# Patient Record
Sex: Female | Born: 1945 | Race: White | Hispanic: No | Marital: Married | Smoking: Never smoker
Health system: Southern US, Community
[De-identification: ages and names within clinical notes are randomized; demographics above are authoritative.]

## PROBLEM LIST (undated history)

## (undated) DIAGNOSIS — J189 Pneumonia, unspecified organism: Secondary | ICD-10-CM

## (undated) DIAGNOSIS — I10 Essential (primary) hypertension: Secondary | ICD-10-CM

## (undated) DIAGNOSIS — U071 COVID-19: Secondary | ICD-10-CM

## (undated) HISTORY — DX: Essential (primary) hypertension: I10

## (undated) HISTORY — PX: TOTAL KNEE ARTHROPLASTY: SHX125

---

## 2003-04-01 ENCOUNTER — Ambulatory Visit (HOSPITAL_COMMUNITY): Admission: RE | Admit: 2003-04-01 | Discharge: 2003-04-01 | Payer: Self-pay | Admitting: Family Medicine

## 2003-04-01 ENCOUNTER — Encounter: Payer: Self-pay | Admitting: Family Medicine

## 2004-01-15 ENCOUNTER — Ambulatory Visit (HOSPITAL_COMMUNITY): Admission: RE | Admit: 2004-01-15 | Discharge: 2004-01-16 | Payer: Self-pay | Admitting: Otolaryngology

## 2004-08-18 ENCOUNTER — Ambulatory Visit (HOSPITAL_COMMUNITY): Admission: RE | Admit: 2004-08-18 | Discharge: 2004-08-18 | Payer: Self-pay | Admitting: Family Medicine

## 2004-11-07 ENCOUNTER — Other Ambulatory Visit: Admission: RE | Admit: 2004-11-07 | Discharge: 2004-11-07 | Payer: Self-pay | Admitting: Family Medicine

## 2005-09-19 ENCOUNTER — Ambulatory Visit (HOSPITAL_COMMUNITY): Admission: RE | Admit: 2005-09-19 | Discharge: 2005-09-19 | Payer: Self-pay | Admitting: Family Medicine

## 2005-12-19 ENCOUNTER — Other Ambulatory Visit: Admission: RE | Admit: 2005-12-19 | Discharge: 2005-12-19 | Payer: Self-pay | Admitting: Family Medicine

## 2006-10-12 ENCOUNTER — Ambulatory Visit (HOSPITAL_COMMUNITY): Admission: RE | Admit: 2006-10-12 | Discharge: 2006-10-12 | Payer: Self-pay | Admitting: Family Medicine

## 2007-01-01 ENCOUNTER — Other Ambulatory Visit: Admission: RE | Admit: 2007-01-01 | Discharge: 2007-01-01 | Payer: Self-pay | Admitting: Family Medicine

## 2007-10-16 ENCOUNTER — Ambulatory Visit (HOSPITAL_COMMUNITY): Admission: RE | Admit: 2007-10-16 | Discharge: 2007-10-16 | Payer: Self-pay | Admitting: Family Medicine

## 2008-03-13 ENCOUNTER — Other Ambulatory Visit: Admission: RE | Admit: 2008-03-13 | Discharge: 2008-03-13 | Payer: Self-pay | Admitting: Family Medicine

## 2008-11-09 ENCOUNTER — Ambulatory Visit (HOSPITAL_COMMUNITY): Admission: RE | Admit: 2008-11-09 | Discharge: 2008-11-09 | Payer: Self-pay | Admitting: Family Medicine

## 2009-04-06 ENCOUNTER — Other Ambulatory Visit: Admission: RE | Admit: 2009-04-06 | Discharge: 2009-04-06 | Payer: Self-pay | Admitting: Family Medicine

## 2009-11-10 ENCOUNTER — Ambulatory Visit (HOSPITAL_COMMUNITY): Admission: RE | Admit: 2009-11-10 | Discharge: 2009-11-10 | Payer: Self-pay | Admitting: Family Medicine

## 2010-05-17 ENCOUNTER — Other Ambulatory Visit: Admission: RE | Admit: 2010-05-17 | Discharge: 2010-05-17 | Payer: Self-pay | Admitting: Family Medicine

## 2010-11-21 ENCOUNTER — Other Ambulatory Visit (HOSPITAL_COMMUNITY): Payer: Self-pay | Admitting: Family Medicine

## 2010-11-21 DIAGNOSIS — Z1231 Encounter for screening mammogram for malignant neoplasm of breast: Secondary | ICD-10-CM

## 2010-11-29 ENCOUNTER — Ambulatory Visit (HOSPITAL_COMMUNITY)
Admission: RE | Admit: 2010-11-29 | Discharge: 2010-11-29 | Disposition: A | Discharge status: Home / Self Care | Payer: BC Managed Care – PPO | Source: Ambulatory Visit | Attending: Family Medicine | Admitting: Family Medicine

## 2010-11-29 DIAGNOSIS — Z1231 Encounter for screening mammogram for malignant neoplasm of breast: Secondary | ICD-10-CM | POA: Insufficient documentation

## 2011-02-03 NOTE — Op Note (Signed)
NAME:  Tracy Young, Tracy Young                          ACCOUNT NO.:  [id number]   MEDICAL RECORD NO.:  [id number]                   PATIENT TYPE:  OIB   LOCATION:  5707                                 FACILITY:  [facility]   PHYSICIAN:  Beulah Gandy. Ashley Royalty, M.D.              DATE OF BIRTH:  1946-01-10   DATE OF PROCEDURE:  01/15/2004  DATE OF DISCHARGE:  01/16/2004                                 OPERATIVE REPORT   ADMISSION DIAGNOSIS:  Rhegmatogenous retinal detachment, left eye.  Vitreous hemorrhage, left eye.   PROCEDURES:  1. Scleral buckle, with pars plana vitrectomy.  2. Retinal photocoagulation.  3. Perfluoron injection.  4. Perfluoron removal.  5. Membrane peel.  6. Perfluoron propane injection.  All for left eye.   SURGEON:  Beulah Gandy. Ashley Royalty, M.D.   ASSISTANT:  Bryan Lemma. Lundquist, P.A.   ANESTHESIA:  General.   OPERATIVE DETAILS:  The usual prep and drape, 360 limbal peritomy, isolation  of four rectus muscles on 2-0 silk, sterile dissection for 360 degrees to  admit a 279 intrascleral implant.  Diathermy placed in the bed.  Two sutures  per quad replaced in the scleral flaps.  Additional posterior dissection was  carried out beneath the break at 9 o'clock.  Additional diathermy was placed  in this area.  A 508-G radial segment was placed at 9 o'clock.  The 279  implant was placed with a joint at 1 o'clock.  The 240 band was placed in  the 270 sleeve at 12 o'clock.   Attention was then carried to the pars plana area, where preparation for  vitrectomy was performed.  The 4 mm fusion port was anchored into place at 4  o'clock.  The lighted Pickett cutter was placed at 10 and 2 o'clock  respectively.  Pars plana vitrectomy was begun just behind the crystalline  lens.  The ProDisc was placed on the corneal surface and the Biom was moved  into place.  The vitrectomy was performed by removing all vitreous from  vitreous cavity.  There was vitreous adherent to the edges of the tear,  and  there were blood vessels into the ends of the tears.  Endo cautery was used  to cauterize the torn edges of the retina, where blood vessels were  bleeding.  Once this was accomplished, the Perfluoron was injected into the  eye to reattach the retina; it came up to the edge of the posterior tear.  Then gas was injected in a sandwich maneuver to flatten the retina on the  scleral buckle.   At this point, the indirect ophthalmoscope laser was moved into place; 370  burns were placed on the scleral buckle around the retinal breaks.  The fire  was 700 mW, 1000 mic each and 0.1 to 0.15 sec each.  A total gas-fluid  exchange was then carried out.  Then, once all Perfluoron and fluid was  removed, a  Perfluoron propane and 12% concentration was exchanged for  intravitreal gas.   The instruments were removed from the eye, and 9-0 nylon was used to close  the sclerotomy sites.  The buckle was adjusted and trimmed.  The band was  adjusted and trimmed.  The sutures were knotted and trimmed.  The  conjunctivae was reposited with 7-0 chromic suture.  Polymixin and  gentamicin were irrigated into Tenon's space.  Atropine solution was  applied.  Decadron 10 mg was injected to the lower subconjunctival space.  Marcaine was injected on the globe for postoperative pain.  The closing  tension was 10 with a Barraquer tonometer.   COMPLICATIONS:  None.   DURATION:  Three hours.   DISPOSITION:  Polysporin, a patch and shield were placed.  The patient was  awakened and taken to recovery in satisfactory condition.                                               Beulah Gandy. Ashley Royalty, M.D.    JDM/MEDQ  D:  01/15/2004  T:  01/16/2004  Job:  [id number]

## 2011-05-25 ENCOUNTER — Other Ambulatory Visit: Payer: Self-pay | Admitting: Family Medicine

## 2011-05-25 ENCOUNTER — Other Ambulatory Visit (HOSPITAL_COMMUNITY)
Admission: RE | Admit: 2011-05-25 | Discharge: 2011-05-25 | Disposition: A | Discharge status: Home / Self Care | Payer: BC Managed Care – PPO | Source: Ambulatory Visit | Attending: Family Medicine | Admitting: Family Medicine

## 2011-05-25 DIAGNOSIS — Z01419 Encounter for gynecological examination (general) (routine) without abnormal findings: Secondary | ICD-10-CM | POA: Insufficient documentation

## 2011-05-29 ENCOUNTER — Encounter (HOSPITAL_COMMUNITY)
Admission: RE | Admit: 2011-05-29 | Discharge: 2011-05-29 | Disposition: A | Discharge status: Home / Self Care | Payer: BC Managed Care – PPO | Source: Ambulatory Visit | Attending: Orthopaedic Surgery | Admitting: Orthopaedic Surgery

## 2011-05-29 LAB — DIFFERENTIAL
Basophils Absolute: 0 10*3/uL (ref 0.0–0.1)
Basophils Relative: 0 % (ref 0–1)
Eosinophils Absolute: 0.3 10*3/uL (ref 0.0–0.7)
Eosinophils Relative: 2 % (ref 0–5)
Lymphocytes Relative: 22 % (ref 12–46)
Lymphs Abs: 2.6 10*3/uL (ref 0.7–4.0)
Monocytes Absolute: 1.4 10*3/uL — ABNORMAL HIGH (ref 0.1–1.0)
Monocytes Relative: 12 % (ref 3–12)
Neutro Abs: 7.4 10*3/uL (ref 1.7–7.7)
Neutrophils Relative %: 63 % (ref 43–77)

## 2011-05-29 LAB — BASIC METABOLIC PANEL
BUN: 10 mg/dL (ref 6–23)
CO2: 27 mEq/L (ref 19–32)
Calcium: 9.8 mg/dL (ref 8.4–10.5)
Chloride: 107 mEq/L (ref 96–112)
Creatinine, Ser: 1 mg/dL (ref 0.50–1.10)
GFR calc Af Amer: >60 mL/min (ref >60)
GFR calc non Af Amer: 56 mL/min — ABNORMAL LOW (ref >60)
Glucose, Bld: 92 mg/dL (ref 70–99)
Potassium: 4.8 mEq/L (ref 3.5–5.1)
Sodium: 143 mEq/L (ref 135–145)

## 2011-05-29 LAB — CBC
HCT: 30.7 % — ABNORMAL LOW (ref 36.0–46.0)
Hemoglobin: 10.6 g/dL — ABNORMAL LOW (ref 12.0–15.0)
MCH: 31.3 pg (ref 26.0–34.0)
MCHC: 34.5 g/dL (ref 30.0–36.0)
MCV: 90.6 fL (ref 78.0–100.0)
Platelets: 233 10*3/uL (ref 150–400)
RBC: 3.39 MIL/uL — ABNORMAL LOW (ref 3.87–5.11)
RDW: 12.5 % (ref 11.5–15.5)
WBC: 11.6 10*3/uL — ABNORMAL HIGH (ref 4.0–10.5)

## 2011-05-29 LAB — APTT: aPTT: 27 seconds (ref 24–37)

## 2011-05-29 LAB — SURGICAL PCR SCREEN
MRSA, PCR: NEGATIVE
Staphylococcus aureus: POSITIVE — AB

## 2011-05-29 LAB — PROTIME-INR
INR: 1.02 (ref 0.00–1.49)
Prothrombin Time: 13.6 seconds (ref 11.6–15.2)

## 2011-05-29 LAB — ABO/RH: ABO/RH(D): O POS

## 2011-06-06 ENCOUNTER — Inpatient Hospital Stay (HOSPITAL_COMMUNITY)
Admission: RE | Admit: 2011-06-06 | Discharge: 2011-06-09 | DRG: 471 | Disposition: A | Discharge status: Skilled Nursing Facility | Payer: BC Managed Care – PPO | Source: Ambulatory Visit | Attending: Orthopaedic Surgery | Admitting: Orthopaedic Surgery

## 2011-06-06 DIAGNOSIS — M1710 Unilateral primary osteoarthritis, unspecified knee: Principal | ICD-10-CM | POA: Diagnosis present

## 2011-06-06 DIAGNOSIS — E7800 Pure hypercholesterolemia, unspecified: Secondary | ICD-10-CM | POA: Diagnosis present

## 2011-06-06 DIAGNOSIS — D649 Anemia, unspecified: Secondary | ICD-10-CM | POA: Diagnosis present

## 2011-06-07 LAB — BASIC METABOLIC PANEL
BUN: 10 mg/dL (ref 6–23)
CO2: 30 mEq/L (ref 19–32)
Calcium: 8.2 mg/dL — ABNORMAL LOW (ref 8.4–10.5)
Chloride: 101 mEq/L (ref 96–112)
Creatinine, Ser: 0.82 mg/dL (ref 0.50–1.10)
GFR calc Af Amer: >60 mL/min (ref >60)
GFR calc non Af Amer: >60 mL/min (ref >60)
Glucose, Bld: 126 mg/dL — ABNORMAL HIGH (ref 70–99)
Potassium: 3.8 mEq/L (ref 3.5–5.1)
Sodium: 136 mEq/L (ref 135–145)

## 2011-06-07 LAB — CBC
HCT: 23.1 % — ABNORMAL LOW (ref 36.0–46.0)
Hemoglobin: 7.8 g/dL — ABNORMAL LOW (ref 12.0–15.0)
MCH: 30.4 pg (ref 26.0–34.0)
MCHC: 33.8 g/dL (ref 30.0–36.0)
MCV: 89.9 fL (ref 78.0–100.0)
Platelets: 271 10*3/uL (ref 150–400)
RBC: 2.57 MIL/uL — ABNORMAL LOW (ref 3.87–5.11)
RDW: 12.1 % (ref 11.5–15.5)
WBC: 12.9 10*3/uL — ABNORMAL HIGH (ref 4.0–10.5)

## 2011-06-07 LAB — PROTIME-INR
INR: 1.16 (ref 0.00–1.49)
Prothrombin Time: 15 seconds (ref 11.6–15.2)

## 2011-06-08 LAB — TYPE AND SCREEN
ABO/RH(D): O POS
Antibody Screen: NEGATIVE
Unit division: 0
Unit division: 0

## 2011-06-08 LAB — CBC
HCT: 26.8 % — ABNORMAL LOW (ref 36.0–46.0)
Hemoglobin: 9.1 g/dL — ABNORMAL LOW (ref 12.0–15.0)
MCH: 29.7 pg (ref 26.0–34.0)
MCHC: 34 g/dL (ref 30.0–36.0)
MCV: 87.6 fL (ref 78.0–100.0)
Platelets: 216 10*3/uL (ref 150–400)
RBC: 3.06 MIL/uL — ABNORMAL LOW (ref 3.87–5.11)
RDW: 12.5 % (ref 11.5–15.5)
WBC: 17.1 10*3/uL — ABNORMAL HIGH (ref 4.0–10.5)

## 2011-06-08 LAB — BASIC METABOLIC PANEL
BUN: 6 mg/dL (ref 6–23)
CO2: 30 mEq/L (ref 19–32)
Calcium: 8.5 mg/dL (ref 8.4–10.5)
Chloride: 100 mEq/L (ref 96–112)
Creatinine, Ser: 0.68 mg/dL (ref 0.50–1.10)
GFR calc Af Amer: >60 mL/min (ref >60)
GFR calc non Af Amer: >60 mL/min (ref >60)
Glucose, Bld: 121 mg/dL — ABNORMAL HIGH (ref 70–99)
Potassium: 3.6 mEq/L (ref 3.5–5.1)
Sodium: 136 mEq/L (ref 135–145)

## 2011-06-08 LAB — PROTIME-INR
INR: 1.46 (ref 0.00–1.49)
Prothrombin Time: 18 seconds — ABNORMAL HIGH (ref 11.6–15.2)

## 2011-06-09 LAB — BASIC METABOLIC PANEL
BUN: 8 mg/dL (ref 6–23)
CO2: 31 mEq/L (ref 19–32)
Calcium: 8.4 mg/dL (ref 8.4–10.5)
Chloride: 101 mEq/L (ref 96–112)
Creatinine, Ser: 0.8 mg/dL (ref 0.50–1.10)
GFR calc Af Amer: >60 mL/min (ref >60)
GFR calc non Af Amer: >60 mL/min (ref >60)
Glucose, Bld: 113 mg/dL — ABNORMAL HIGH (ref 70–99)
Potassium: 3.6 mEq/L (ref 3.5–5.1)
Sodium: 139 mEq/L (ref 135–145)

## 2011-06-09 LAB — URINALYSIS, ROUTINE W REFLEX MICROSCOPIC
Bilirubin Urine: NEGATIVE
Glucose, UA: NEGATIVE mg/dL
Hgb urine dipstick: NEGATIVE
Ketones, ur: NEGATIVE mg/dL
Nitrite: NEGATIVE
Protein, ur: NEGATIVE mg/dL
Specific Gravity, Urine: 1.01 (ref 1.005–1.030)
Urobilinogen, UA: 0.2 mg/dL (ref 0.0–1.0)
pH: 6.5 (ref 5.0–8.0)

## 2011-06-09 LAB — CBC
HCT: 26 % — ABNORMAL LOW (ref 36.0–46.0)
Hemoglobin: 9 g/dL — ABNORMAL LOW (ref 12.0–15.0)
MCH: 30.4 pg (ref 26.0–34.0)
MCHC: 34.6 g/dL (ref 30.0–36.0)
MCV: 87.8 fL (ref 78.0–100.0)
Platelets: 248 10*3/uL (ref 150–400)
RBC: 2.96 MIL/uL — ABNORMAL LOW (ref 3.87–5.11)
RDW: 12.5 % (ref 11.5–15.5)
WBC: 17.3 10*3/uL — ABNORMAL HIGH (ref 4.0–10.5)

## 2011-06-09 LAB — URINE MICROSCOPIC-ADD ON

## 2011-06-09 LAB — PROTIME-INR
INR: 1.64 — ABNORMAL HIGH (ref 0.00–1.49)
Prothrombin Time: 19.7 seconds — ABNORMAL HIGH (ref 11.6–15.2)

## 2011-06-09 NOTE — Op Note (Signed)
Tracy Young, Tracy Young                ACCOUNT NO.:  [id number]  MEDICAL RECORD NO.:  [id number]  LOCATION:  2550                         FACILITY:  [facility]  PHYSICIAN:  Lubertha Basque. Milanie Rosenfield, M.D.DATE OF BIRTH:  1946-04-20  DATE OF PROCEDURE:  06/06/2011 DATE OF DISCHARGE:                              OPERATIVE REPORT   PREOPERATIVE DIAGNOSIS:  Bilateral degenerative joint disease, knees.  POSTOPERATIVE DIAGNOSIS:  Bilateral degenerative joint disease, knees.  PROCEDURE:  Bilateral total knee replacement.  ANESTHESIA:  General.  ATTENDING SURGEON:  Lubertha Basque. Jerl Santos, MD  ASSISTANT:  Lindwood Qua, PA   INDICATIONS FOR PROCEDURE:  The patient is a 65 year old woman with a long history of painful knees.  This has persisted despite various oral anti-inflammatories and injectables.  She has bone-on-bone degenerative change in the medial compartment of each knee.  She has pain, which limits her ability to rest and walk and at this point, she is offered knee replacement surgery.  After extensive discussion of risks of staged unilateral versus simultaneous bilateral knee replacement, she has elected to go forward with a bilateral replacements.  She is aware of the increased risk of major complications.  Informed operative consent was obtained after discussion of possible complications including reaction to anesthesia, infection, DVT, PE, and death.  The importance of the postoperative rehabilitation protocol was stressed extensively with the patient.  SUMMARY FINDINGS AND PROCEDURE:  Under general anesthesia, we performed bilateral knee replacements.  We addressed the right side first.  We used identical components on both sides, which were DePuy LCS parts.  We used standard plus femur, 10 deep dish spacer, 38 all-polyethylene patella, and 3 tibial tray on each side.  We did include Zinacef antibiotic in the cement.  Lindwood Qua assisted throughout and was invaluable to the  completion of the cases, in that, he helped position and retract while I performed the procedure.  He also closed simultaneously to help minimize OR time.  She did have advanced degenerative change in the medial compartment of each side with some moderate patellofemoral change, but had excellent bone quality.  DESCRIPTION OF PROCEDURE:  The patient was taken to the operating suite where general anesthetic was applied without difficulty.  She was positioned supine, and prepped and draped in normal sterile fashion. After administration of IV Kefzol, the right leg was elevated, exsanguinated, tourniquet inflated about the thigh.  A longitudinal anterior incision was made with dissection down the extensor mechanism. All appropriate anti-infective measures were used including closed hooded exhaust systems for each member of the surgical team, Betadine impregnated drape, and the preoperative IV antibiotic.  A medial parapatellar incision was made.  The kneecap was flipped and the knee flexed.  Some residual meniscal tissues were removed along with the ACL and PCL.  Some osteophytes were also removed.  We placed an extramedullary guide on the tibia to make a roughly flat cut.  We then placed an intramedullary guide in the femur to make anterior and posterior cuts creating a flexion gap of 10 mm.  A second intramedullary guide was placed in the femur to make a distal cut creating an equal extension gap of 10 mm balancing the knee.  The femur sized to a standard plus and the tibia to 3 with appropriate guide placed and utilized.  The patella was cut down in thickness by about 10 or 11 mm to 15 and sized to 38 with the appropriate guide placed and utilized. Trial reduction was done with all these components in the 10 spacer. She easily came to slight hyperextension and the patella tracked well in flexion.  The trial components removed followed by pulsatile lavage irrigation of all 2 cut bony  surfaces.  Cement was mixed including Zinacef antibiotic and was eventually pressure placed onto the dry bleeding bone surfaces.  The aforementioned DePuy LCS components were placed followed by removal of excess cement.  Pressure was held on the components until the cement had hardened.  The tourniquet was deflated and a small amount of bleeding was easily controlled with Bovie cautery and some pressure.  The wound was irrigated followed by placement of a drain exiting superolaterally.  The extensor mechanism was reapproximated with #1 Vicryl in interrupted fashion followed by subcutaneous reapproximation with 0 and 2-0 undyed Vicryl and skin closure with a running subcuticular stitch of Vicryl.  Steri-Strips were applied followed by a sterile dressing.  At this point, attention was turned to the opposite left knee.  We performed an identical procedure in an identical fashion using an identical parts.  This side was also closed in a similar fashion.  Sterile dressing was applied here.  We also used a drain on the left side.  Estimated blood loss was minimal. Intraoperative fluids obtained from anesthesia records.  DISPOSITION:  The patient was extubated in the operating room and taken to recovery room in stable addition.  She was to be admitted for Orthopedic Surgery Service for appropriate postop care to include perioperative antibiotics and Coumadin plus Lovenox for DVT prophylaxis.     Lubertha Basque Jerl Santos, M.D.     PGD/MEDQ  D:  06/06/2011  T:  06/06/2011  Job:  [id number]  Electronically Signed by Marcene Corning M.D. on 06/09/2011 12:37:19 PM

## 2011-06-10 LAB — URINE CULTURE
Colony Count: 4000
Culture  Setup Time: 201209211143

## 2011-07-13 NOTE — Discharge Summary (Signed)
Tracy Young, Tracy Young                ACCOUNT NO.:  [id number]  MEDICAL RECORD NO.:  [id number]  LOCATION:  5034                         FACILITY:  [facility]  PHYSICIAN:  Lubertha Basque. Nature Kueker, M.D.DATE OF BIRTH:  1946/07/26  DATE OF ADMISSION:  06/06/2011 DATE OF DISCHARGE:                              DISCHARGE SUMMARY   ADMITTING DIAGNOSES: 1. Bilateral knee end-stage degenerative joint disease. 2. Hypercholesterolemia.  DISCHARGE DIAGNOSES: 1. Bilateral knee end-stage degenerative joint disease. 2. Hypercholesterolemia with blood loss anemia.  BRIEF HISTORY:  Tracy Young is a 65 year old who is a patient known to our practice and has been complaining of increasing right and left knee pain when she ambulates and trouble sleeping at night time.  She is not able to walk very far and her activities have been significantly curtailed because of her knee pain.  Her x-rays revealed bone-on-bone end-stage DJD of both knees.  She has had corticosteroid injections, anti-inflammatory medicines and they have only helped temporarily and we have discussed treatment options that being a total knee replacement for the right and left side done by Dr. Jerl Santos  PERTINENT LABORATORY AND X-RAY FINDINGS:  Hemoglobin 9.1, hematocrit 26.8, WBC 17.1 and platelets of 216.  Sodium 136, potassium 3.6, BUN 6, creatinine 0.68, glucose of 121.  Last INR available at the time of this dictation was 1.46.  COURSE IN THE HOSPITAL:  The patient was admitted after bilateral knee replacement surgery.  We then implemented total joint replacement protocol as well as orthopedic p.r.n. orders.  Diet was advanced slowly as tolerated.  The use of PCA Dilaudid pump was also incorporated. Pharmacy was asked to help with DVT prophylaxis with Lovenox and Coumadin.  She also had pulsating air stockings in place that was on appropriate antiemetics, stool softeners.  Foley catheter used initially for the first 24-48 hours and then  discontinued.  She was able to void well on her own.  CPM machines were used simultaneously 0-50 initially 4- 6 hours a day and advanced as tolerated.  She could be out of bed with physical therapy. Weightbearing as tolerated.  She also had appropriate p.o. pain medications as well, had followed her lab studies and she noted that she had a drop in her hemoglobin and she had given 2 units of autologous blood preoperatively which we have given back to her and also incorporated the use of an Autovac and that blood was reinfused as well. First day postop, she was appropriately oriented to speech and behavior. Blood pressure 120/49, temperature 98, positive bowel sounds.  Her abdomen is soft.  Breath sounds in all lung fields.  Foley catheter was left an additional day due to decreased mobility.  She had good neurovascular status in both right and left legs.  Drains were in place. The right side was actively draining.  The left side did not drain and she was also using CPM 0-50 on the day.  Hemoglobin was 7.8, INR 1.16. After consultation with the patient, it was decided to reinfuse her with her own autologous 2 units of blood that she had donated preoperatively. She worked with physical therapy and had gotten out of bed and then taken a few  steps in the room.  The second day postop, her dressings were changed.  Wounds were noted to be benign.  No sign of infection or irritation.  Normal neurovascular status.  Blood pressure 133/54, temperature 98.  She did have a slight increase in her WBCs from 12-17. This will be monitored prior to her leaving the hospital.  At this time, she had no respiratory symptoms, no urinary symptoms whatsoever.  Lungs were clear.  Cardiac regular rate and rhythm.  Abdomen soft, was voiding well on her own without any discomfort or malodor, continued to use her CPM machine 0-55-60 degrees.  Calves were soft and nontender, able to move her ankles and hips relatively  well.  The arrangements were made due to the complexity of doing both knees and having them replaced for placement at the skilled nursing facility here in this facility and she will be discharged there up on Friday.  CONDITION ON DISCHARGE:  Improved.  FOLLOWUP:  She will remain on a low-sodium heart-healthy diet as tolerated.  She will be weightbearing as tolerated with the help of walker, crutches and also can use knee immobilizer as if this is more comfortable for her walking more in bed, did not have to be on the either time, but this is strictly for patient comfort and safety.  I would keep her on a knee heights TED stockings to take them off at nighttime while sleeping.  We may change the dressing 7 days postop and go to gauze and 6 inch ace strep at that point.  She will remain on Coumadin for a total of 4 weeks to call with any sign of infection which would be increasing redness, drainage, increasing pain or discharge, call our office at [phone] for an immediate appointment.  Otherwise, we would see her back at a prescheduled postoperative date for first postop check which was approximately 2 weeks from the date of surgery.  I have completed her med rec sheet and I have given her 4 prescriptions to take with her one as for Coumadin 5 mg dose to be regulated and monitored with an INR between 2.0 and 3.0 for total of 4 weeks.  Second is a Ambien to be taken as needed for sleep.  Third is a Hydrocodone preparation to be taken as needed for pain and then the last one is methocarbamol muscle relaxer for spasm.  Other medicines are outlined on her med rec sheet that she had been on preoperatively and these are essentially unchanged except for discontinuing Celebrex.     Lindwood Qua, P.A.   ______________________________ Lubertha Basque. Jerl Santos, M.D.    MC/MEDQ  D:  06/08/2011  T:  06/08/2011  Job:  [id number]  Electronically Signed by Lindwood Qua P.A. on 06/14/2011  05:29:19 PM Electronically Signed by Marcene Corning M.D. on 07/13/2011 01:25:59 PM

## 2011-07-13 NOTE — H&P (Signed)
  NAMEAFRAH, Tracy Young                ACCOUNT NO.:  [id number]  MEDICAL RECORD NO.:  [id number]  LOCATION:                                 FACILITY:  PHYSICIAN:  Lubertha Basque. Larrie Lucia, M.D.DATE OF BIRTH:  06-Aug-1946  DATE OF ADMISSION:  06/06/2011 DATE OF DISCHARGE:                             HISTORY & PHYSICAL   CHIEF COMPLAINT:  Bilateral knee pain.  HISTORY OF PRESENT ILLNESS:  Tracy Young is a retired patient who we have seen over the course of last year complaining of bilateral knee discomfort really both sides very uncomfortable, trouble sleeping at nighttime, trouble ambulating, and some swelling in reduced motion.  Tracy Young has failed oral nonsteroidal anti-inflammatory drugs, corticosteroid injections, and is having increasing difficulty walking and sleeping. Tracy Young x-rays reveal bone-on-bone end-stage DJD of both knees.  We have discussed treatment options with Tracy Young that being total knee replacement with the risks of anesthesia, infection, DVT, and possible death.  PAST MEDICAL HISTORY:  Tracy Young is not a diabetic.  ALLERGIES:  Tracy Young has no drug allergies.  REVIEW OF SYSTEMS:  Fourteen-point review of systems is positive for history of sleep difficulty, corrective lenses, and seasonal allergies.  CURRENT MEDICATION:  Wellbutrin, Evista, Crestor, Ambien, VersaMist, and Advair.  SURGICAL HISTORY:  Tracy Young has had eye surgery.  PCP medical coverage is through Dr. Elias Else.  FAMILY HISTORY:  Negative for diabetes.  Negative for hypertension. Negative for heart disease.  Positive for arthritis.  SOCIAL HISTORY:  Tracy Young is married, retired from [employer].  Tracy Young does not smoke or drink alcohol.  PHYSICAL EXAMINATION:  VITAL SIGNS:  Stable.  Temperature is 98, pulse 72, respirations 16. GENERAL:  No apparent acute distress, walks with a slight antalgic gait, well-developed, well-nourished. HEENT:  Eyes:  PERRLA.  No oropharynx obstructions noted.  No cervical adenopathy.  Full  motion.  No JVD. LUNGS:  Clear to A and P. CARDIAC:  Regular rate and rhythm. ABDOMEN:  Positive bowel sounds.  Negative guarding.  No spleen or liver enlargement.  No masses noted. UPPER EXTREMITY:  Motion of all joints full and pain-free.  Lower extremity skin and neurovascular status is intact in both lower extremities.  Hip motion, ankle motion are full and pain-free.  No pretibial edema noted.  Knee motion bilaterally are 0-1/10.  There are no scars.  There is no significant effusion.  Tracy Young does have mild crepitation at 1+ and some medial joint line pain.  Sensory and motor function are intact with palpable pulses.  ASSESSMENT: 1. Bilateral knee end-stage degenerative joint disease. 2. Hypercholesterolemia.  PLAN:  Replace both of Tracy Young knees and then admit to the hospital for pain control, physical therapy, and then make a determination whether Tracy Young would be able to go home with home health care versus skilled nursing facility placement for physical therapy as well.     Lindwood Qua, P.A.   ______________________________ Lubertha Basque. Jerl Santos, M.D.    MC/MEDQ  D:  06/05/2011  T:  06/05/2011  Job:  [id number]  Electronically Signed by Lindwood Qua P.A. on 06/26/2011 08:44:34 AM Electronically Signed by Marcene Corning M.D. on 07/13/2011 01:26:03 PM

## 2011-09-18 ENCOUNTER — Other Ambulatory Visit: Payer: Self-pay | Admitting: Obstetrics and Gynecology

## 2011-10-23 ENCOUNTER — Ambulatory Visit (INDEPENDENT_AMBULATORY_CARE_PROVIDER_SITE_OTHER): Payer: BC Managed Care – PPO | Admitting: Ophthalmology

## 2011-10-23 DIAGNOSIS — H43819 Vitreous degeneration, unspecified eye: Secondary | ICD-10-CM

## 2011-10-23 DIAGNOSIS — H3530 Unspecified macular degeneration: Secondary | ICD-10-CM

## 2011-10-23 DIAGNOSIS — H33009 Unspecified retinal detachment with retinal break, unspecified eye: Secondary | ICD-10-CM

## 2011-11-03 ENCOUNTER — Other Ambulatory Visit (HOSPITAL_COMMUNITY): Payer: Self-pay | Admitting: Family Medicine

## 2011-11-03 DIAGNOSIS — Z1231 Encounter for screening mammogram for malignant neoplasm of breast: Secondary | ICD-10-CM

## 2011-12-04 ENCOUNTER — Ambulatory Visit (HOSPITAL_COMMUNITY)
Admission: RE | Admit: 2011-12-04 | Discharge: 2011-12-04 | Disposition: A | Discharge status: Home / Self Care | Payer: BC Managed Care – PPO | Source: Ambulatory Visit | Attending: Family Medicine | Admitting: Family Medicine

## 2011-12-04 DIAGNOSIS — Z1231 Encounter for screening mammogram for malignant neoplasm of breast: Secondary | ICD-10-CM | POA: Insufficient documentation

## 2012-10-22 ENCOUNTER — Ambulatory Visit (INDEPENDENT_AMBULATORY_CARE_PROVIDER_SITE_OTHER): Payer: 59 | Admitting: Ophthalmology

## 2012-10-22 DIAGNOSIS — H3530 Unspecified macular degeneration: Secondary | ICD-10-CM

## 2012-10-22 DIAGNOSIS — H33009 Unspecified retinal detachment with retinal break, unspecified eye: Secondary | ICD-10-CM

## 2012-10-22 DIAGNOSIS — H26499 Other secondary cataract, unspecified eye: Secondary | ICD-10-CM

## 2012-10-22 DIAGNOSIS — H43819 Vitreous degeneration, unspecified eye: Secondary | ICD-10-CM

## 2012-10-30 ENCOUNTER — Ambulatory Visit (INDEPENDENT_AMBULATORY_CARE_PROVIDER_SITE_OTHER): Payer: 59 | Admitting: Ophthalmology

## 2012-10-30 DIAGNOSIS — H2700 Aphakia, unspecified eye: Secondary | ICD-10-CM

## 2012-11-20 ENCOUNTER — Other Ambulatory Visit (HOSPITAL_COMMUNITY): Payer: Self-pay | Admitting: Family Medicine

## 2012-11-20 DIAGNOSIS — Z1231 Encounter for screening mammogram for malignant neoplasm of breast: Secondary | ICD-10-CM

## 2012-12-11 ENCOUNTER — Ambulatory Visit (HOSPITAL_COMMUNITY)
Admission: RE | Admit: 2012-12-11 | Discharge: 2012-12-11 | Disposition: A | Discharge status: Home / Self Care | Payer: 59 | Source: Ambulatory Visit | Attending: Family Medicine | Admitting: Family Medicine

## 2012-12-11 DIAGNOSIS — Z1231 Encounter for screening mammogram for malignant neoplasm of breast: Secondary | ICD-10-CM

## 2012-12-19 DIAGNOSIS — J40 Bronchitis, not specified as acute or chronic: Secondary | ICD-10-CM | POA: Diagnosis not present

## 2013-01-08 DIAGNOSIS — J45909 Unspecified asthma, uncomplicated: Secondary | ICD-10-CM | POA: Diagnosis not present

## 2013-01-08 DIAGNOSIS — G479 Sleep disorder, unspecified: Secondary | ICD-10-CM | POA: Diagnosis not present

## 2013-01-08 DIAGNOSIS — K219 Gastro-esophageal reflux disease without esophagitis: Secondary | ICD-10-CM | POA: Diagnosis not present

## 2013-01-08 DIAGNOSIS — F329 Major depressive disorder, single episode, unspecified: Secondary | ICD-10-CM | POA: Diagnosis not present

## 2013-01-08 DIAGNOSIS — J309 Allergic rhinitis, unspecified: Secondary | ICD-10-CM | POA: Diagnosis not present

## 2013-01-08 DIAGNOSIS — F3289 Other specified depressive episodes: Secondary | ICD-10-CM | POA: Diagnosis not present

## 2013-01-08 DIAGNOSIS — E785 Hyperlipidemia, unspecified: Secondary | ICD-10-CM | POA: Diagnosis not present

## 2013-01-16 DIAGNOSIS — E785 Hyperlipidemia, unspecified: Secondary | ICD-10-CM | POA: Diagnosis not present

## 2013-01-16 DIAGNOSIS — Z23 Encounter for immunization: Secondary | ICD-10-CM | POA: Diagnosis not present

## 2013-07-09 DIAGNOSIS — B9789 Other viral agents as the cause of diseases classified elsewhere: Secondary | ICD-10-CM | POA: Diagnosis not present

## 2013-07-09 DIAGNOSIS — J209 Acute bronchitis, unspecified: Secondary | ICD-10-CM | POA: Diagnosis not present

## 2013-08-05 DIAGNOSIS — E785 Hyperlipidemia, unspecified: Secondary | ICD-10-CM | POA: Diagnosis not present

## 2013-08-05 DIAGNOSIS — M899 Disorder of bone, unspecified: Secondary | ICD-10-CM | POA: Diagnosis not present

## 2013-08-05 DIAGNOSIS — J45909 Unspecified asthma, uncomplicated: Secondary | ICD-10-CM | POA: Diagnosis not present

## 2013-08-05 DIAGNOSIS — J309 Allergic rhinitis, unspecified: Secondary | ICD-10-CM | POA: Diagnosis not present

## 2013-08-05 DIAGNOSIS — N183 Chronic kidney disease, stage 3 unspecified: Secondary | ICD-10-CM | POA: Diagnosis not present

## 2013-08-05 DIAGNOSIS — Z23 Encounter for immunization: Secondary | ICD-10-CM | POA: Diagnosis not present

## 2013-08-05 DIAGNOSIS — Z0000 Encounter for general adult medical examination without abnormal findings: Secondary | ICD-10-CM | POA: Diagnosis not present

## 2013-08-05 DIAGNOSIS — K219 Gastro-esophageal reflux disease without esophagitis: Secondary | ICD-10-CM | POA: Diagnosis not present

## 2013-08-05 DIAGNOSIS — G479 Sleep disorder, unspecified: Secondary | ICD-10-CM | POA: Diagnosis not present

## 2013-08-25 DIAGNOSIS — J45909 Unspecified asthma, uncomplicated: Secondary | ICD-10-CM | POA: Diagnosis not present

## 2013-09-01 DIAGNOSIS — M899 Disorder of bone, unspecified: Secondary | ICD-10-CM | POA: Diagnosis not present

## 2013-09-01 DIAGNOSIS — M25569 Pain in unspecified knee: Secondary | ICD-10-CM | POA: Diagnosis not present

## 2013-10-06 DIAGNOSIS — H338 Other retinal detachments: Secondary | ICD-10-CM | POA: Diagnosis not present

## 2013-10-30 ENCOUNTER — Ambulatory Visit (INDEPENDENT_AMBULATORY_CARE_PROVIDER_SITE_OTHER): Payer: Medicare Other | Admitting: Ophthalmology

## 2013-10-30 DIAGNOSIS — H43819 Vitreous degeneration, unspecified eye: Secondary | ICD-10-CM

## 2013-10-30 DIAGNOSIS — H3530 Unspecified macular degeneration: Secondary | ICD-10-CM | POA: Diagnosis not present

## 2013-10-30 DIAGNOSIS — H33009 Unspecified retinal detachment with retinal break, unspecified eye: Secondary | ICD-10-CM | POA: Diagnosis not present

## 2014-03-04 ENCOUNTER — Other Ambulatory Visit (HOSPITAL_COMMUNITY): Payer: Self-pay | Admitting: Family Medicine

## 2014-03-04 DIAGNOSIS — Z1231 Encounter for screening mammogram for malignant neoplasm of breast: Secondary | ICD-10-CM

## 2014-03-04 DIAGNOSIS — G479 Sleep disorder, unspecified: Secondary | ICD-10-CM | POA: Diagnosis not present

## 2014-03-04 DIAGNOSIS — E785 Hyperlipidemia, unspecified: Secondary | ICD-10-CM | POA: Diagnosis not present

## 2014-03-04 DIAGNOSIS — K219 Gastro-esophageal reflux disease without esophagitis: Secondary | ICD-10-CM | POA: Diagnosis not present

## 2014-03-04 DIAGNOSIS — IMO0002 Reserved for concepts with insufficient information to code with codable children: Secondary | ICD-10-CM | POA: Diagnosis not present

## 2014-03-05 ENCOUNTER — Ambulatory Visit (HOSPITAL_COMMUNITY)
Admission: RE | Admit: 2014-03-05 | Discharge: 2014-03-05 | Disposition: A | Discharge status: Home / Self Care | Payer: Medicare Other | Source: Ambulatory Visit | Attending: Family Medicine | Admitting: Family Medicine

## 2014-03-05 DIAGNOSIS — Z1231 Encounter for screening mammogram for malignant neoplasm of breast: Secondary | ICD-10-CM | POA: Diagnosis not present

## 2014-05-05 DIAGNOSIS — Z23 Encounter for immunization: Secondary | ICD-10-CM | POA: Diagnosis not present

## 2014-05-22 DIAGNOSIS — M25569 Pain in unspecified knee: Secondary | ICD-10-CM | POA: Diagnosis not present

## 2014-10-30 ENCOUNTER — Ambulatory Visit (INDEPENDENT_AMBULATORY_CARE_PROVIDER_SITE_OTHER): Payer: Medicare Other | Admitting: Ophthalmology

## 2014-10-30 DIAGNOSIS — H4423 Degenerative myopia, bilateral: Secondary | ICD-10-CM | POA: Diagnosis not present

## 2014-10-30 DIAGNOSIS — H338 Other retinal detachments: Secondary | ICD-10-CM | POA: Diagnosis not present

## 2014-10-30 DIAGNOSIS — H35372 Puckering of macula, left eye: Secondary | ICD-10-CM | POA: Diagnosis not present

## 2014-11-02 ENCOUNTER — Ambulatory Visit (INDEPENDENT_AMBULATORY_CARE_PROVIDER_SITE_OTHER): Payer: Medicare Other | Admitting: Ophthalmology

## 2014-11-10 DIAGNOSIS — H26492 Other secondary cataract, left eye: Secondary | ICD-10-CM | POA: Diagnosis not present

## 2014-11-11 ENCOUNTER — Other Ambulatory Visit: Payer: Self-pay | Admitting: Family Medicine

## 2014-11-11 ENCOUNTER — Other Ambulatory Visit (HOSPITAL_COMMUNITY)
Admission: RE | Admit: 2014-11-11 | Discharge: 2014-11-11 | Disposition: A | Discharge status: Home / Self Care | Payer: Medicare Other | Source: Ambulatory Visit | Attending: Family Medicine | Admitting: Family Medicine

## 2014-11-11 DIAGNOSIS — K219 Gastro-esophageal reflux disease without esophagitis: Secondary | ICD-10-CM | POA: Diagnosis not present

## 2014-11-11 DIAGNOSIS — Z01419 Encounter for gynecological examination (general) (routine) without abnormal findings: Secondary | ICD-10-CM | POA: Insufficient documentation

## 2014-11-11 DIAGNOSIS — N183 Chronic kidney disease, stage 3 (moderate): Secondary | ICD-10-CM | POA: Diagnosis not present

## 2014-11-11 DIAGNOSIS — F324 Major depressive disorder, single episode, in partial remission: Secondary | ICD-10-CM | POA: Diagnosis not present

## 2014-11-11 DIAGNOSIS — E782 Mixed hyperlipidemia: Secondary | ICD-10-CM | POA: Diagnosis not present

## 2014-11-11 DIAGNOSIS — G4700 Insomnia, unspecified: Secondary | ICD-10-CM | POA: Diagnosis not present

## 2014-11-11 DIAGNOSIS — J4530 Mild persistent asthma, uncomplicated: Secondary | ICD-10-CM | POA: Diagnosis not present

## 2014-11-11 DIAGNOSIS — Z0000 Encounter for general adult medical examination without abnormal findings: Secondary | ICD-10-CM | POA: Diagnosis not present

## 2014-11-11 DIAGNOSIS — M8580 Other specified disorders of bone density and structure, unspecified site: Secondary | ICD-10-CM | POA: Diagnosis not present

## 2014-11-13 LAB — CYTOLOGY - PAP

## 2014-11-16 DIAGNOSIS — Z23 Encounter for immunization: Secondary | ICD-10-CM | POA: Diagnosis not present

## 2015-03-31 ENCOUNTER — Other Ambulatory Visit (HOSPITAL_COMMUNITY): Payer: Self-pay | Admitting: Family Medicine

## 2015-03-31 DIAGNOSIS — Z1231 Encounter for screening mammogram for malignant neoplasm of breast: Secondary | ICD-10-CM

## 2015-04-21 ENCOUNTER — Other Ambulatory Visit (HOSPITAL_COMMUNITY): Payer: Self-pay | Admitting: Family Medicine

## 2015-04-21 ENCOUNTER — Ambulatory Visit (HOSPITAL_COMMUNITY)
Admission: RE | Admit: 2015-04-21 | Discharge: 2015-04-21 | Disposition: A | Discharge status: Home / Self Care | Payer: Medicare Other | Source: Ambulatory Visit | Attending: Family Medicine | Admitting: Family Medicine

## 2015-04-21 DIAGNOSIS — Z1231 Encounter for screening mammogram for malignant neoplasm of breast: Secondary | ICD-10-CM | POA: Insufficient documentation

## 2015-05-18 DIAGNOSIS — B373 Candidiasis of vulva and vagina: Secondary | ICD-10-CM | POA: Diagnosis not present

## 2015-05-18 DIAGNOSIS — Z1389 Encounter for screening for other disorder: Secondary | ICD-10-CM | POA: Diagnosis not present

## 2015-05-18 DIAGNOSIS — K219 Gastro-esophageal reflux disease without esophagitis: Secondary | ICD-10-CM | POA: Diagnosis not present

## 2015-05-18 DIAGNOSIS — G4700 Insomnia, unspecified: Secondary | ICD-10-CM | POA: Diagnosis not present

## 2015-05-18 DIAGNOSIS — F324 Major depressive disorder, single episode, in partial remission: Secondary | ICD-10-CM | POA: Diagnosis not present

## 2015-05-18 DIAGNOSIS — J4530 Mild persistent asthma, uncomplicated: Secondary | ICD-10-CM | POA: Diagnosis not present

## 2015-05-18 DIAGNOSIS — Z23 Encounter for immunization: Secondary | ICD-10-CM | POA: Diagnosis not present

## 2015-05-18 DIAGNOSIS — E782 Mixed hyperlipidemia: Secondary | ICD-10-CM | POA: Diagnosis not present

## 2015-05-18 DIAGNOSIS — N183 Chronic kidney disease, stage 3 (moderate): Secondary | ICD-10-CM | POA: Diagnosis not present

## 2015-05-19 DIAGNOSIS — Z09 Encounter for follow-up examination after completed treatment for conditions other than malignant neoplasm: Secondary | ICD-10-CM | POA: Diagnosis not present

## 2015-05-19 DIAGNOSIS — Z96653 Presence of artificial knee joint, bilateral: Secondary | ICD-10-CM | POA: Diagnosis not present

## 2015-11-02 DIAGNOSIS — J0100 Acute maxillary sinusitis, unspecified: Secondary | ICD-10-CM | POA: Diagnosis not present

## 2015-11-23 DIAGNOSIS — M8580 Other specified disorders of bone density and structure, unspecified site: Secondary | ICD-10-CM | POA: Diagnosis not present

## 2015-11-23 DIAGNOSIS — G4700 Insomnia, unspecified: Secondary | ICD-10-CM | POA: Diagnosis not present

## 2015-11-23 DIAGNOSIS — K219 Gastro-esophageal reflux disease without esophagitis: Secondary | ICD-10-CM | POA: Diagnosis not present

## 2015-11-23 DIAGNOSIS — J4530 Mild persistent asthma, uncomplicated: Secondary | ICD-10-CM | POA: Diagnosis not present

## 2015-11-23 DIAGNOSIS — N183 Chronic kidney disease, stage 3 (moderate): Secondary | ICD-10-CM | POA: Diagnosis not present

## 2015-11-23 DIAGNOSIS — F324 Major depressive disorder, single episode, in partial remission: Secondary | ICD-10-CM | POA: Diagnosis not present

## 2015-11-23 DIAGNOSIS — J309 Allergic rhinitis, unspecified: Secondary | ICD-10-CM | POA: Diagnosis not present

## 2015-11-23 DIAGNOSIS — Z0000 Encounter for general adult medical examination without abnormal findings: Secondary | ICD-10-CM | POA: Diagnosis not present

## 2015-11-23 DIAGNOSIS — E782 Mixed hyperlipidemia: Secondary | ICD-10-CM | POA: Diagnosis not present

## 2015-12-02 DIAGNOSIS — J101 Influenza due to other identified influenza virus with other respiratory manifestations: Secondary | ICD-10-CM | POA: Diagnosis not present

## 2015-12-23 DIAGNOSIS — H338 Other retinal detachments: Secondary | ICD-10-CM | POA: Diagnosis not present

## 2016-01-04 DIAGNOSIS — J069 Acute upper respiratory infection, unspecified: Secondary | ICD-10-CM | POA: Diagnosis not present

## 2016-01-04 DIAGNOSIS — J4540 Moderate persistent asthma, uncomplicated: Secondary | ICD-10-CM | POA: Diagnosis not present

## 2016-01-06 DIAGNOSIS — M859 Disorder of bone density and structure, unspecified: Secondary | ICD-10-CM | POA: Diagnosis not present

## 2016-01-06 DIAGNOSIS — M810 Age-related osteoporosis without current pathological fracture: Secondary | ICD-10-CM | POA: Diagnosis not present

## 2016-03-08 DIAGNOSIS — M25562 Pain in left knee: Secondary | ICD-10-CM | POA: Diagnosis not present

## 2016-03-27 ENCOUNTER — Other Ambulatory Visit: Payer: Self-pay | Admitting: Internal Medicine

## 2016-03-27 DIAGNOSIS — Z1231 Encounter for screening mammogram for malignant neoplasm of breast: Secondary | ICD-10-CM

## 2016-04-04 DIAGNOSIS — M25562 Pain in left knee: Secondary | ICD-10-CM | POA: Diagnosis not present

## 2016-04-26 ENCOUNTER — Ambulatory Visit
Admission: RE | Admit: 2016-04-26 | Discharge: 2016-04-26 | Disposition: A | Discharge status: Home / Self Care | Payer: Medicare Other | Source: Ambulatory Visit | Attending: Internal Medicine | Admitting: Internal Medicine

## 2016-04-26 DIAGNOSIS — M25561 Pain in right knee: Secondary | ICD-10-CM | POA: Diagnosis not present

## 2016-04-26 DIAGNOSIS — M25562 Pain in left knee: Secondary | ICD-10-CM | POA: Diagnosis not present

## 2016-04-26 DIAGNOSIS — Z1231 Encounter for screening mammogram for malignant neoplasm of breast: Secondary | ICD-10-CM

## 2016-05-01 DIAGNOSIS — M25562 Pain in left knee: Secondary | ICD-10-CM | POA: Diagnosis not present

## 2016-05-04 DIAGNOSIS — M25562 Pain in left knee: Secondary | ICD-10-CM | POA: Diagnosis not present

## 2016-05-09 DIAGNOSIS — M25562 Pain in left knee: Secondary | ICD-10-CM | POA: Diagnosis not present

## 2016-05-12 DIAGNOSIS — M25562 Pain in left knee: Secondary | ICD-10-CM | POA: Diagnosis not present

## 2016-05-16 DIAGNOSIS — M25562 Pain in left knee: Secondary | ICD-10-CM | POA: Diagnosis not present

## 2016-05-19 DIAGNOSIS — M25562 Pain in left knee: Secondary | ICD-10-CM | POA: Diagnosis not present

## 2016-05-23 DIAGNOSIS — M25562 Pain in left knee: Secondary | ICD-10-CM | POA: Diagnosis not present

## 2016-05-25 DIAGNOSIS — M25562 Pain in left knee: Secondary | ICD-10-CM | POA: Diagnosis not present

## 2016-05-30 DIAGNOSIS — K219 Gastro-esophageal reflux disease without esophagitis: Secondary | ICD-10-CM | POA: Diagnosis not present

## 2016-05-30 DIAGNOSIS — N183 Chronic kidney disease, stage 3 (moderate): Secondary | ICD-10-CM | POA: Diagnosis not present

## 2016-05-30 DIAGNOSIS — Z23 Encounter for immunization: Secondary | ICD-10-CM | POA: Diagnosis not present

## 2016-05-30 DIAGNOSIS — E782 Mixed hyperlipidemia: Secondary | ICD-10-CM | POA: Diagnosis not present

## 2016-05-30 DIAGNOSIS — M25562 Pain in left knee: Secondary | ICD-10-CM | POA: Diagnosis not present

## 2016-05-30 DIAGNOSIS — F324 Major depressive disorder, single episode, in partial remission: Secondary | ICD-10-CM | POA: Diagnosis not present

## 2016-05-30 DIAGNOSIS — J4530 Mild persistent asthma, uncomplicated: Secondary | ICD-10-CM | POA: Diagnosis not present

## 2016-05-30 DIAGNOSIS — G4700 Insomnia, unspecified: Secondary | ICD-10-CM | POA: Diagnosis not present

## 2016-06-02 DIAGNOSIS — M25562 Pain in left knee: Secondary | ICD-10-CM | POA: Diagnosis not present

## 2016-06-08 DIAGNOSIS — E782 Mixed hyperlipidemia: Secondary | ICD-10-CM | POA: Diagnosis not present

## 2016-06-08 DIAGNOSIS — N183 Chronic kidney disease, stage 3 (moderate): Secondary | ICD-10-CM | POA: Diagnosis not present

## 2016-06-09 DIAGNOSIS — M25562 Pain in left knee: Secondary | ICD-10-CM | POA: Diagnosis not present

## 2016-06-26 DIAGNOSIS — M25561 Pain in right knee: Secondary | ICD-10-CM | POA: Diagnosis not present

## 2016-06-26 DIAGNOSIS — M25562 Pain in left knee: Secondary | ICD-10-CM | POA: Diagnosis not present

## 2016-06-26 DIAGNOSIS — Z09 Encounter for follow-up examination after completed treatment for conditions other than malignant neoplasm: Secondary | ICD-10-CM | POA: Diagnosis not present

## 2016-06-26 DIAGNOSIS — Z96653 Presence of artificial knee joint, bilateral: Secondary | ICD-10-CM | POA: Diagnosis not present

## 2016-07-12 DIAGNOSIS — M9903 Segmental and somatic dysfunction of lumbar region: Secondary | ICD-10-CM | POA: Diagnosis not present

## 2016-07-12 DIAGNOSIS — M5431 Sciatica, right side: Secondary | ICD-10-CM | POA: Diagnosis not present

## 2016-07-13 DIAGNOSIS — M5431 Sciatica, right side: Secondary | ICD-10-CM | POA: Diagnosis not present

## 2016-07-13 DIAGNOSIS — M9903 Segmental and somatic dysfunction of lumbar region: Secondary | ICD-10-CM | POA: Diagnosis not present

## 2016-07-17 DIAGNOSIS — M9903 Segmental and somatic dysfunction of lumbar region: Secondary | ICD-10-CM | POA: Diagnosis not present

## 2016-07-17 DIAGNOSIS — M5431 Sciatica, right side: Secondary | ICD-10-CM | POA: Diagnosis not present

## 2016-07-19 DIAGNOSIS — M9903 Segmental and somatic dysfunction of lumbar region: Secondary | ICD-10-CM | POA: Diagnosis not present

## 2016-07-19 DIAGNOSIS — M5431 Sciatica, right side: Secondary | ICD-10-CM | POA: Diagnosis not present

## 2016-07-20 DIAGNOSIS — M9903 Segmental and somatic dysfunction of lumbar region: Secondary | ICD-10-CM | POA: Diagnosis not present

## 2016-07-20 DIAGNOSIS — M5431 Sciatica, right side: Secondary | ICD-10-CM | POA: Diagnosis not present

## 2016-07-24 DIAGNOSIS — M9903 Segmental and somatic dysfunction of lumbar region: Secondary | ICD-10-CM | POA: Diagnosis not present

## 2016-07-24 DIAGNOSIS — M5431 Sciatica, right side: Secondary | ICD-10-CM | POA: Diagnosis not present

## 2016-07-25 DIAGNOSIS — M5431 Sciatica, right side: Secondary | ICD-10-CM | POA: Diagnosis not present

## 2016-07-25 DIAGNOSIS — M9903 Segmental and somatic dysfunction of lumbar region: Secondary | ICD-10-CM | POA: Diagnosis not present

## 2016-07-27 DIAGNOSIS — M5431 Sciatica, right side: Secondary | ICD-10-CM | POA: Diagnosis not present

## 2016-07-27 DIAGNOSIS — M9903 Segmental and somatic dysfunction of lumbar region: Secondary | ICD-10-CM | POA: Diagnosis not present

## 2016-07-31 DIAGNOSIS — M9903 Segmental and somatic dysfunction of lumbar region: Secondary | ICD-10-CM | POA: Diagnosis not present

## 2016-07-31 DIAGNOSIS — M5431 Sciatica, right side: Secondary | ICD-10-CM | POA: Diagnosis not present

## 2016-08-02 DIAGNOSIS — M5431 Sciatica, right side: Secondary | ICD-10-CM | POA: Diagnosis not present

## 2016-08-02 DIAGNOSIS — M9903 Segmental and somatic dysfunction of lumbar region: Secondary | ICD-10-CM | POA: Diagnosis not present

## 2016-08-07 DIAGNOSIS — M5431 Sciatica, right side: Secondary | ICD-10-CM | POA: Diagnosis not present

## 2016-08-07 DIAGNOSIS — M9903 Segmental and somatic dysfunction of lumbar region: Secondary | ICD-10-CM | POA: Diagnosis not present

## 2016-08-09 DIAGNOSIS — M5431 Sciatica, right side: Secondary | ICD-10-CM | POA: Diagnosis not present

## 2016-08-09 DIAGNOSIS — M9903 Segmental and somatic dysfunction of lumbar region: Secondary | ICD-10-CM | POA: Diagnosis not present

## 2016-08-14 DIAGNOSIS — M5431 Sciatica, right side: Secondary | ICD-10-CM | POA: Diagnosis not present

## 2016-08-14 DIAGNOSIS — M9903 Segmental and somatic dysfunction of lumbar region: Secondary | ICD-10-CM | POA: Diagnosis not present

## 2016-08-21 DIAGNOSIS — M9903 Segmental and somatic dysfunction of lumbar region: Secondary | ICD-10-CM | POA: Diagnosis not present

## 2016-08-21 DIAGNOSIS — M5431 Sciatica, right side: Secondary | ICD-10-CM | POA: Diagnosis not present

## 2016-08-24 DIAGNOSIS — M5431 Sciatica, right side: Secondary | ICD-10-CM | POA: Diagnosis not present

## 2016-08-24 DIAGNOSIS — M9903 Segmental and somatic dysfunction of lumbar region: Secondary | ICD-10-CM | POA: Diagnosis not present

## 2016-08-28 DIAGNOSIS — M9903 Segmental and somatic dysfunction of lumbar region: Secondary | ICD-10-CM | POA: Diagnosis not present

## 2016-08-28 DIAGNOSIS — M5431 Sciatica, right side: Secondary | ICD-10-CM | POA: Diagnosis not present

## 2016-09-05 DIAGNOSIS — M5431 Sciatica, right side: Secondary | ICD-10-CM | POA: Diagnosis not present

## 2016-09-05 DIAGNOSIS — M9903 Segmental and somatic dysfunction of lumbar region: Secondary | ICD-10-CM | POA: Diagnosis not present

## 2016-09-13 DIAGNOSIS — R21 Rash and other nonspecific skin eruption: Secondary | ICD-10-CM | POA: Diagnosis not present

## 2016-09-14 DIAGNOSIS — M5431 Sciatica, right side: Secondary | ICD-10-CM | POA: Diagnosis not present

## 2016-09-14 DIAGNOSIS — M9903 Segmental and somatic dysfunction of lumbar region: Secondary | ICD-10-CM | POA: Diagnosis not present

## 2016-10-03 DIAGNOSIS — M5431 Sciatica, right side: Secondary | ICD-10-CM | POA: Diagnosis not present

## 2016-10-03 DIAGNOSIS — M9903 Segmental and somatic dysfunction of lumbar region: Secondary | ICD-10-CM | POA: Diagnosis not present

## 2016-10-24 DIAGNOSIS — M5431 Sciatica, right side: Secondary | ICD-10-CM | POA: Diagnosis not present

## 2016-10-24 DIAGNOSIS — M9903 Segmental and somatic dysfunction of lumbar region: Secondary | ICD-10-CM | POA: Diagnosis not present

## 2016-11-02 ENCOUNTER — Ambulatory Visit (INDEPENDENT_AMBULATORY_CARE_PROVIDER_SITE_OTHER): Payer: Medicare Other | Admitting: Ophthalmology

## 2016-11-02 DIAGNOSIS — H43813 Vitreous degeneration, bilateral: Secondary | ICD-10-CM

## 2016-11-02 DIAGNOSIS — H338 Other retinal detachments: Secondary | ICD-10-CM

## 2016-11-02 DIAGNOSIS — H4423 Degenerative myopia, bilateral: Secondary | ICD-10-CM | POA: Diagnosis not present

## 2016-11-13 DIAGNOSIS — M9903 Segmental and somatic dysfunction of lumbar region: Secondary | ICD-10-CM | POA: Diagnosis not present

## 2016-11-13 DIAGNOSIS — M5431 Sciatica, right side: Secondary | ICD-10-CM | POA: Diagnosis not present

## 2016-12-05 DIAGNOSIS — M9903 Segmental and somatic dysfunction of lumbar region: Secondary | ICD-10-CM | POA: Diagnosis not present

## 2016-12-05 DIAGNOSIS — M5431 Sciatica, right side: Secondary | ICD-10-CM | POA: Diagnosis not present

## 2016-12-11 DIAGNOSIS — J028 Acute pharyngitis due to other specified organisms: Secondary | ICD-10-CM | POA: Diagnosis not present

## 2016-12-11 DIAGNOSIS — J111 Influenza due to unidentified influenza virus with other respiratory manifestations: Secondary | ICD-10-CM | POA: Diagnosis not present

## 2016-12-13 DIAGNOSIS — F324 Major depressive disorder, single episode, in partial remission: Secondary | ICD-10-CM | POA: Diagnosis not present

## 2016-12-13 DIAGNOSIS — Z1159 Encounter for screening for other viral diseases: Secondary | ICD-10-CM | POA: Diagnosis not present

## 2016-12-13 DIAGNOSIS — J4530 Mild persistent asthma, uncomplicated: Secondary | ICD-10-CM | POA: Diagnosis not present

## 2016-12-13 DIAGNOSIS — G4700 Insomnia, unspecified: Secondary | ICD-10-CM | POA: Diagnosis not present

## 2016-12-13 DIAGNOSIS — E782 Mixed hyperlipidemia: Secondary | ICD-10-CM | POA: Diagnosis not present

## 2016-12-13 DIAGNOSIS — Z1211 Encounter for screening for malignant neoplasm of colon: Secondary | ICD-10-CM | POA: Diagnosis not present

## 2016-12-13 DIAGNOSIS — N183 Chronic kidney disease, stage 3 (moderate): Secondary | ICD-10-CM | POA: Diagnosis not present

## 2016-12-13 DIAGNOSIS — Z0000 Encounter for general adult medical examination without abnormal findings: Secondary | ICD-10-CM | POA: Diagnosis not present

## 2016-12-13 DIAGNOSIS — K219 Gastro-esophageal reflux disease without esophagitis: Secondary | ICD-10-CM | POA: Diagnosis not present

## 2016-12-19 DIAGNOSIS — J029 Acute pharyngitis, unspecified: Secondary | ICD-10-CM | POA: Diagnosis not present

## 2016-12-26 DIAGNOSIS — M9903 Segmental and somatic dysfunction of lumbar region: Secondary | ICD-10-CM | POA: Diagnosis not present

## 2016-12-26 DIAGNOSIS — M5431 Sciatica, right side: Secondary | ICD-10-CM | POA: Diagnosis not present

## 2017-01-09 DIAGNOSIS — D72829 Elevated white blood cell count, unspecified: Secondary | ICD-10-CM | POA: Diagnosis not present

## 2017-01-11 DIAGNOSIS — D126 Benign neoplasm of colon, unspecified: Secondary | ICD-10-CM | POA: Diagnosis not present

## 2017-01-23 DIAGNOSIS — H26492 Other secondary cataract, left eye: Secondary | ICD-10-CM | POA: Diagnosis not present

## 2017-01-23 DIAGNOSIS — M5431 Sciatica, right side: Secondary | ICD-10-CM | POA: Diagnosis not present

## 2017-01-23 DIAGNOSIS — M9903 Segmental and somatic dysfunction of lumbar region: Secondary | ICD-10-CM | POA: Diagnosis not present

## 2017-03-06 DIAGNOSIS — Z86010 Personal history of colonic polyps: Secondary | ICD-10-CM | POA: Diagnosis not present

## 2017-03-06 DIAGNOSIS — K648 Other hemorrhoids: Secondary | ICD-10-CM | POA: Diagnosis not present

## 2017-03-06 DIAGNOSIS — K5730 Diverticulosis of large intestine without perforation or abscess without bleeding: Secondary | ICD-10-CM | POA: Diagnosis not present

## 2017-03-19 ENCOUNTER — Other Ambulatory Visit: Payer: Self-pay | Admitting: Family Medicine

## 2017-03-19 DIAGNOSIS — Z1231 Encounter for screening mammogram for malignant neoplasm of breast: Secondary | ICD-10-CM

## 2017-04-02 DIAGNOSIS — M9903 Segmental and somatic dysfunction of lumbar region: Secondary | ICD-10-CM | POA: Diagnosis not present

## 2017-04-02 DIAGNOSIS — M5136 Other intervertebral disc degeneration, lumbar region: Secondary | ICD-10-CM | POA: Diagnosis not present

## 2017-04-05 DIAGNOSIS — M5136 Other intervertebral disc degeneration, lumbar region: Secondary | ICD-10-CM | POA: Diagnosis not present

## 2017-04-05 DIAGNOSIS — M9903 Segmental and somatic dysfunction of lumbar region: Secondary | ICD-10-CM | POA: Diagnosis not present

## 2017-04-09 DIAGNOSIS — M9903 Segmental and somatic dysfunction of lumbar region: Secondary | ICD-10-CM | POA: Diagnosis not present

## 2017-04-09 DIAGNOSIS — M5136 Other intervertebral disc degeneration, lumbar region: Secondary | ICD-10-CM | POA: Diagnosis not present

## 2017-04-12 DIAGNOSIS — M9903 Segmental and somatic dysfunction of lumbar region: Secondary | ICD-10-CM | POA: Diagnosis not present

## 2017-04-12 DIAGNOSIS — M5136 Other intervertebral disc degeneration, lumbar region: Secondary | ICD-10-CM | POA: Diagnosis not present

## 2017-04-16 DIAGNOSIS — M5136 Other intervertebral disc degeneration, lumbar region: Secondary | ICD-10-CM | POA: Diagnosis not present

## 2017-04-16 DIAGNOSIS — M9903 Segmental and somatic dysfunction of lumbar region: Secondary | ICD-10-CM | POA: Diagnosis not present

## 2017-04-19 DIAGNOSIS — M9903 Segmental and somatic dysfunction of lumbar region: Secondary | ICD-10-CM | POA: Diagnosis not present

## 2017-04-19 DIAGNOSIS — M5136 Other intervertebral disc degeneration, lumbar region: Secondary | ICD-10-CM | POA: Diagnosis not present

## 2017-04-26 DIAGNOSIS — M5136 Other intervertebral disc degeneration, lumbar region: Secondary | ICD-10-CM | POA: Diagnosis not present

## 2017-04-26 DIAGNOSIS — M9903 Segmental and somatic dysfunction of lumbar region: Secondary | ICD-10-CM | POA: Diagnosis not present

## 2017-05-07 DIAGNOSIS — M9903 Segmental and somatic dysfunction of lumbar region: Secondary | ICD-10-CM | POA: Diagnosis not present

## 2017-05-07 DIAGNOSIS — M5136 Other intervertebral disc degeneration, lumbar region: Secondary | ICD-10-CM | POA: Diagnosis not present

## 2017-05-10 DIAGNOSIS — M9903 Segmental and somatic dysfunction of lumbar region: Secondary | ICD-10-CM | POA: Diagnosis not present

## 2017-05-10 DIAGNOSIS — M5136 Other intervertebral disc degeneration, lumbar region: Secondary | ICD-10-CM | POA: Diagnosis not present

## 2017-05-14 DIAGNOSIS — M9903 Segmental and somatic dysfunction of lumbar region: Secondary | ICD-10-CM | POA: Diagnosis not present

## 2017-05-14 DIAGNOSIS — M5136 Other intervertebral disc degeneration, lumbar region: Secondary | ICD-10-CM | POA: Diagnosis not present

## 2017-05-15 ENCOUNTER — Ambulatory Visit
Admission: RE | Admit: 2017-05-15 | Discharge: 2017-05-15 | Disposition: A | Discharge status: Home / Self Care | Payer: Medicare Other | Source: Ambulatory Visit | Attending: Family Medicine | Admitting: Family Medicine

## 2017-05-15 DIAGNOSIS — Z1231 Encounter for screening mammogram for malignant neoplasm of breast: Secondary | ICD-10-CM | POA: Diagnosis not present

## 2017-05-22 DIAGNOSIS — M5136 Other intervertebral disc degeneration, lumbar region: Secondary | ICD-10-CM | POA: Diagnosis not present

## 2017-05-22 DIAGNOSIS — M9903 Segmental and somatic dysfunction of lumbar region: Secondary | ICD-10-CM | POA: Diagnosis not present

## 2017-05-28 DIAGNOSIS — M5136 Other intervertebral disc degeneration, lumbar region: Secondary | ICD-10-CM | POA: Diagnosis not present

## 2017-05-28 DIAGNOSIS — M9903 Segmental and somatic dysfunction of lumbar region: Secondary | ICD-10-CM | POA: Diagnosis not present

## 2017-06-04 DIAGNOSIS — M5136 Other intervertebral disc degeneration, lumbar region: Secondary | ICD-10-CM | POA: Diagnosis not present

## 2017-06-04 DIAGNOSIS — M9903 Segmental and somatic dysfunction of lumbar region: Secondary | ICD-10-CM | POA: Diagnosis not present

## 2017-06-18 DIAGNOSIS — M9903 Segmental and somatic dysfunction of lumbar region: Secondary | ICD-10-CM | POA: Diagnosis not present

## 2017-06-18 DIAGNOSIS — M5136 Other intervertebral disc degeneration, lumbar region: Secondary | ICD-10-CM | POA: Diagnosis not present

## 2017-06-24 DIAGNOSIS — Z23 Encounter for immunization: Secondary | ICD-10-CM | POA: Diagnosis not present

## 2017-06-25 DIAGNOSIS — Z96653 Presence of artificial knee joint, bilateral: Secondary | ICD-10-CM | POA: Diagnosis not present

## 2017-06-25 DIAGNOSIS — Z09 Encounter for follow-up examination after completed treatment for conditions other than malignant neoplasm: Secondary | ICD-10-CM | POA: Diagnosis not present

## 2017-06-25 DIAGNOSIS — M25562 Pain in left knee: Secondary | ICD-10-CM | POA: Diagnosis not present

## 2017-06-25 DIAGNOSIS — M25561 Pain in right knee: Secondary | ICD-10-CM | POA: Diagnosis not present

## 2017-07-02 DIAGNOSIS — M5136 Other intervertebral disc degeneration, lumbar region: Secondary | ICD-10-CM | POA: Diagnosis not present

## 2017-07-02 DIAGNOSIS — M9903 Segmental and somatic dysfunction of lumbar region: Secondary | ICD-10-CM | POA: Diagnosis not present

## 2017-07-11 DIAGNOSIS — G629 Polyneuropathy, unspecified: Secondary | ICD-10-CM | POA: Diagnosis not present

## 2017-07-11 DIAGNOSIS — K219 Gastro-esophageal reflux disease without esophagitis: Secondary | ICD-10-CM | POA: Diagnosis not present

## 2017-07-11 DIAGNOSIS — N183 Chronic kidney disease, stage 3 (moderate): Secondary | ICD-10-CM | POA: Diagnosis not present

## 2017-07-11 DIAGNOSIS — J4520 Mild intermittent asthma, uncomplicated: Secondary | ICD-10-CM | POA: Diagnosis not present

## 2017-07-11 DIAGNOSIS — Z6830 Body mass index (BMI) 30.0-30.9, adult: Secondary | ICD-10-CM | POA: Diagnosis not present

## 2017-07-11 DIAGNOSIS — G4700 Insomnia, unspecified: Secondary | ICD-10-CM | POA: Diagnosis not present

## 2017-07-11 DIAGNOSIS — E782 Mixed hyperlipidemia: Secondary | ICD-10-CM | POA: Diagnosis not present

## 2017-07-11 DIAGNOSIS — F324 Major depressive disorder, single episode, in partial remission: Secondary | ICD-10-CM | POA: Diagnosis not present

## 2017-09-13 DIAGNOSIS — M79671 Pain in right foot: Secondary | ICD-10-CM | POA: Diagnosis not present

## 2017-09-13 DIAGNOSIS — M79672 Pain in left foot: Secondary | ICD-10-CM | POA: Diagnosis not present

## 2017-09-28 DIAGNOSIS — M79671 Pain in right foot: Secondary | ICD-10-CM | POA: Diagnosis not present

## 2017-10-11 DIAGNOSIS — J019 Acute sinusitis, unspecified: Secondary | ICD-10-CM | POA: Diagnosis not present

## 2017-10-26 DIAGNOSIS — M79671 Pain in right foot: Secondary | ICD-10-CM | POA: Diagnosis not present

## 2017-10-29 DIAGNOSIS — J0100 Acute maxillary sinusitis, unspecified: Secondary | ICD-10-CM | POA: Diagnosis not present

## 2017-12-04 DIAGNOSIS — M19072 Primary osteoarthritis, left ankle and foot: Secondary | ICD-10-CM | POA: Diagnosis not present

## 2017-12-04 DIAGNOSIS — M19071 Primary osteoarthritis, right ankle and foot: Secondary | ICD-10-CM | POA: Diagnosis not present

## 2017-12-19 DIAGNOSIS — K219 Gastro-esophageal reflux disease without esophagitis: Secondary | ICD-10-CM | POA: Diagnosis not present

## 2017-12-19 DIAGNOSIS — Z6830 Body mass index (BMI) 30.0-30.9, adult: Secondary | ICD-10-CM | POA: Diagnosis not present

## 2017-12-19 DIAGNOSIS — M810 Age-related osteoporosis without current pathological fracture: Secondary | ICD-10-CM | POA: Diagnosis not present

## 2017-12-19 DIAGNOSIS — N183 Chronic kidney disease, stage 3 (moderate): Secondary | ICD-10-CM | POA: Diagnosis not present

## 2017-12-19 DIAGNOSIS — F324 Major depressive disorder, single episode, in partial remission: Secondary | ICD-10-CM | POA: Diagnosis not present

## 2017-12-19 DIAGNOSIS — E781 Pure hyperglyceridemia: Secondary | ICD-10-CM | POA: Diagnosis not present

## 2017-12-19 DIAGNOSIS — G629 Polyneuropathy, unspecified: Secondary | ICD-10-CM | POA: Diagnosis not present

## 2017-12-19 DIAGNOSIS — J4520 Mild intermittent asthma, uncomplicated: Secondary | ICD-10-CM | POA: Diagnosis not present

## 2017-12-19 DIAGNOSIS — E782 Mixed hyperlipidemia: Secondary | ICD-10-CM | POA: Diagnosis not present

## 2017-12-19 DIAGNOSIS — G4700 Insomnia, unspecified: Secondary | ICD-10-CM | POA: Diagnosis not present

## 2017-12-19 DIAGNOSIS — Z0000 Encounter for general adult medical examination without abnormal findings: Secondary | ICD-10-CM | POA: Diagnosis not present

## 2017-12-19 DIAGNOSIS — E2839 Other primary ovarian failure: Secondary | ICD-10-CM | POA: Diagnosis not present

## 2018-01-29 DIAGNOSIS — H35431 Paving stone degeneration of retina, right eye: Secondary | ICD-10-CM | POA: Diagnosis not present

## 2018-04-08 ENCOUNTER — Other Ambulatory Visit: Payer: Self-pay | Admitting: Family Medicine

## 2018-04-08 DIAGNOSIS — Z1231 Encounter for screening mammogram for malignant neoplasm of breast: Secondary | ICD-10-CM

## 2018-05-23 ENCOUNTER — Ambulatory Visit
Admission: RE | Admit: 2018-05-23 | Discharge: 2018-05-23 | Disposition: A | Discharge status: Home / Self Care | Payer: Medicare Other | Source: Ambulatory Visit | Attending: Family Medicine | Admitting: Family Medicine

## 2018-05-23 ENCOUNTER — Ambulatory Visit: Payer: Medicare Other

## 2018-05-23 DIAGNOSIS — Z1231 Encounter for screening mammogram for malignant neoplasm of breast: Secondary | ICD-10-CM

## 2018-06-13 DIAGNOSIS — Z23 Encounter for immunization: Secondary | ICD-10-CM | POA: Diagnosis not present

## 2018-06-25 DIAGNOSIS — G4700 Insomnia, unspecified: Secondary | ICD-10-CM | POA: Diagnosis not present

## 2018-06-25 DIAGNOSIS — Z6830 Body mass index (BMI) 30.0-30.9, adult: Secondary | ICD-10-CM | POA: Diagnosis not present

## 2018-06-25 DIAGNOSIS — N183 Chronic kidney disease, stage 3 (moderate): Secondary | ICD-10-CM | POA: Diagnosis not present

## 2018-06-25 DIAGNOSIS — G629 Polyneuropathy, unspecified: Secondary | ICD-10-CM | POA: Diagnosis not present

## 2018-06-25 DIAGNOSIS — J4520 Mild intermittent asthma, uncomplicated: Secondary | ICD-10-CM | POA: Diagnosis not present

## 2018-06-25 DIAGNOSIS — F324 Major depressive disorder, single episode, in partial remission: Secondary | ICD-10-CM | POA: Diagnosis not present

## 2018-06-25 DIAGNOSIS — E2839 Other primary ovarian failure: Secondary | ICD-10-CM | POA: Diagnosis not present

## 2018-06-25 DIAGNOSIS — K219 Gastro-esophageal reflux disease without esophagitis: Secondary | ICD-10-CM | POA: Diagnosis not present

## 2018-06-25 DIAGNOSIS — M810 Age-related osteoporosis without current pathological fracture: Secondary | ICD-10-CM | POA: Diagnosis not present

## 2018-06-25 DIAGNOSIS — Z23 Encounter for immunization: Secondary | ICD-10-CM | POA: Diagnosis not present

## 2018-06-25 DIAGNOSIS — E782 Mixed hyperlipidemia: Secondary | ICD-10-CM | POA: Diagnosis not present

## 2018-07-30 DIAGNOSIS — M8588 Other specified disorders of bone density and structure, other site: Secondary | ICD-10-CM | POA: Diagnosis not present

## 2018-07-30 DIAGNOSIS — M810 Age-related osteoporosis without current pathological fracture: Secondary | ICD-10-CM | POA: Diagnosis not present

## 2018-07-30 DIAGNOSIS — E2839 Other primary ovarian failure: Secondary | ICD-10-CM | POA: Diagnosis not present

## 2018-10-17 DIAGNOSIS — N760 Acute vaginitis: Secondary | ICD-10-CM | POA: Diagnosis not present

## 2018-10-26 DIAGNOSIS — J209 Acute bronchitis, unspecified: Secondary | ICD-10-CM | POA: Diagnosis not present

## 2018-10-29 ENCOUNTER — Encounter (INDEPENDENT_AMBULATORY_CARE_PROVIDER_SITE_OTHER): Payer: Medicare Other | Admitting: Ophthalmology

## 2018-11-11 ENCOUNTER — Encounter (INDEPENDENT_AMBULATORY_CARE_PROVIDER_SITE_OTHER): Payer: Medicare Other | Admitting: Ophthalmology

## 2018-11-11 DIAGNOSIS — H338 Other retinal detachments: Secondary | ICD-10-CM | POA: Diagnosis not present

## 2018-11-11 DIAGNOSIS — H43813 Vitreous degeneration, bilateral: Secondary | ICD-10-CM

## 2018-11-11 DIAGNOSIS — H4423 Degenerative myopia, bilateral: Secondary | ICD-10-CM | POA: Diagnosis not present

## 2018-12-13 DIAGNOSIS — R300 Dysuria: Secondary | ICD-10-CM | POA: Diagnosis not present

## 2018-12-13 DIAGNOSIS — B373 Candidiasis of vulva and vagina: Secondary | ICD-10-CM | POA: Diagnosis not present

## 2018-12-13 DIAGNOSIS — B372 Candidiasis of skin and nail: Secondary | ICD-10-CM | POA: Diagnosis not present

## 2018-12-13 DIAGNOSIS — N3000 Acute cystitis without hematuria: Secondary | ICD-10-CM | POA: Diagnosis not present

## 2018-12-24 DIAGNOSIS — N183 Chronic kidney disease, stage 3 (moderate): Secondary | ICD-10-CM | POA: Diagnosis not present

## 2018-12-24 DIAGNOSIS — M810 Age-related osteoporosis without current pathological fracture: Secondary | ICD-10-CM | POA: Diagnosis not present

## 2018-12-24 DIAGNOSIS — G4700 Insomnia, unspecified: Secondary | ICD-10-CM | POA: Diagnosis not present

## 2018-12-24 DIAGNOSIS — G629 Polyneuropathy, unspecified: Secondary | ICD-10-CM | POA: Diagnosis not present

## 2018-12-24 DIAGNOSIS — K5900 Constipation, unspecified: Secondary | ICD-10-CM | POA: Diagnosis not present

## 2018-12-24 DIAGNOSIS — E782 Mixed hyperlipidemia: Secondary | ICD-10-CM | POA: Diagnosis not present

## 2018-12-24 DIAGNOSIS — K219 Gastro-esophageal reflux disease without esophagitis: Secondary | ICD-10-CM | POA: Diagnosis not present

## 2018-12-24 DIAGNOSIS — Z0000 Encounter for general adult medical examination without abnormal findings: Secondary | ICD-10-CM | POA: Diagnosis not present

## 2018-12-24 DIAGNOSIS — F331 Major depressive disorder, recurrent, moderate: Secondary | ICD-10-CM | POA: Diagnosis not present

## 2018-12-24 DIAGNOSIS — J4520 Mild intermittent asthma, uncomplicated: Secondary | ICD-10-CM | POA: Diagnosis not present

## 2018-12-26 DIAGNOSIS — E782 Mixed hyperlipidemia: Secondary | ICD-10-CM | POA: Diagnosis not present

## 2019-01-06 DIAGNOSIS — R300 Dysuria: Secondary | ICD-10-CM | POA: Diagnosis not present

## 2019-01-06 DIAGNOSIS — N760 Acute vaginitis: Secondary | ICD-10-CM | POA: Diagnosis not present

## 2019-02-14 DIAGNOSIS — H26492 Other secondary cataract, left eye: Secondary | ICD-10-CM | POA: Diagnosis not present

## 2019-04-21 ENCOUNTER — Other Ambulatory Visit: Payer: Self-pay | Admitting: Family Medicine

## 2019-04-21 DIAGNOSIS — Z1231 Encounter for screening mammogram for malignant neoplasm of breast: Secondary | ICD-10-CM

## 2019-05-01 DIAGNOSIS — N390 Urinary tract infection, site not specified: Secondary | ICD-10-CM | POA: Diagnosis not present

## 2019-05-20 DIAGNOSIS — Z23 Encounter for immunization: Secondary | ICD-10-CM | POA: Diagnosis not present

## 2019-06-06 ENCOUNTER — Other Ambulatory Visit: Payer: Self-pay

## 2019-06-06 ENCOUNTER — Ambulatory Visit
Admission: RE | Admit: 2019-06-06 | Discharge: 2019-06-06 | Disposition: A | Discharge status: Home / Self Care | Payer: Medicare Other | Source: Ambulatory Visit | Attending: Family Medicine | Admitting: Family Medicine

## 2019-06-06 DIAGNOSIS — Z1231 Encounter for screening mammogram for malignant neoplasm of breast: Secondary | ICD-10-CM

## 2019-06-23 DIAGNOSIS — E782 Mixed hyperlipidemia: Secondary | ICD-10-CM | POA: Diagnosis not present

## 2019-06-23 DIAGNOSIS — N3020 Other chronic cystitis without hematuria: Secondary | ICD-10-CM | POA: Diagnosis not present

## 2019-06-26 DIAGNOSIS — G629 Polyneuropathy, unspecified: Secondary | ICD-10-CM | POA: Diagnosis not present

## 2019-06-26 DIAGNOSIS — R4702 Dysphasia: Secondary | ICD-10-CM | POA: Diagnosis not present

## 2019-06-26 DIAGNOSIS — G4700 Insomnia, unspecified: Secondary | ICD-10-CM | POA: Diagnosis not present

## 2019-06-26 DIAGNOSIS — K5900 Constipation, unspecified: Secondary | ICD-10-CM | POA: Diagnosis not present

## 2019-06-26 DIAGNOSIS — F331 Major depressive disorder, recurrent, moderate: Secondary | ICD-10-CM | POA: Diagnosis not present

## 2019-06-26 DIAGNOSIS — J4520 Mild intermittent asthma, uncomplicated: Secondary | ICD-10-CM | POA: Diagnosis not present

## 2019-06-26 DIAGNOSIS — E782 Mixed hyperlipidemia: Secondary | ICD-10-CM | POA: Diagnosis not present

## 2019-06-26 DIAGNOSIS — N390 Urinary tract infection, site not specified: Secondary | ICD-10-CM | POA: Diagnosis not present

## 2019-06-26 DIAGNOSIS — K219 Gastro-esophageal reflux disease without esophagitis: Secondary | ICD-10-CM | POA: Diagnosis not present

## 2019-06-26 DIAGNOSIS — M810 Age-related osteoporosis without current pathological fracture: Secondary | ICD-10-CM | POA: Diagnosis not present

## 2019-06-27 ENCOUNTER — Other Ambulatory Visit: Payer: Self-pay | Admitting: Family Medicine

## 2019-06-27 DIAGNOSIS — R4702 Dysphasia: Secondary | ICD-10-CM

## 2019-07-09 ENCOUNTER — Ambulatory Visit
Admission: RE | Admit: 2019-07-09 | Discharge: 2019-07-09 | Disposition: A | Discharge status: Home / Self Care | Payer: Medicare Other | Source: Ambulatory Visit | Attending: Family Medicine | Admitting: Family Medicine

## 2019-07-09 DIAGNOSIS — R4702 Dysphasia: Secondary | ICD-10-CM

## 2019-07-09 DIAGNOSIS — K224 Dyskinesia of esophagus: Secondary | ICD-10-CM | POA: Diagnosis not present

## 2019-07-14 DIAGNOSIS — N3020 Other chronic cystitis without hematuria: Secondary | ICD-10-CM | POA: Diagnosis not present

## 2019-08-22 DIAGNOSIS — R1310 Dysphagia, unspecified: Secondary | ICD-10-CM | POA: Diagnosis not present

## 2019-10-01 DIAGNOSIS — Z1159 Encounter for screening for other viral diseases: Secondary | ICD-10-CM | POA: Diagnosis not present

## 2019-10-06 DIAGNOSIS — K2930 Chronic superficial gastritis without bleeding: Secondary | ICD-10-CM | POA: Diagnosis not present

## 2019-10-06 DIAGNOSIS — K222 Esophageal obstruction: Secondary | ICD-10-CM | POA: Diagnosis not present

## 2019-10-06 DIAGNOSIS — K3189 Other diseases of stomach and duodenum: Secondary | ICD-10-CM | POA: Diagnosis not present

## 2019-10-06 DIAGNOSIS — R1310 Dysphagia, unspecified: Secondary | ICD-10-CM | POA: Diagnosis not present

## 2019-10-08 DIAGNOSIS — K222 Esophageal obstruction: Secondary | ICD-10-CM | POA: Diagnosis not present

## 2019-10-08 DIAGNOSIS — K2930 Chronic superficial gastritis without bleeding: Secondary | ICD-10-CM | POA: Diagnosis not present

## 2019-10-22 DIAGNOSIS — F324 Major depressive disorder, single episode, in partial remission: Secondary | ICD-10-CM | POA: Diagnosis not present

## 2019-10-22 DIAGNOSIS — J4530 Mild persistent asthma, uncomplicated: Secondary | ICD-10-CM | POA: Diagnosis not present

## 2019-10-22 DIAGNOSIS — E782 Mixed hyperlipidemia: Secondary | ICD-10-CM | POA: Diagnosis not present

## 2019-10-22 DIAGNOSIS — J4520 Mild intermittent asthma, uncomplicated: Secondary | ICD-10-CM | POA: Diagnosis not present

## 2019-10-22 DIAGNOSIS — J4540 Moderate persistent asthma, uncomplicated: Secondary | ICD-10-CM | POA: Diagnosis not present

## 2019-10-22 DIAGNOSIS — F325 Major depressive disorder, single episode, in full remission: Secondary | ICD-10-CM | POA: Diagnosis not present

## 2019-10-22 DIAGNOSIS — F331 Major depressive disorder, recurrent, moderate: Secondary | ICD-10-CM | POA: Diagnosis not present

## 2019-10-22 DIAGNOSIS — D509 Iron deficiency anemia, unspecified: Secondary | ICD-10-CM | POA: Diagnosis not present

## 2019-10-22 DIAGNOSIS — M8580 Other specified disorders of bone density and structure, unspecified site: Secondary | ICD-10-CM | POA: Diagnosis not present

## 2019-10-22 DIAGNOSIS — F329 Major depressive disorder, single episode, unspecified: Secondary | ICD-10-CM | POA: Diagnosis not present

## 2019-10-22 DIAGNOSIS — M810 Age-related osteoporosis without current pathological fracture: Secondary | ICD-10-CM | POA: Diagnosis not present

## 2019-12-29 DIAGNOSIS — G4700 Insomnia, unspecified: Secondary | ICD-10-CM | POA: Diagnosis not present

## 2019-12-29 DIAGNOSIS — K5900 Constipation, unspecified: Secondary | ICD-10-CM | POA: Diagnosis not present

## 2019-12-29 DIAGNOSIS — N390 Urinary tract infection, site not specified: Secondary | ICD-10-CM | POA: Diagnosis not present

## 2019-12-29 DIAGNOSIS — B356 Tinea cruris: Secondary | ICD-10-CM | POA: Diagnosis not present

## 2019-12-29 DIAGNOSIS — K219 Gastro-esophageal reflux disease without esophagitis: Secondary | ICD-10-CM | POA: Diagnosis not present

## 2019-12-29 DIAGNOSIS — J4520 Mild intermittent asthma, uncomplicated: Secondary | ICD-10-CM | POA: Diagnosis not present

## 2019-12-29 DIAGNOSIS — M810 Age-related osteoporosis without current pathological fracture: Secondary | ICD-10-CM | POA: Diagnosis not present

## 2019-12-29 DIAGNOSIS — F331 Major depressive disorder, recurrent, moderate: Secondary | ICD-10-CM | POA: Diagnosis not present

## 2019-12-29 DIAGNOSIS — E782 Mixed hyperlipidemia: Secondary | ICD-10-CM | POA: Diagnosis not present

## 2019-12-29 DIAGNOSIS — Z0000 Encounter for general adult medical examination without abnormal findings: Secondary | ICD-10-CM | POA: Diagnosis not present

## 2020-01-19 DIAGNOSIS — N183 Chronic kidney disease, stage 3 unspecified: Secondary | ICD-10-CM | POA: Diagnosis not present

## 2020-02-02 DIAGNOSIS — G4700 Insomnia, unspecified: Secondary | ICD-10-CM | POA: Diagnosis not present

## 2020-02-02 DIAGNOSIS — F325 Major depressive disorder, single episode, in full remission: Secondary | ICD-10-CM | POA: Diagnosis not present

## 2020-02-02 DIAGNOSIS — N183 Chronic kidney disease, stage 3 unspecified: Secondary | ICD-10-CM | POA: Diagnosis not present

## 2020-02-02 DIAGNOSIS — M810 Age-related osteoporosis without current pathological fracture: Secondary | ICD-10-CM | POA: Diagnosis not present

## 2020-02-02 DIAGNOSIS — J4540 Moderate persistent asthma, uncomplicated: Secondary | ICD-10-CM | POA: Diagnosis not present

## 2020-02-02 DIAGNOSIS — F324 Major depressive disorder, single episode, in partial remission: Secondary | ICD-10-CM | POA: Diagnosis not present

## 2020-02-02 DIAGNOSIS — D509 Iron deficiency anemia, unspecified: Secondary | ICD-10-CM | POA: Diagnosis not present

## 2020-02-02 DIAGNOSIS — F331 Major depressive disorder, recurrent, moderate: Secondary | ICD-10-CM | POA: Diagnosis not present

## 2020-02-02 DIAGNOSIS — M8580 Other specified disorders of bone density and structure, unspecified site: Secondary | ICD-10-CM | POA: Diagnosis not present

## 2020-02-02 DIAGNOSIS — E782 Mixed hyperlipidemia: Secondary | ICD-10-CM | POA: Diagnosis not present

## 2020-02-02 DIAGNOSIS — J4520 Mild intermittent asthma, uncomplicated: Secondary | ICD-10-CM | POA: Diagnosis not present

## 2020-02-02 DIAGNOSIS — F329 Major depressive disorder, single episode, unspecified: Secondary | ICD-10-CM | POA: Diagnosis not present

## 2020-04-06 DIAGNOSIS — H35431 Paving stone degeneration of retina, right eye: Secondary | ICD-10-CM | POA: Diagnosis not present

## 2020-04-27 DIAGNOSIS — F325 Major depressive disorder, single episode, in full remission: Secondary | ICD-10-CM | POA: Diagnosis not present

## 2020-04-27 DIAGNOSIS — J4520 Mild intermittent asthma, uncomplicated: Secondary | ICD-10-CM | POA: Diagnosis not present

## 2020-04-27 DIAGNOSIS — N183 Chronic kidney disease, stage 3 unspecified: Secondary | ICD-10-CM | POA: Diagnosis not present

## 2020-04-27 DIAGNOSIS — D509 Iron deficiency anemia, unspecified: Secondary | ICD-10-CM | POA: Diagnosis not present

## 2020-04-27 DIAGNOSIS — F329 Major depressive disorder, single episode, unspecified: Secondary | ICD-10-CM | POA: Diagnosis not present

## 2020-04-27 DIAGNOSIS — G4700 Insomnia, unspecified: Secondary | ICD-10-CM | POA: Diagnosis not present

## 2020-04-27 DIAGNOSIS — F324 Major depressive disorder, single episode, in partial remission: Secondary | ICD-10-CM | POA: Diagnosis not present

## 2020-04-27 DIAGNOSIS — E782 Mixed hyperlipidemia: Secondary | ICD-10-CM | POA: Diagnosis not present

## 2020-04-27 DIAGNOSIS — J4540 Moderate persistent asthma, uncomplicated: Secondary | ICD-10-CM | POA: Diagnosis not present

## 2020-04-27 DIAGNOSIS — F331 Major depressive disorder, recurrent, moderate: Secondary | ICD-10-CM | POA: Diagnosis not present

## 2020-04-27 DIAGNOSIS — J4530 Mild persistent asthma, uncomplicated: Secondary | ICD-10-CM | POA: Diagnosis not present

## 2020-05-30 DIAGNOSIS — N762 Acute vulvitis: Secondary | ICD-10-CM | POA: Diagnosis not present

## 2020-05-30 DIAGNOSIS — N898 Other specified noninflammatory disorders of vagina: Secondary | ICD-10-CM | POA: Diagnosis not present

## 2020-05-30 DIAGNOSIS — R350 Frequency of micturition: Secondary | ICD-10-CM | POA: Diagnosis not present

## 2020-06-19 DIAGNOSIS — Z20822 Contact with and (suspected) exposure to covid-19: Secondary | ICD-10-CM | POA: Diagnosis not present

## 2020-07-05 DIAGNOSIS — F331 Major depressive disorder, recurrent, moderate: Secondary | ICD-10-CM | POA: Diagnosis not present

## 2020-07-05 DIAGNOSIS — K5900 Constipation, unspecified: Secondary | ICD-10-CM | POA: Diagnosis not present

## 2020-07-05 DIAGNOSIS — M810 Age-related osteoporosis without current pathological fracture: Secondary | ICD-10-CM | POA: Diagnosis not present

## 2020-07-05 DIAGNOSIS — B356 Tinea cruris: Secondary | ICD-10-CM | POA: Diagnosis not present

## 2020-07-05 DIAGNOSIS — G4700 Insomnia, unspecified: Secondary | ICD-10-CM | POA: Diagnosis not present

## 2020-07-05 DIAGNOSIS — E782 Mixed hyperlipidemia: Secondary | ICD-10-CM | POA: Diagnosis not present

## 2020-07-05 DIAGNOSIS — J4520 Mild intermittent asthma, uncomplicated: Secondary | ICD-10-CM | POA: Diagnosis not present

## 2020-07-05 DIAGNOSIS — K219 Gastro-esophageal reflux disease without esophagitis: Secondary | ICD-10-CM | POA: Diagnosis not present

## 2020-07-05 DIAGNOSIS — Z23 Encounter for immunization: Secondary | ICD-10-CM | POA: Diagnosis not present

## 2020-08-25 DIAGNOSIS — Z23 Encounter for immunization: Secondary | ICD-10-CM | POA: Diagnosis not present

## 2020-09-30 ENCOUNTER — Other Ambulatory Visit: Payer: Self-pay | Admitting: Family Medicine

## 2020-09-30 DIAGNOSIS — M810 Age-related osteoporosis without current pathological fracture: Secondary | ICD-10-CM

## 2020-09-30 DIAGNOSIS — Z1231 Encounter for screening mammogram for malignant neoplasm of breast: Secondary | ICD-10-CM

## 2020-10-21 DIAGNOSIS — E782 Mixed hyperlipidemia: Secondary | ICD-10-CM | POA: Diagnosis not present

## 2020-10-21 DIAGNOSIS — G4700 Insomnia, unspecified: Secondary | ICD-10-CM | POA: Diagnosis not present

## 2020-10-21 DIAGNOSIS — N183 Chronic kidney disease, stage 3 unspecified: Secondary | ICD-10-CM | POA: Diagnosis not present

## 2020-10-21 DIAGNOSIS — J4520 Mild intermittent asthma, uncomplicated: Secondary | ICD-10-CM | POA: Diagnosis not present

## 2020-10-21 DIAGNOSIS — F324 Major depressive disorder, single episode, in partial remission: Secondary | ICD-10-CM | POA: Diagnosis not present

## 2020-10-21 DIAGNOSIS — D509 Iron deficiency anemia, unspecified: Secondary | ICD-10-CM | POA: Diagnosis not present

## 2020-10-21 DIAGNOSIS — K219 Gastro-esophageal reflux disease without esophagitis: Secondary | ICD-10-CM | POA: Diagnosis not present

## 2020-10-21 DIAGNOSIS — M810 Age-related osteoporosis without current pathological fracture: Secondary | ICD-10-CM | POA: Diagnosis not present

## 2020-11-08 ENCOUNTER — Other Ambulatory Visit: Payer: Self-pay

## 2020-11-08 ENCOUNTER — Encounter (INDEPENDENT_AMBULATORY_CARE_PROVIDER_SITE_OTHER): Payer: Medicare Other | Admitting: Ophthalmology

## 2020-11-08 DIAGNOSIS — H338 Other retinal detachments: Secondary | ICD-10-CM | POA: Diagnosis not present

## 2020-11-08 DIAGNOSIS — H43813 Vitreous degeneration, bilateral: Secondary | ICD-10-CM

## 2020-11-08 DIAGNOSIS — H4423 Degenerative myopia, bilateral: Secondary | ICD-10-CM

## 2020-12-07 DIAGNOSIS — M7752 Other enthesopathy of left foot: Secondary | ICD-10-CM | POA: Diagnosis not present

## 2020-12-07 DIAGNOSIS — M792 Neuralgia and neuritis, unspecified: Secondary | ICD-10-CM | POA: Diagnosis not present

## 2020-12-07 DIAGNOSIS — M7751 Other enthesopathy of right foot: Secondary | ICD-10-CM | POA: Diagnosis not present

## 2020-12-07 DIAGNOSIS — M19072 Primary osteoarthritis, left ankle and foot: Secondary | ICD-10-CM | POA: Diagnosis not present

## 2020-12-07 DIAGNOSIS — M19071 Primary osteoarthritis, right ankle and foot: Secondary | ICD-10-CM | POA: Diagnosis not present

## 2021-01-04 DIAGNOSIS — N1831 Chronic kidney disease, stage 3a: Secondary | ICD-10-CM | POA: Diagnosis not present

## 2021-01-04 DIAGNOSIS — K5900 Constipation, unspecified: Secondary | ICD-10-CM | POA: Diagnosis not present

## 2021-01-04 DIAGNOSIS — J4520 Mild intermittent asthma, uncomplicated: Secondary | ICD-10-CM | POA: Diagnosis not present

## 2021-01-04 DIAGNOSIS — Z0000 Encounter for general adult medical examination without abnormal findings: Secondary | ICD-10-CM | POA: Diagnosis not present

## 2021-01-04 DIAGNOSIS — F331 Major depressive disorder, recurrent, moderate: Secondary | ICD-10-CM | POA: Diagnosis not present

## 2021-01-04 DIAGNOSIS — E782 Mixed hyperlipidemia: Secondary | ICD-10-CM | POA: Diagnosis not present

## 2021-01-04 DIAGNOSIS — M810 Age-related osteoporosis without current pathological fracture: Secondary | ICD-10-CM | POA: Diagnosis not present

## 2021-01-04 DIAGNOSIS — K219 Gastro-esophageal reflux disease without esophagitis: Secondary | ICD-10-CM | POA: Diagnosis not present

## 2021-01-04 DIAGNOSIS — G4700 Insomnia, unspecified: Secondary | ICD-10-CM | POA: Diagnosis not present

## 2021-01-04 DIAGNOSIS — B356 Tinea cruris: Secondary | ICD-10-CM | POA: Diagnosis not present

## 2021-01-21 ENCOUNTER — Ambulatory Visit
Admission: RE | Admit: 2021-01-21 | Discharge: 2021-01-21 | Disposition: A | Discharge status: Home / Self Care | Payer: Medicare Other | Source: Ambulatory Visit | Attending: Family Medicine | Admitting: Family Medicine

## 2021-01-21 ENCOUNTER — Other Ambulatory Visit: Payer: Self-pay

## 2021-01-21 DIAGNOSIS — M8589 Other specified disorders of bone density and structure, multiple sites: Secondary | ICD-10-CM | POA: Diagnosis not present

## 2021-01-21 DIAGNOSIS — M810 Age-related osteoporosis without current pathological fracture: Secondary | ICD-10-CM

## 2021-01-21 DIAGNOSIS — Z1231 Encounter for screening mammogram for malignant neoplasm of breast: Secondary | ICD-10-CM

## 2021-01-21 DIAGNOSIS — Z780 Asymptomatic menopausal state: Secondary | ICD-10-CM | POA: Diagnosis not present

## 2021-02-07 DIAGNOSIS — N1831 Chronic kidney disease, stage 3a: Secondary | ICD-10-CM | POA: Diagnosis not present

## 2021-02-13 DIAGNOSIS — H66001 Acute suppurative otitis media without spontaneous rupture of ear drum, right ear: Secondary | ICD-10-CM | POA: Diagnosis not present

## 2021-02-13 DIAGNOSIS — R059 Cough, unspecified: Secondary | ICD-10-CM | POA: Diagnosis not present

## 2021-02-13 DIAGNOSIS — Z20822 Contact with and (suspected) exposure to covid-19: Secondary | ICD-10-CM | POA: Diagnosis not present

## 2021-02-13 DIAGNOSIS — R509 Fever, unspecified: Secondary | ICD-10-CM | POA: Diagnosis not present

## 2021-02-13 DIAGNOSIS — Z03818 Encounter for observation for suspected exposure to other biological agents ruled out: Secondary | ICD-10-CM | POA: Diagnosis not present

## 2021-02-13 DIAGNOSIS — J209 Acute bronchitis, unspecified: Secondary | ICD-10-CM | POA: Diagnosis not present

## 2021-03-01 DIAGNOSIS — R509 Fever, unspecified: Secondary | ICD-10-CM | POA: Diagnosis not present

## 2021-03-01 DIAGNOSIS — J209 Acute bronchitis, unspecified: Secondary | ICD-10-CM | POA: Diagnosis not present

## 2021-04-01 DIAGNOSIS — N183 Chronic kidney disease, stage 3 unspecified: Secondary | ICD-10-CM | POA: Diagnosis not present

## 2021-04-01 DIAGNOSIS — F331 Major depressive disorder, recurrent, moderate: Secondary | ICD-10-CM | POA: Diagnosis not present

## 2021-04-01 DIAGNOSIS — K219 Gastro-esophageal reflux disease without esophagitis: Secondary | ICD-10-CM | POA: Diagnosis not present

## 2021-04-01 DIAGNOSIS — E782 Mixed hyperlipidemia: Secondary | ICD-10-CM | POA: Diagnosis not present

## 2021-04-01 DIAGNOSIS — G4700 Insomnia, unspecified: Secondary | ICD-10-CM | POA: Diagnosis not present

## 2021-04-01 DIAGNOSIS — D509 Iron deficiency anemia, unspecified: Secondary | ICD-10-CM | POA: Diagnosis not present

## 2021-04-01 DIAGNOSIS — J4540 Moderate persistent asthma, uncomplicated: Secondary | ICD-10-CM | POA: Diagnosis not present

## 2021-04-01 DIAGNOSIS — M8580 Other specified disorders of bone density and structure, unspecified site: Secondary | ICD-10-CM | POA: Diagnosis not present

## 2021-04-01 DIAGNOSIS — M810 Age-related osteoporosis without current pathological fracture: Secondary | ICD-10-CM | POA: Diagnosis not present

## 2021-04-14 DIAGNOSIS — M5417 Radiculopathy, lumbosacral region: Secondary | ICD-10-CM | POA: Diagnosis not present

## 2021-04-14 DIAGNOSIS — R634 Abnormal weight loss: Secondary | ICD-10-CM | POA: Diagnosis not present

## 2021-04-14 DIAGNOSIS — G609 Hereditary and idiopathic neuropathy, unspecified: Secondary | ICD-10-CM | POA: Diagnosis not present

## 2021-04-14 DIAGNOSIS — M5412 Radiculopathy, cervical region: Secondary | ICD-10-CM | POA: Diagnosis not present

## 2021-04-14 DIAGNOSIS — R202 Paresthesia of skin: Secondary | ICD-10-CM | POA: Diagnosis not present

## 2021-04-14 DIAGNOSIS — G603 Idiopathic progressive neuropathy: Secondary | ICD-10-CM | POA: Diagnosis not present

## 2021-04-14 DIAGNOSIS — R2689 Other abnormalities of gait and mobility: Secondary | ICD-10-CM | POA: Diagnosis not present

## 2021-04-14 DIAGNOSIS — R296 Repeated falls: Secondary | ICD-10-CM | POA: Diagnosis not present

## 2021-04-14 DIAGNOSIS — G5603 Carpal tunnel syndrome, bilateral upper limbs: Secondary | ICD-10-CM | POA: Diagnosis not present

## 2021-04-28 DIAGNOSIS — G603 Idiopathic progressive neuropathy: Secondary | ICD-10-CM | POA: Diagnosis not present

## 2021-04-28 DIAGNOSIS — M5412 Radiculopathy, cervical region: Secondary | ICD-10-CM | POA: Diagnosis not present

## 2021-04-28 DIAGNOSIS — M545 Low back pain, unspecified: Secondary | ICD-10-CM | POA: Diagnosis not present

## 2021-04-28 DIAGNOSIS — R2689 Other abnormalities of gait and mobility: Secondary | ICD-10-CM | POA: Diagnosis not present

## 2021-05-09 DIAGNOSIS — H26492 Other secondary cataract, left eye: Secondary | ICD-10-CM | POA: Diagnosis not present

## 2021-06-09 DIAGNOSIS — R2689 Other abnormalities of gait and mobility: Secondary | ICD-10-CM | POA: Diagnosis not present

## 2021-06-09 DIAGNOSIS — G5603 Carpal tunnel syndrome, bilateral upper limbs: Secondary | ICD-10-CM | POA: Diagnosis not present

## 2021-06-09 DIAGNOSIS — G603 Idiopathic progressive neuropathy: Secondary | ICD-10-CM | POA: Diagnosis not present

## 2021-06-09 DIAGNOSIS — M545 Low back pain, unspecified: Secondary | ICD-10-CM | POA: Diagnosis not present

## 2021-06-16 DIAGNOSIS — J4521 Mild intermittent asthma with (acute) exacerbation: Secondary | ICD-10-CM | POA: Diagnosis not present

## 2021-06-16 DIAGNOSIS — J0100 Acute maxillary sinusitis, unspecified: Secondary | ICD-10-CM | POA: Diagnosis not present

## 2021-06-17 DIAGNOSIS — M8580 Other specified disorders of bone density and structure, unspecified site: Secondary | ICD-10-CM | POA: Diagnosis not present

## 2021-06-17 DIAGNOSIS — N1831 Chronic kidney disease, stage 3a: Secondary | ICD-10-CM | POA: Diagnosis not present

## 2021-06-17 DIAGNOSIS — M810 Age-related osteoporosis without current pathological fracture: Secondary | ICD-10-CM | POA: Diagnosis not present

## 2021-06-17 DIAGNOSIS — J4521 Mild intermittent asthma with (acute) exacerbation: Secondary | ICD-10-CM | POA: Diagnosis not present

## 2021-06-17 DIAGNOSIS — J4520 Mild intermittent asthma, uncomplicated: Secondary | ICD-10-CM | POA: Diagnosis not present

## 2021-06-17 DIAGNOSIS — F331 Major depressive disorder, recurrent, moderate: Secondary | ICD-10-CM | POA: Diagnosis not present

## 2021-06-17 DIAGNOSIS — E782 Mixed hyperlipidemia: Secondary | ICD-10-CM | POA: Diagnosis not present

## 2021-06-17 DIAGNOSIS — J4530 Mild persistent asthma, uncomplicated: Secondary | ICD-10-CM | POA: Diagnosis not present

## 2021-06-17 DIAGNOSIS — D509 Iron deficiency anemia, unspecified: Secondary | ICD-10-CM | POA: Diagnosis not present

## 2021-06-17 DIAGNOSIS — G4700 Insomnia, unspecified: Secondary | ICD-10-CM | POA: Diagnosis not present

## 2021-06-17 DIAGNOSIS — J4540 Moderate persistent asthma, uncomplicated: Secondary | ICD-10-CM | POA: Diagnosis not present

## 2021-06-26 ENCOUNTER — Encounter (HOSPITAL_BASED_OUTPATIENT_CLINIC_OR_DEPARTMENT_OTHER): Payer: Self-pay | Admitting: Urology

## 2021-06-26 ENCOUNTER — Inpatient Hospital Stay (HOSPITAL_BASED_OUTPATIENT_CLINIC_OR_DEPARTMENT_OTHER)
Admission: EM | Admit: 2021-06-26 | Discharge: 2021-07-04 | DRG: 871 | Disposition: A | Discharge status: Home Health | Payer: Medicare Other | Attending: Internal Medicine | Admitting: Internal Medicine

## 2021-06-26 ENCOUNTER — Other Ambulatory Visit: Payer: Self-pay

## 2021-06-26 ENCOUNTER — Emergency Department (HOSPITAL_BASED_OUTPATIENT_CLINIC_OR_DEPARTMENT_OTHER): Payer: Medicare Other

## 2021-06-26 DIAGNOSIS — A419 Sepsis, unspecified organism: Secondary | ICD-10-CM | POA: Diagnosis present

## 2021-06-26 DIAGNOSIS — J189 Pneumonia, unspecified organism: Secondary | ICD-10-CM

## 2021-06-26 DIAGNOSIS — K219 Gastro-esophageal reflux disease without esophagitis: Secondary | ICD-10-CM | POA: Diagnosis present

## 2021-06-26 DIAGNOSIS — J1282 Pneumonia due to coronavirus disease 2019: Secondary | ICD-10-CM | POA: Diagnosis present

## 2021-06-26 DIAGNOSIS — N1831 Chronic kidney disease, stage 3a: Secondary | ICD-10-CM | POA: Diagnosis present

## 2021-06-26 DIAGNOSIS — R000 Tachycardia, unspecified: Secondary | ICD-10-CM | POA: Diagnosis not present

## 2021-06-26 DIAGNOSIS — D509 Iron deficiency anemia, unspecified: Secondary | ICD-10-CM | POA: Diagnosis present

## 2021-06-26 DIAGNOSIS — U071 COVID-19: Secondary | ICD-10-CM | POA: Diagnosis not present

## 2021-06-26 DIAGNOSIS — J4521 Mild intermittent asthma with (acute) exacerbation: Secondary | ICD-10-CM | POA: Diagnosis present

## 2021-06-26 DIAGNOSIS — J9601 Acute respiratory failure with hypoxia: Secondary | ICD-10-CM | POA: Diagnosis present

## 2021-06-26 DIAGNOSIS — R059 Cough, unspecified: Secondary | ICD-10-CM | POA: Diagnosis not present

## 2021-06-26 DIAGNOSIS — Z96659 Presence of unspecified artificial knee joint: Secondary | ICD-10-CM | POA: Diagnosis present

## 2021-06-26 DIAGNOSIS — E782 Mixed hyperlipidemia: Secondary | ICD-10-CM | POA: Diagnosis present

## 2021-06-26 DIAGNOSIS — A4189 Other specified sepsis: Principal | ICD-10-CM | POA: Diagnosis present

## 2021-06-26 DIAGNOSIS — T380X5A Adverse effect of glucocorticoids and synthetic analogues, initial encounter: Secondary | ICD-10-CM | POA: Diagnosis present

## 2021-06-26 DIAGNOSIS — R0602 Shortness of breath: Secondary | ICD-10-CM | POA: Diagnosis not present

## 2021-06-26 DIAGNOSIS — R0902 Hypoxemia: Secondary | ICD-10-CM

## 2021-06-26 LAB — CBC WITH DIFFERENTIAL/PLATELET
Abs Immature Granulocytes: 0.35 10*3/uL — ABNORMAL HIGH (ref 0.00–0.07)
Basophils Absolute: 0.1 10*3/uL (ref 0.0–0.1)
Basophils Relative: 0 %
Eosinophils Absolute: 0.6 10*3/uL — ABNORMAL HIGH (ref 0.0–0.5)
Eosinophils Relative: 3 %
HCT: 39.5 % (ref 36.0–46.0)
Hemoglobin: 13.2 g/dL (ref 12.0–15.0)
Immature Granulocytes: 2 %
Lymphocytes Relative: 9 %
Lymphs Abs: 1.9 10*3/uL (ref 0.7–4.0)
MCH: 30.8 pg (ref 26.0–34.0)
MCHC: 33.4 g/dL (ref 30.0–36.0)
MCV: 92.1 fL (ref 80.0–100.0)
Monocytes Absolute: 2.7 10*3/uL — ABNORMAL HIGH (ref 0.1–1.0)
Monocytes Relative: 13 %
Neutro Abs: 15.2 10*3/uL — ABNORMAL HIGH (ref 1.7–7.7)
Neutrophils Relative %: 73 %
Platelets: 282 10*3/uL (ref 150–400)
RBC: 4.29 MIL/uL (ref 3.87–5.11)
RDW: 13 % (ref 11.5–15.5)
WBC: 20.9 10*3/uL — ABNORMAL HIGH (ref 4.0–10.5)
nRBC: 0 % (ref 0.0–0.2)

## 2021-06-26 MED ORDER — ACETAMINOPHEN 500 MG PO TABS
1000.0000 mg | ORAL_TABLET | Freq: Once | ORAL | Status: AC
  Administered 2021-06-26: 1000 mg via ORAL
  Filled 2021-06-26: qty 2

## 2021-06-26 NOTE — ED Notes (Signed)
Finished antibiotic yesterday for bronchitis and prednisone

## 2021-06-26 NOTE — ED Triage Notes (Signed)
Pt state cough x 1 week with worsening SOB since last night.  Pt febrile.  Spo2 84% on RA, respiratory at bedside.  Pt sleepy but states took [place] at 2200.

## 2021-06-27 ENCOUNTER — Encounter (HOSPITAL_COMMUNITY): Payer: Self-pay | Admitting: Internal Medicine

## 2021-06-27 DIAGNOSIS — R7989 Other specified abnormal findings of blood chemistry: Secondary | ICD-10-CM | POA: Diagnosis not present

## 2021-06-27 DIAGNOSIS — J189 Pneumonia, unspecified organism: Secondary | ICD-10-CM | POA: Diagnosis not present

## 2021-06-27 DIAGNOSIS — K219 Gastro-esophageal reflux disease without esophagitis: Secondary | ICD-10-CM | POA: Diagnosis present

## 2021-06-27 DIAGNOSIS — A4189 Other specified sepsis: Secondary | ICD-10-CM | POA: Diagnosis present

## 2021-06-27 DIAGNOSIS — U071 COVID-19: Secondary | ICD-10-CM

## 2021-06-27 DIAGNOSIS — T380X5A Adverse effect of glucocorticoids and synthetic analogues, initial encounter: Secondary | ICD-10-CM | POA: Diagnosis present

## 2021-06-27 DIAGNOSIS — J9601 Acute respiratory failure with hypoxia: Secondary | ICD-10-CM

## 2021-06-27 DIAGNOSIS — D509 Iron deficiency anemia, unspecified: Secondary | ICD-10-CM | POA: Diagnosis present

## 2021-06-27 DIAGNOSIS — A419 Sepsis, unspecified organism: Secondary | ICD-10-CM

## 2021-06-27 DIAGNOSIS — J4521 Mild intermittent asthma with (acute) exacerbation: Secondary | ICD-10-CM | POA: Diagnosis present

## 2021-06-27 DIAGNOSIS — E782 Mixed hyperlipidemia: Secondary | ICD-10-CM

## 2021-06-27 DIAGNOSIS — N1831 Chronic kidney disease, stage 3a: Secondary | ICD-10-CM | POA: Diagnosis present

## 2021-06-27 DIAGNOSIS — R0602 Shortness of breath: Secondary | ICD-10-CM | POA: Diagnosis not present

## 2021-06-27 DIAGNOSIS — Z96659 Presence of unspecified artificial knee joint: Secondary | ICD-10-CM | POA: Diagnosis present

## 2021-06-27 DIAGNOSIS — J1282 Pneumonia due to coronavirus disease 2019: Secondary | ICD-10-CM | POA: Diagnosis present

## 2021-06-27 DIAGNOSIS — R000 Tachycardia, unspecified: Secondary | ICD-10-CM | POA: Diagnosis not present

## 2021-06-27 DIAGNOSIS — J4520 Mild intermittent asthma, uncomplicated: Secondary | ICD-10-CM

## 2021-06-27 HISTORY — DX: Chronic kidney disease, stage 3a: N18.31

## 2021-06-27 HISTORY — DX: Gastro-esophageal reflux disease without esophagitis: K21.9

## 2021-06-27 HISTORY — DX: Mixed hyperlipidemia: E78.2

## 2021-06-27 HISTORY — DX: Mild intermittent asthma, uncomplicated: J45.20

## 2021-06-27 LAB — COMPREHENSIVE METABOLIC PANEL
ALT: 28 U/L (ref 0–44)
AST: 26 U/L (ref 15–41)
Albumin: 3 g/dL — ABNORMAL LOW (ref 3.5–5.0)
Alkaline Phosphatase: 57 U/L (ref 38–126)
Anion gap: 9 (ref 5–15)
BUN: 13 mg/dL (ref 8–23)
CO2: 21 mmol/L — ABNORMAL LOW (ref 22–32)
Calcium: 9.3 mg/dL (ref 8.9–10.3)
Chloride: 104 mmol/L (ref 98–111)
Creatinine, Ser: 0.83 mg/dL (ref 0.44–1.00)
GFR, Estimated: >60 mL/min (ref >60)
Glucose, Bld: 205 mg/dL — ABNORMAL HIGH (ref 70–99)
Potassium: 3.9 mmol/L (ref 3.5–5.1)
Sodium: 134 mmol/L — ABNORMAL LOW (ref 135–145)
Total Bilirubin: 0.6 mg/dL (ref 0.3–1.2)
Total Protein: 6.3 g/dL — ABNORMAL LOW (ref 6.5–8.1)

## 2021-06-27 LAB — CBC WITH DIFFERENTIAL/PLATELET
Abs Immature Granulocytes: 0.3 10*3/uL — ABNORMAL HIGH (ref 0.00–0.07)
Basophils Absolute: 0.1 10*3/uL (ref 0.0–0.1)
Basophils Relative: 0 %
Eosinophils Absolute: 0 10*3/uL (ref 0.0–0.5)
Eosinophils Relative: 0 %
HCT: 37.3 % (ref 36.0–46.0)
Hemoglobin: 12.4 g/dL (ref 12.0–15.0)
Immature Granulocytes: 1 %
Lymphocytes Relative: 7 %
Lymphs Abs: 1.9 10*3/uL (ref 0.7–4.0)
MCH: 30.9 pg (ref 26.0–34.0)
MCHC: 33.2 g/dL (ref 30.0–36.0)
MCV: 93 fL (ref 80.0–100.0)
Monocytes Absolute: 2.4 10*3/uL — ABNORMAL HIGH (ref 0.1–1.0)
Monocytes Relative: 9 %
Neutro Abs: 20.9 10*3/uL — ABNORMAL HIGH (ref 1.7–7.7)
Neutrophils Relative %: 83 %
Platelets: 294 10*3/uL (ref 150–400)
RBC: 4.01 MIL/uL (ref 3.87–5.11)
RDW: 12.7 % (ref 11.5–15.5)
WBC: 25.7 10*3/uL — ABNORMAL HIGH (ref 4.0–10.5)
nRBC: 0 % (ref 0.0–0.2)

## 2021-06-27 LAB — BASIC METABOLIC PANEL
Anion gap: 11 (ref 5–15)
BUN: 14 mg/dL (ref 8–23)
CO2: 24 mmol/L (ref 22–32)
Calcium: 9.3 mg/dL (ref 8.9–10.3)
Chloride: 101 mmol/L (ref 98–111)
Creatinine, Ser: 0.83 mg/dL (ref 0.44–1.00)
GFR, Estimated: >60 mL/min (ref >60)
Glucose, Bld: 142 mg/dL — ABNORMAL HIGH (ref 70–99)
Potassium: 3.9 mmol/L (ref 3.5–5.1)
Sodium: 136 mmol/L (ref 135–145)

## 2021-06-27 LAB — LACTIC ACID, PLASMA
Lactic Acid, Venous: 1.1 mmol/L (ref 0.5–1.9)
Lactic Acid, Venous: 1.4 mmol/L (ref 0.5–1.9)

## 2021-06-27 LAB — RESP PANEL BY RT-PCR (FLU A&B, COVID) ARPGX2
Influenza A by PCR: NEGATIVE
Influenza B by PCR: NEGATIVE
SARS Coronavirus 2 by RT PCR: POSITIVE — AB

## 2021-06-27 LAB — MAGNESIUM: Magnesium: 2 mg/dL (ref 1.7–2.4)

## 2021-06-27 LAB — D-DIMER, QUANTITATIVE: D-Dimer, Quant: 0.6 ug/mL-FEU — ABNORMAL HIGH (ref 0.00–0.50)

## 2021-06-27 MED ORDER — PANTOPRAZOLE SODIUM 40 MG PO TBEC
40.0000 mg | DELAYED_RELEASE_TABLET | Freq: Every morning | ORAL | Status: DC
  Administered 2021-06-28 – 2021-07-04 (×7): 40 mg via ORAL
  Filled 2021-06-27 (×5): qty 1

## 2021-06-27 MED ORDER — GUAIFENESIN-CODEINE 100-10 MG/5ML PO SOLN
10.0000 mL | Freq: Four times a day (QID) | ORAL | Status: DC | PRN
  Administered 2021-06-28 – 2021-07-03 (×7): 10 mL via ORAL
  Filled 2021-06-27 (×8): qty 10

## 2021-06-27 MED ORDER — FAMOTIDINE 20 MG PO TABS
40.0000 mg | ORAL_TABLET | Freq: Every day | ORAL | Status: DC
  Administered 2021-06-28 – 2021-07-03 (×7): 40 mg via ORAL
  Filled 2021-06-27 (×7): qty 2

## 2021-06-27 MED ORDER — SODIUM CHLORIDE 0.9 % IV SOLN
500.0000 mg | Freq: Every day | INTRAVENOUS | Status: DC
  Administered 2021-06-28: 500 mg via INTRAVENOUS
  Filled 2021-06-27: qty 500

## 2021-06-27 MED ORDER — NIRMATRELVIR/RITONAVIR (PAXLOVID)TABLET
3.0000 | ORAL_TABLET | Freq: Two times a day (BID) | ORAL | Status: DC
  Filled 2021-06-27 (×2): qty 30

## 2021-06-27 MED ORDER — ATORVASTATIN CALCIUM 10 MG PO TABS
5.0000 mg | ORAL_TABLET | ORAL | Status: DC
  Administered 2021-06-29 – 2021-07-03 (×3): 5 mg via ORAL
  Filled 2021-06-27 (×5): qty 1

## 2021-06-27 MED ORDER — ATORVASTATIN CALCIUM 10 MG PO TABS
5.0000 mg | ORAL_TABLET | Freq: Once | ORAL | Status: AC
  Administered 2021-06-28: 5 mg via ORAL
  Filled 2021-06-27: qty 1

## 2021-06-27 MED ORDER — ALBUTEROL SULFATE HFA 108 (90 BASE) MCG/ACT IN AERS
2.0000 | INHALATION_SPRAY | RESPIRATORY_TRACT | Status: DC | PRN
  Filled 2021-06-27: qty 6.7

## 2021-06-27 MED ORDER — NIRMATRELVIR/RITONAVIR (PAXLOVID)TABLET: 3.0000 | ORAL_TABLET | Freq: Two times a day (BID) | ORAL | Status: DC

## 2021-06-27 MED ORDER — ENOXAPARIN SODIUM 40 MG/0.4ML IJ SOSY
40.0000 mg | PREFILLED_SYRINGE | Freq: Every day | INTRAMUSCULAR | Status: DC
Start: 2021-06-28 — End: 2021-07-03
  Administered 2021-06-28 – 2021-07-02 (×5): 40 mg via SUBCUTANEOUS
  Filled 2021-06-27 (×5): qty 0.4

## 2021-06-27 MED ORDER — RALOXIFENE HCL 60 MG PO TABS
60.0000 mg | ORAL_TABLET | Freq: Every day | ORAL | Status: DC
  Administered 2021-06-28 – 2021-07-04 (×7): 60 mg via ORAL
  Filled 2021-06-27 (×7): qty 1

## 2021-06-27 MED ORDER — ACETAMINOPHEN 325 MG PO TABS: 650.0000 mg | ORAL_TABLET | Freq: Four times a day (QID) | ORAL | Status: DC | PRN

## 2021-06-27 MED ORDER — ONDANSETRON HCL 4 MG PO TABS: 4.0000 mg | ORAL_TABLET | Freq: Four times a day (QID) | ORAL | Status: DC | PRN

## 2021-06-27 MED ORDER — METHYLPREDNISOLONE SODIUM SUCC 125 MG IJ SOLR
80.0000 mg | Freq: Every day | INTRAMUSCULAR | Status: AC
  Administered 2021-06-28 – 2021-06-29 (×3): 80 mg via INTRAVENOUS
  Filled 2021-06-27 (×3): qty 2

## 2021-06-27 MED ORDER — POLYETHYLENE GLYCOL 3350 17 G PO PACK: 17.0000 g | PACK | Freq: Every day | ORAL | Status: DC | PRN

## 2021-06-27 MED ORDER — DEXAMETHASONE SODIUM PHOSPHATE 10 MG/ML IJ SOLN
10.0000 mg | Freq: Once | INTRAMUSCULAR | Status: AC
  Administered 2021-06-27: 10 mg via INTRAVENOUS
  Filled 2021-06-27: qty 1

## 2021-06-27 MED ORDER — BUPROPION HCL ER (XL) 150 MG PO TB24
300.0000 mg | ORAL_TABLET | Freq: Every day | ORAL | Status: DC
  Administered 2021-06-28 – 2021-07-04 (×7): 300 mg via ORAL
  Filled 2021-06-27 (×7): qty 2

## 2021-06-27 MED ORDER — ALBUTEROL SULFATE HFA 108 (90 BASE) MCG/ACT IN AERS: 2.0000 | INHALATION_SPRAY | Freq: Four times a day (QID) | RESPIRATORY_TRACT | Status: DC

## 2021-06-27 MED ORDER — SODIUM CHLORIDE 0.9 % IV SOLN
2.0000 g | INTRAVENOUS | Status: DC
  Administered 2021-06-28: 2 g via INTRAVENOUS
  Filled 2021-06-27: qty 20

## 2021-06-27 MED ORDER — ACETAMINOPHEN 650 MG RE SUPP: 650.0000 mg | Freq: Four times a day (QID) | RECTAL | Status: DC | PRN

## 2021-06-27 MED ORDER — ASCORBIC ACID 500 MG PO TABS
500.0000 mg | ORAL_TABLET | Freq: Every day | ORAL | Status: DC
  Administered 2021-06-28 – 2021-07-04 (×7): 500 mg via ORAL
  Filled 2021-06-27 (×7): qty 1

## 2021-06-27 MED ORDER — ZINC SULFATE 220 (50 ZN) MG PO CAPS
220.0000 mg | ORAL_CAPSULE | Freq: Every day | ORAL | Status: DC
  Administered 2021-06-29 – 2021-07-04 (×6): 220 mg via ORAL
  Filled 2021-06-27 (×7): qty 1

## 2021-06-27 MED ORDER — ALBUTEROL SULFATE HFA 108 (90 BASE) MCG/ACT IN AERS
2.0000 | INHALATION_SPRAY | Freq: Four times a day (QID) | RESPIRATORY_TRACT | Status: DC
  Administered 2021-06-28 – 2021-06-29 (×5): 2 via RESPIRATORY_TRACT
  Filled 2021-06-27: qty 6.7

## 2021-06-27 MED ORDER — DULOXETINE HCL 60 MG PO CPEP
60.0000 mg | ORAL_CAPSULE | Freq: Every day | ORAL | Status: DC
  Administered 2021-06-28 – 2021-07-03 (×7): 60 mg via ORAL
  Filled 2021-06-27 (×7): qty 1

## 2021-06-27 MED ORDER — ONDANSETRON HCL 4 MG/2ML IJ SOLN: 4.0000 mg | Freq: Four times a day (QID) | INTRAMUSCULAR | Status: DC | PRN

## 2021-06-27 MED ORDER — SODIUM CHLORIDE 0.9 % IV SOLN
500.0000 mg | Freq: Once | INTRAVENOUS | Status: AC
  Administered 2021-06-27: 500 mg via INTRAVENOUS
  Filled 2021-06-27: qty 500

## 2021-06-27 MED ORDER — PREDNISONE 5 MG PO TABS
50.0000 mg | ORAL_TABLET | Freq: Every day | ORAL | Status: DC
  Administered 2021-06-30: 50 mg via ORAL
  Filled 2021-06-27: qty 2

## 2021-06-27 MED ORDER — SODIUM CHLORIDE 0.9 % IV SOLN
1.0000 g | Freq: Once | INTRAVENOUS | Status: AC
  Administered 2021-06-27: 1 g via INTRAVENOUS
  Filled 2021-06-27: qty 10

## 2021-06-27 NOTE — ED Provider Notes (Signed)
[facility] EMERGENCY DEPT Provider Note   CSN: [id number] Arrival date & time: 06/26/21  2313     History Chief Complaint  Patient presents with   Shortness of Breath   Cough    Tracy Young is a 75 y.o. female.  HPI     This is a 75 year old female with a history of chronic kidney disease, hyperlipidemia, intermittent asthma who presents with shortness of breath and fever.  Patient took an Ambien prior to my evaluation and the husband provides most of the history.  Reports that over the last several weeks they have had several discrete illnesses.  Husband tested positive for COVID 2 weeks ago but she tested negative.  Subsequently she began to have a cough and was treated with prednisone and amoxicillin for bronchitis.  She finished the antibiotics yesterday.  Husband reports she has had progressive shortness of breath and coughing spasms over the last 2 nights.  Has also had chills.  They have not documented temperature at home.  Husband states he has completely recovered.  Patient reports some shortness of breath and "pain all over."  Denies chest pain, abdominal pain, nausea, vomiting.  History reviewed. No pertinent past medical history.  Patient Active Problem List   Diagnosis Date Noted   CAP (community acquired pneumonia) 06/27/2021    Past Surgical History:  Procedure Laterality Date   TOTAL KNEE ARTHROPLASTY       OB History   No obstetric history on file.     History reviewed. No pertinent family history.  Social History   Tobacco Use   Smoking status: Never    Passive exposure: Never   Smokeless tobacco: Never  Substance Use Topics   Alcohol use: Never   Drug use: Never    Home Medications Prior to Admission medications   Medication Sig Start Date End Date Taking? Authorizing Provider  atorvastatin (LIPITOR) 10 MG tablet Take 5 mg by mouth at bedtime. 04/02/21   [provider]  buPROPion (WELLBUTRIN XL) 300 MG 24 hr tablet  Take 300 mg by mouth daily. 04/02/21   [provider]  DULoxetine (CYMBALTA) 30 MG capsule Take 30 mg by mouth daily. 06/22/21   [provider]  pantoprazole (PROTONIX) 40 MG tablet Take 40 mg by mouth daily. 04/02/21   [provider]  raloxifene (EVISTA) 60 MG tablet Take 60 mg by mouth daily. 04/02/21   [provider]  zolpidem (AMBIEN) 10 MG tablet Take 10 mg by mouth at bedtime as needed. 02/10/21   [provider]    Allergies    Patient has no allergy information on record.  Review of Systems   Review of Systems  Constitutional:  Positive for chills and fever.  Respiratory:  Positive for cough and shortness of breath.   Cardiovascular:  Negative for chest pain and leg swelling.  Gastrointestinal:  Negative for nausea and vomiting.  Neurological:  Negative for headaches.  All other systems reviewed and are negative.  Physical Exam Updated Vital Signs BP 120/65   Pulse (!) 102   Temp (!) 100.9 F (38.3 C) (Oral)   Resp (!) 24   Ht 1.626 m (5\' 4" )   Wt 75.8 kg   SpO2 93%   BMI 28.67 kg/m   Physical Exam Vitals and nursing note reviewed.  Constitutional:      Appearance: She is well-developed. She is ill-appearing. She is not toxic-appearing.  HENT:     Head: Normocephalic and atraumatic.  Mouth/Throat:     Mouth: Mucous membranes are moist.  Eyes:     Pupils: Pupils are equal, round, and reactive to light.  Cardiovascular:     Rate and Rhythm: Normal rate and regular rhythm.     Heart sounds: Normal heart sounds.  Pulmonary:     Effort: Tachypnea and accessory muscle usage present. No respiratory distress.     Breath sounds: Examination of the left-lower field reveals rales. Rales present. No wheezing.     Comments: Nasal cannula in place Abdominal:     General: Bowel sounds are normal.     Palpations: Abdomen is soft.  Musculoskeletal:     Cervical back: Neck supple.     Right lower leg: No edema.  Skin:     General: Skin is warm and dry.  Neurological:     Mental Status: She is alert and oriented to person, place, and time.  Psychiatric:        Mood and Affect: Mood normal.    ED Results / Procedures / Treatments   Labs (all labs ordered are listed, but only abnormal results are displayed) Labs Reviewed  RESP PANEL BY RT-PCR (FLU A&B, COVID) ARPGX2 - Abnormal; Notable for the following components:      Result Value   SARS Coronavirus 2 by RT PCR POSITIVE (*)    All other components within normal limits  CBC WITH DIFFERENTIAL/PLATELET - Abnormal; Notable for the following components:   WBC 20.9 (*)    Neutro Abs 15.2 (*)    Monocytes Absolute 2.7 (*)    Eosinophils Absolute 0.6 (*)    Abs Immature Granulocytes 0.35 (*)    All other components within normal limits  BASIC METABOLIC PANEL - Abnormal; Notable for the following components:   Glucose, Bld 142 (*)    All other components within normal limits  CULTURE, BLOOD (ROUTINE X 2)  CULTURE, BLOOD (ROUTINE X 2)  LACTIC ACID, PLASMA  LACTIC ACID, PLASMA    EKG EKG Interpretation  Date/Time:  Sunday June 26 2021 23:32:53 EDT Ventricular Rate:  131 PR Interval:  131 QRS Duration: 86 QT Interval:  314 QTC Calculation: 464 R Axis:   74 Text Interpretation: Sinus tachycardia Confirmed by Thayer Jew [phone]) on 06/27/2021 12:28:23 AM  Radiology DG Chest Portable 1 View  Result Date: 06/27/2021 CLINICAL DATA:  Cough EXAM: PORTABLE CHEST 1 VIEW COMPARISON:  None. FINDINGS: There is increased opacity at the left lung base. Cardiomediastinal contours are normal. No pneumothorax or sizable pleural effusion. IMPRESSION: Increased opacity at the left lung base may indicate developing infection. Electronically Signed   By: Ulyses Jarred M.D.   On: 06/27/2021 00:00    Procedures .Critical Care Performed by: Merryl Hacker, MD Authorized by: Merryl Hacker, MD   Critical care provider statement:    Critical care time  (minutes):  60   Critical care was time spent personally by me on the following activities:  Discussions with consultants, evaluation of patient's response to treatment, examination of patient, ordering and performing treatments and interventions, ordering and review of laboratory studies, ordering and review of radiographic studies, pulse oximetry, re-evaluation of patient's condition, obtaining history from patient or surrogate and review of old charts   Medications Ordered in ED Medications  nirmatrelvir/ritonavir EUA (PAXLOVID) 3 tablet (3 tablets Oral Not Given 06/27/21 0332)  acetaminophen (TYLENOL) tablet 1,000 mg (1,000 mg Oral Given 06/26/21 2347)  cefTRIAXone (ROCEPHIN) 1 g in sodium chloride 0.9 % 100 mL IVPB (0  g Intravenous Stopped 06/27/21 0138)  azithromycin (ZITHROMAX) 500 mg in sodium chloride 0.9 % 250 mL IVPB (0 mg Intravenous Stopped 06/27/21 0215)  dexamethasone (DECADRON) injection 10 mg (10 mg Intravenous Given 06/27/21 0212)    ED Course  I have reviewed the triage vital signs and the nursing notes.  Pertinent labs & imaging results that were available during my care of the patient were reviewed by me and considered in my medical decision making (see chart for details).  Clinical Course as of 06/27/21 0441  Mon Jun 27, 2021  0132 Spoke with hospitalist.  He agrees with treating for bacterial component given the way the chest x-ray looks.  We also discussed how to approach her COVID-19.  It is unclear her time course.  He recommended starting Paxlovid. [CH]    Clinical Course User Index [CH] Fiorella Hanahan, Barbette Hair, MD   MDM Rules/Calculators/A&P                           Patient presents with fever, cough, hypoxia.  Has had recent respiratory illness over the last several weeks.  Finished a course of prednisone and antibiotics yesterday.  She is ill-appearing but nontoxic.  Vital signs notable for temperature of 100.9.  Tachycardia of 145.  Respirations 25 to 40/min with  O2 sats in the mid 80s.  Clinical picture is concerning given recent appropriate treatment for bronchitis and atypical pneumonia.  Labs obtained.  Chest x-ray independently reviewed by myself.  She appears to have a lobular left lower lobe pneumonia.  She was covered with Rocephin and azithromycin to cover for more atypical.  She otherwise does not have any risk factors for hospital-acquired pneumonia.  She has a leukocytosis to 20 but this is also in the setting of recent prednisone use.  COVID testing is also positive.  Lactate normal.  Does not need aggressive fluid resuscitation.  No other significant metabolic derangements.  EKG shows no signs of acute ischemia or arrhythmia.  On recheck, patient appears more comfortable.  She is on stable nasal cannula.  Heart rate improved with temperature control.  See clinical course above.  Blood cultures pending and patient was given Paxovid.  Final Clinical Impression(s) / ED Diagnoses Final diagnoses:  Hypoxia  COVID-19  Community acquired pneumonia of left lower lobe of lung    Rx / DC Orders ED Discharge Orders     None        Christohper Dube, Barbette Hair, MD 06/27/21 [phone]

## 2021-06-27 NOTE — Assessment & Plan Note (Addendum)
   Asthma exacerbation may be complicating patient's presentation  Scheduled bronchodilator therapy  On systemic steroids

## 2021-06-27 NOTE — Assessment & Plan Note (Addendum)
   Patient reports symptoms starting approximately 2 weeks ago and undergoing a home COVID test at that time which was negative, likely a false negative.  Patient is currently COVID PCR positive.   Contact and report isolation  Systemic steroids initiated due to substantial hypoxia  Barcitinib initiated due to substantial oxygen requirement  Patient currently requiring high flow oxygen delivery  Procalcitonin normal suggesting against bacterial coinfection.  CTA chest reveals no evidence of concurrent pulmonary embolism.  As needed bronchodilator therapy for shortness of breath and wheezing  Daily zinc and vitamin C supplementation  As needed antitussives

## 2021-06-27 NOTE — Assessment & Plan Note (Signed)
.   Continuing home regimen of lipid lowering therapy.  

## 2021-06-27 NOTE — Assessment & Plan Note (Signed)
   suspected left lower lobe bacterial pneumonia based on review of chest imaging and substantial leukocytosis  Providing patient with submental oxygen to maintain oxygen saturations of 92-96%  Treating suspected  bacterial pneumonia for now with intravenous ceftriaxone and azithromycin  As needed bronchodilator therapy for shortness of breath and wheezing  Additionally obtaining stat D-dimer and if that is markedly elevated we will send patient for CT pulm angiogram of the chest  Based on CT level of the positive COVID-19 testing being 43.3 this is suggestive that the COVID-19 is very unlikely to be clinically active

## 2021-06-27 NOTE — ED Notes (Signed)
Called [facility] for assistance to get bed type changed for patient.  Spoke with Maudie Mercury and she will notify Oncologist at [facility].

## 2021-06-27 NOTE — Assessment & Plan Note (Signed)
Continuing home regimen of daily PPI therapy.  

## 2021-06-27 NOTE — Assessment & Plan Note (Deleted)
   Patient exhibiting acute hypoxic respiratory failure in the setting of recent COVID-19 infection and possible superimposed left lower lobe bacterial pneumonia  Providing patient with submental oxygen to maintain oxygen saturations of 92-96%  Treating suspected  bacterial pneumonia for now with intravenous ceftriaxone and azithromycin  As needed bronchodilator therapy for shortness of breath and wheezing  Additionally obtaining stat D-dimer and if that is markedly elevated we will send patient for CT pulm angiogram of the chest  Based on CT level of the positive COVID-19 testing being 43.3 this is suggestive that the COVID-19 is very unlikely to be clinically active

## 2021-06-27 NOTE — ED Notes (Signed)
Dr. Dina Rich aware of positive covid result

## 2021-06-27 NOTE — ED Notes (Signed)
Pt given a frozen diner. Placed on 5L [place] to eat, will place back on [facility]

## 2021-06-27 NOTE — Assessment & Plan Note (Addendum)
Patient presenting with multiple SIRS criteria including tachycardia leukocytosis and tachypnea in the setting of suspected severe COVID-19 infection and evidence of organ dysfunction with substantial acute hypoxic respiratory failure.  This is all suggestive of sepsis secondary to severe COVID-19 related illness  Treating COVID with intravenous remdesivir, systemic steroids and Barcitinib Hydrating patient with intravenous isotonic fluids  Blood cultures obtained Close clinical monitoring as patient is at high risk of rapid clinical decompensation

## 2021-06-27 NOTE — Assessment & Plan Note (Addendum)
   Patient exhibiting acute hypoxic respiratory failure in the setting of prolonged COVID-19 infection   Procalcitonin unremarkable suggesting against bacterial coinfection but will trend.  CTA chest reveals no evidence of pulmonary embolism and reveals bilateral patchy infiltrates consistent with COVID infection.    Providing patient with submental oxygen to maintain oxygen saturations of 92-96%  Treating patient for suspected severe COVID-19 infection with steroids, barcitinib -- refusing remdesivir  Scheduled bronchodilator therapy for shortness of breath and wheezing

## 2021-06-27 NOTE — Assessment & Plan Note (Addendum)
   History of chronic kidney disease stage IIIa based on review of outpatient records however creatinine is not suggestive of that here Strict intake and output monitoring Minimizing nephrotoxic agents as much as possible Daily labs

## 2021-06-27 NOTE — ED Notes (Signed)
PP called, pt needs to be admitted to a progressive bed. Secretary paging MD

## 2021-06-27 NOTE — H&P (Addendum)
History and Physical    Tracy Young OBS:[id number] DOB: 10/29/1945 DOA: 06/26/2021  PCP: Maury Dus, MD  Patient coming from: Home via [place] ED   Chief Complaint:  Chief Complaint  Patient presents with   Shortness of Breath   Cough     HPI:   History of mild intermittent asthma, hyperlipidemia chronic kidney disease stage IIIa, iron deficiency anemia, gastroesophageal reflux disease who presents to [facility] as a transfer from [facility] ED due to complaints of cough and shortness of breath.  Approximately 2 weeks ago the patient began to experience a dry nonproductive cough.  Around that same time, patient's husband was also having similar symptoms and was found to be positive for COVID.  She had also taken a COVID test at that time and was found to be negative. Despite this negative test patient continued to experience progressively worsening cough generalized weakness and shortness of breath in the days that followed.  Patient also began to experience associated poor appetite and "hurting all over."  Patient had followed up as an outpatient with her primary care provider and was placed on a course of prednisone and amoxicillin for suspected bronchitis.  Patient states that she has been taking this medication for the past two days with no improvement.    As the patients' symptoms persisted she also began to develop associated chills and subjective fevers.  Due to progressively worsening symptoms the patient initially presented to [place] emergency department for evaluation.   Upon evaluation in the emergency department patient was initially found to have an oxygen saturation of 84% on room air requiring initiation of supplemental oxygen via nasal cannula.  Oxygen requirements continue to increase throughout the emergency department stay.  COVID-19 PCR testing was performed and was found to be positive.  Additionally, patient was  found to have a leukocytosis of 20.9 but this was felt to at least in part be secondary to steroids.  Revealing an opacity in the left lung base concerning for pneumonia therefore patient was placed on intravenous ceftriaxone and azithromycin.  Patient was additionally provided Paxil but and Decadron for concurrent COVID-19 infection.  The hospitalist group was then called and the patient has since been accepted for transfer to [facility] for continued medical care.  Review of Systems:   Review of Systems  Constitutional:  Positive for chills and malaise/fatigue.  Respiratory:  Positive for cough and shortness of breath.   Cardiovascular:  Positive for chest pain.  Neurological:  Positive for weakness.  All other systems reviewed and are negative.  Past Medical History:  Diagnosis Date   Chronic kidney disease, stage 3a ([place]) 06/27/2021   GERD without esophagitis 06/27/2021   Mild intermittent asthma without complication 47/65/4650   Mixed hyperlipidemia 06/27/2021    Past Surgical History:  Procedure Laterality Date   TOTAL KNEE ARTHROPLASTY       reports that she has never smoked. She has never been exposed to tobacco smoke. She has never used smokeless tobacco. She reports that she does not drink alcohol and does not use drugs.  Allergies  Allergen Reactions   Doxycycline Anxiety and Nausea Only    Family History  Problem Relation Age of Onset   Heart disease Neg Hx      Prior to Admission medications   Medication Sig Start Date End Date Taking? Authorizing Provider  albuterol (VENTOLIN HFA) 108 (90 Base) MCG/ACT inhaler Inhale 2 puffs into the lungs every  4 (four) hours as needed for wheezing or shortness of breath. 06/11/21  Yes [provider]  Ascorbic Acid (VITAMIN C) 1000 MG tablet Take 1,000 mg by mouth every morning.   Yes [provider]  atorvastatin (LIPITOR) 10 MG tablet Take 5 mg by mouth See admin instructions. Take one tablet (10  mg) by mouth every other night 04/02/21  Yes [provider]  buPROPion (WELLBUTRIN XL) 300 MG 24 hr tablet Take 300 mg by mouth daily. 04/02/21  Yes [provider]  Cholecalciferol (VITAMIN D-3) 125 MCG (5000 UT) TABS Take 5,000 Units by mouth every morning.   Yes [provider]  Cyanocobalamin (VITAMIN B-12 PO) Take 1 tablet by mouth every morning.   Yes [provider]  DULoxetine (CYMBALTA) 60 MG capsule Take 60 mg by mouth at bedtime.   Yes [provider]  famotidine (PEPCID) 40 MG tablet Take 40 mg by mouth at bedtime.   Yes [provider]  Multiple Minerals-Vitamins (CALCIUM CITRATE-MAG-MINERALS) TABS Take 1 tablet by mouth every morning. Calcium Citrate plus magnesium and zinc   Yes [provider]  [facility] powder Apply 1 application topically daily. Apply to legs after showering 06/19/21  Yes [provider]  pantoprazole (PROTONIX) 40 MG tablet Take 40 mg by mouth every morning. 04/02/21  Yes [provider]  raloxifene (EVISTA) 60 MG tablet Take 60 mg by mouth daily. 04/02/21  Yes [provider]  zolpidem (AMBIEN) 10 MG tablet Take 10 mg by mouth at bedtime. 02/10/21  Yes [provider]  amoxicillin-clavulanate (AUGMENTIN) 875-125 MG tablet Take 1 tablet by mouth 2 (two) times daily. Patient not taking: No sig reported 06/16/21   [provider]  DULoxetine (CYMBALTA) 30 MG capsule Take 30 mg by mouth daily. Patient not taking: No sig reported 06/22/21   [provider]  predniSONE (DELTASONE) 20 MG tablet Take 40 mg by mouth every morning. Patient not taking: No sig reported 06/16/21   [provider]    Physical Exam: Vitals:   06/28/21 0050 06/28/21 0423 06/28/21 0813 06/28/21 0857  BP: (!) 143/75 138/90 138/65   Pulse: 98 99 99 96  Resp: 20 20 19 16   Temp: 98 F (36.7 C) 99.7 F (37.6 C) 98 F (36.7 C)   TempSrc: Oral Oral Oral   SpO2: 95% 96% 96% 97%   Weight:      Height:       Constitutional: Awake alert and oriented x3, Patient is in respiratory distress Skin: no rashes, no lesions, Poor skin turgor noted. Eyes: Pupils are equally reactive to light.  No evidence of scleral icterus or conjunctival pallor.  ENMT: Dry mucous membranes noted.  Posterior pharynx clear of any exudate or lesions.   Neck: normal, supple, no masses, no thyromegaly.  No evidence of jugular venous distension.   Respiratory: Bibasilar and mid field rales with expiratory wheezing.  clear to auscultation bilaterally, no wheezing, no crackles. Normal respiratory effort. No accessory muscle use.  Cardiovascular: Tachycardic rate with regular rhythm, no murmurs / rubs / gallops. No extremity edema. 2+ pedal pulses. No carotid bruits.  Chest:   Nontender without crepitus or deformity.   Back:   Nontender without crepitus or deformity. Abdomen: Abdomen is soft and nontender.  No evidence of intra-abdominal masses.  Positive bowel sounds noted in all quadrants.   Musculoskeletal: No joint deformity upper and lower extremities. Good ROM, no contractures. Normal muscle tone.  Neurologic: CN 2-12 grossly intact. Sensation  intact.  Patient moving all 4 extremities spontaneously.  Patient is following all commands.  Patient is responsive to verbal stimuli.   Psychiatric: Patient exhibits anxiousmood with appropriate affect.  Patient seems to possess insight as to their current situation.     Labs on Admission: I have personally reviewed following labs and imaging studies -   CBC: Recent Labs  Lab 06/26/21 2342 06/27/21 2308  WBC 20.9* 25.7*  NEUTROABS 15.2* 20.9*  HGB 13.2 12.4  HCT 39.5 37.3  MCV 92.1 93.0  PLT 282 465   Basic Metabolic Panel: Recent Labs  Lab 06/26/21 2342 06/27/21 2308  NA 136 134*  K 3.9 3.9  CL 101 104  CO2 24 21*  GLUCOSE 142* 205*  BUN 14 13  CREATININE 0.83 0.83  CALCIUM 9.3 9.3  MG  --  2.0   GFR: Estimated Creatinine  Clearance: 60.4 mL/min (by C-G formula based on SCr of 0.83 mg/dL). Liver Function Tests: Recent Labs  Lab 06/27/21 2308  AST 26  ALT 28  ALKPHOS 57  BILITOT 0.6  PROT 6.3*  ALBUMIN 3.0*   No results for input(s): LIPASE, AMYLASE in the last 168 hours. No results for input(s): AMMONIA in the last 168 hours. Coagulation Profile: No results for input(s): INR, PROTIME in the last 168 hours. Cardiac Enzymes: No results for input(s): CKTOTAL, CKMB, CKMBINDEX, TROPONINI in the last 168 hours. BNP (last 3 results) No results for input(s): PROBNP in the last 8760 hours. HbA1C: No results for input(s): HGBA1C in the last 72 hours. CBG: No results for input(s): GLUCAP in the last 168 hours. Lipid Profile: No results for input(s): CHOL, HDL, LDLCALC, TRIG, CHOLHDL, LDLDIRECT in the last 72 hours. Thyroid Function Tests: No results for input(s): TSH, T4TOTAL, FREET4, T3FREE, THYROIDAB in the last 72 hours. Anemia Panel: No results for input(s): VITAMINB12, FOLATE, FERRITIN, TIBC, IRON, RETICCTPCT in the last 72 hours. Urine analysis:    Component Value Date/Time   COLORURINE YELLOW 06/09/2011 1107   APPEARANCEUR CLEAR 06/09/2011 1107   LABSPEC 1.010 06/09/2011 1107   PHURINE 6.5 06/09/2011 1107   GLUCOSEU NEGATIVE 06/09/2011 1107   HGBUR NEGATIVE 06/09/2011 1107   BILIRUBINUR NEGATIVE 06/09/2011 1107   KETONESUR NEGATIVE 06/09/2011 1107   PROTEINUR NEGATIVE 06/09/2011 1107   UROBILINOGEN 0.2 06/09/2011 1107   NITRITE NEGATIVE 06/09/2011 1107   LEUKOCYTESUR TRACE (A) 06/09/2011 1107    Radiological Exams on Admission - Personally Reviewed: CT Angio Chest Pulmonary Embolism (PE) W or WO Contrast  Result Date: 06/28/2021 CLINICAL DATA:  Positive D-dimer.  Shortness of breath and EXAM: CT ANGIOGRAPHY CHEST WITH CONTRAST TECHNIQUE: Multidetector CT imaging of the chest was performed using the standard protocol during bolus administration of intravenous contrast. Multiplanar CT image  reconstructions and MIPs were obtained to evaluate the vascular anatomy. CONTRAST:  127mL OMNIPAQUE IOHEXOL 350 MG/ML SOLN COMPARISON:  None. FINDINGS: Cardiovascular: Suboptimal opacification of the pulmonary arteries due to bolus timing, limiting evaluation beyond the lobar branches. Within that limitation, there is no central pulmonary embolus. The size of the main pulmonary artery is normal. Heart size is normal, with no pericardial effusion. The course and caliber of the aorta are normal. There is atherosclerotic calcification. No acute aortic syndrome. Mediastinum/Nodes: No mediastinal, hilar or axillary lymphadenopathy. Normal visualized thyroid. Thoracic esophageal course is normal. Lungs/Pleura: Bilateral confluent ground-glass opacities, likely pulmonary edema. No pleural effusion. Upper Abdomen: Contrast bolus timing is not optimized for evaluation of the abdominal organs. The visualized portions of the organs of  the upper abdomen are normal. Musculoskeletal: No chest wall abnormality. No bony spinal canal stenosis. Review of the MIP images confirms the above findings. IMPRESSION: 1. Suboptimal opacification of the pulmonary arteries due to bolus timing, limiting evaluation beyond the lobar branches. Within that limitation, there is no central pulmonary embolus. 2. Bilateral confluent ground-glass opacities, likely pulmonary edema. Aortic atherosclerosis (ICD10-I70.0). Electronically Signed   By: Ulyses Jarred M.D.   On: 06/28/2021 02:32   DG Chest Portable 1 View  Result Date: 06/27/2021 CLINICAL DATA:  Cough EXAM: PORTABLE CHEST 1 VIEW COMPARISON:  None. FINDINGS: There is increased opacity at the left lung base. Cardiomediastinal contours are normal. No pneumothorax or sizable pleural effusion. IMPRESSION: Increased opacity at the left lung base may indicate developing infection. Electronically Signed   By: Ulyses Jarred M.D.   On: 06/27/2021 00:00    EKG: Personally reviewed.  Rhythm is sinus  tachycardia with heart rate of 131.  No dynamic ST segment changes appreciated.  Assessment/Plan  * Acute respiratory failure with hypoxia (HCC) Patient exhibiting acute hypoxic respiratory failure in the setting of prolonged COVID-19 infection  Procalcitonin unremarkable suggesting against bacterial coinfection.  CTA chest reveals no evidence of pulmonary embolism and reveals bilateral patchy infiltrates consistent with COVID infection.   Providing patient with submental oxygen to maintain oxygen saturations of 92-96% Treating patient for suspected severe COVID-19 infection with remdesivir, steroids, barcitinib  Scheduled bronchodilator therapy for shortness of breath and wheezing   Sepsis due to COVID-19 [facility]) Patient presenting with multiple SIRS criteria including tachycardia leukocytosis and tachypnea in the setting of suspected severe COVID-19 infection and evidence of organ dysfunction with substantial acute hypoxic respiratory failure.  This is all suggestive of sepsis secondary to severe COVID-19 related illness  Treating COVID with intravenous remdesivir, systemic steroids and Barcitinib Hydrating patient with intravenous isotonic fluids  Blood cultures obtained Close clinical monitoring as patient is at high risk of rapid clinical decompensation   Mild intermittent asthma with (acute) exacerbation Asthma exacerbation may be complicating patient's presentation Scheduled bronchodilator therapy On systemic steroids  COVID-19 virus infection Patient reports symptoms starting approximately 2 weeks ago and undergoing a home COVID test at that time which was negative I believe that that was likely a false negative.  Patient is currently COVID PCR positive.  Contact and report isolation Systemic steroids initiated due to substantial hypoxia Intravenous remdesivir initiated Barcitinib initiated due to substantial oxygen requirement Patient currently requiring high flow oxygen  delivery Procalcitonin normal suggesting against bacterial coinfection.  CTA chest reveals no evidence of concurrent pulmonary embolism. As needed bronchodilator therapy for shortness of breath and wheezing Daily zinc and vitamin C supplementation As needed antitussives  Chronic kidney disease, stage 3a (HCC) History of chronic kidney disease stage IIIa based on review of outpatient records however creatinine is not suggestive of that here Strict intake and output monitoring Minimizing nephrotoxic agents as much as possible Serial chemistries to monitor renal function and electrolytes   Mixed hyperlipidemia Continuing home regimen of lipid lowering therapy.   GERD without esophagitis Continuing home regimen of daily PPI therapy.       Code Status:  Full code  code status decision has been confirmed with: deferred Family Communication: Deferred   Status is: Inpatient  Remains inpatient appropriate because:Ongoing diagnostic testing needed not appropriate for outpatient work up, IV treatments appropriate due to intensity of illness or inability to take PO, and Inpatient level of care appropriate due to severity of illness  Dispo: The  patient is from: Home              Anticipated d/c is to: Home              Patient currently is not medically stable to d/c.   Difficult to place patient No        Vernelle Emerald MD [facility] Pager [phone]  If 7PM-7AM, please contact night-coverage [email or web address] Use universal [place] password for that web site. If you do not have the password, please call the hospital operator.  06/28/2021, 9:30 AM

## 2021-06-27 NOTE — Progress Notes (Signed)
Patient had a severe decrease in oxygen saturation to 82% on 3lpm [place]. Upon assessing her I learned that she had just gotten up to use the bathroom and couldn't maintain her oxygen saturation. She was coughing and had increased SOB and WOB. Rt placed her on a 100% NRB  mask. She is tolerating well at this time with no distress. Her saturation increased to 95%. RT will continue to monitor.

## 2021-06-27 NOTE — ED Notes (Addendum)
Called carelink at 113am to page hospitalist  Responded at 128am

## 2021-06-28 ENCOUNTER — Inpatient Hospital Stay (HOSPITAL_COMMUNITY): Payer: Medicare Other

## 2021-06-28 DIAGNOSIS — J9601 Acute respiratory failure with hypoxia: Secondary | ICD-10-CM | POA: Diagnosis not present

## 2021-06-28 LAB — PROCALCITONIN: Procalcitonin: <0.1 ng/mL

## 2021-06-28 LAB — C-REACTIVE PROTEIN: CRP: 8.5 mg/dL — ABNORMAL HIGH (ref <1.0)

## 2021-06-28 LAB — BRAIN NATRIURETIC PEPTIDE: B Natriuretic Peptide: 26.8 pg/mL (ref 0.0–100.0)

## 2021-06-28 LAB — TROPONIN I (HIGH SENSITIVITY): Troponin I (High Sensitivity): 4 ng/L (ref <18)

## 2021-06-28 MED ORDER — SODIUM CHLORIDE 0.9 % IV SOLN: 100.0000 mg | Freq: Every day | INTRAVENOUS | Status: DC

## 2021-06-28 MED ORDER — IOHEXOL 350 MG/ML SOLN
100.0000 mL | Freq: Once | INTRAVENOUS | Status: AC | PRN
  Administered 2021-06-28: 100 mL via INTRAVENOUS

## 2021-06-28 MED ORDER — SODIUM CHLORIDE 0.9 % IV BOLUS
500.0000 mL | Freq: Once | INTRAVENOUS | Status: AC
  Administered 2021-06-28: 500 mL via INTRAVENOUS

## 2021-06-28 MED ORDER — BARICITINIB 2 MG PO TABS
4.0000 mg | ORAL_TABLET | Freq: Every day | ORAL | Status: DC
  Administered 2021-06-28 – 2021-07-04 (×7): 4 mg via ORAL
  Filled 2021-06-28 (×7): qty 2

## 2021-06-28 MED ORDER — SODIUM CHLORIDE 0.9 % IV SOLN
200.0000 mg | Freq: Once | INTRAVENOUS | Status: DC
  Filled 2021-06-28: qty 40

## 2021-06-28 MED ORDER — SODIUM CHLORIDE 0.9 % IV SOLN: INTRAVENOUS | Status: DC

## 2021-06-28 NOTE — TOC Initial Note (Signed)
Transition of Care [facility]) - Initial/Assessment Note    Patient Details  Name: Tracy Young MRN: [id number] Date of Birth: [birth year]  Transition of Care [facility]) CM/SW Contact:    Verdell Carmine, RN Phone Number: 06/28/2021, 8:26 AM  Clinical Narrative:                  75 year old patient admitted with COVID pneumonia.  WBC elevated, felt to be due in part to steriods.  On oxygen.via [place] currently.  PT order placed for evaluation of needs.  CM will follow for needs, recommendations, and transitions.  Expected Discharge Plan: [place] Barriers to Discharge: Continued Medical Work up   Patient Goals and CMS Choice        Expected Discharge Plan and Services Expected Discharge Plan: [place]   Discharge Planning Services: CM Consult   Living arrangements for the past 2 months: Single Family Home                                      Prior Living Arrangements/Services Living arrangements for the past 2 months: Single Family Home Lives with:: Spouse Patient language and need for interpreter reviewed:: Yes        Need for Family Participation in Patient Care: Yes (Comment) Care giver support system in place?: Yes (comment)   Criminal Activity/Legal Involvement Pertinent to Current Situation/Hospitalization: No - Comment as needed  Activities of Daily Living      Permission Sought/Granted                  Emotional Assessment       Orientation: : Oriented to Self, Oriented to Place, Oriented to  Time, Oriented to Situation Alcohol / Substance Use: Not Applicable Psych Involvement: No (comment)  Admission diagnosis:  Hypoxia [R09.02] CAP (community acquired pneumonia) [J18.9] Community acquired pneumonia of left lower lobe of lung [J18.9] COVID-19 [U07.1] Patient Active Problem List   Diagnosis Date Noted   Pneumonia of left lower lobe due to infectious organism 06/27/2021   COVID-19 virus infection  06/27/2021   Acute respiratory failure with hypoxia ([place]) 06/27/2021   Chronic kidney disease, stage 3a ([place]) 06/27/2021   GERD without esophagitis 06/27/2021   Mixed hyperlipidemia 06/27/2021   Mild intermittent asthma with (acute) exacerbation 06/27/2021   Sepsis due to pneumonia ([place]) 06/27/2021   PCP:  Maury Dus, MD Pharmacy:   [id number] - [place], [place] - [address] [place] [address] Phone: [phone] Fax: [phone]     Social Determinants of Health (SDOH) Interventions    Readmission Risk Interventions No flowsheet data found.

## 2021-06-28 NOTE — Progress Notes (Signed)
  Progress Note    Tracy Young   LNL:[id number]  DOB: 07-25-1946  DOA: 06/26/2021     1 Date of Service: 06/28/2021     Subjective:  Hesitant to use remdesivir as a friend had a bad outcome after getting  Hospital Problems * Acute respiratory failure with hypoxia ([place]) Patient exhibiting acute hypoxic respiratory failure in the setting of prolonged COVID-19 infection  Procalcitonin unremarkable suggesting against bacterial coinfection but will trend.  CTA chest reveals no evidence of pulmonary embolism and reveals bilateral patchy infiltrates consistent with COVID infection.   Providing patient with submental oxygen to maintain oxygen saturations of 92-96% Treating patient for suspected severe COVID-19 infection with steroids, barcitinib -- refusing remdesivir Scheduled bronchodilator therapy for shortness of breath and wheezing   Sepsis due to COVID-19 [facility]) Patient presenting with multiple SIRS criteria including tachycardia leukocytosis and tachypnea in the setting of suspected severe COVID-19 infection and evidence of organ dysfunction with substantial acute hypoxic respiratory failure.  This is all suggestive of sepsis secondary to severe COVID-19 related illness  Treating COVID with intravenous remdesivir, systemic steroids and Barcitinib Hydrating patient with intravenous isotonic fluids  Blood cultures obtained Close clinical monitoring as patient is at high risk of rapid clinical decompensation   Mild intermittent asthma with (acute) exacerbation Asthma exacerbation may be complicating patient's presentation Scheduled bronchodilator therapy On systemic steroids  Mixed hyperlipidemia Continuing home regimen of lipid lowering therapy.   GERD without esophagitis Continuing home regimen of daily PPI therapy.   Chronic kidney disease, stage 3a ([place]) History of chronic kidney disease stage IIIa based on review of outpatient records however creatinine is not suggestive  of that here Strict intake and output monitoring Minimizing nephrotoxic agents as much as possible Daily labs   COVID-19 virus infection Patient reports symptoms starting approximately 2 weeks ago and undergoing a home COVID test at that time which was negative, likely a false negative.  Patient is currently COVID PCR positive.  Contact and report isolation Systemic steroids initiated due to substantial hypoxia Barcitinib initiated due to substantial oxygen requirement Patient currently requiring high flow oxygen delivery Procalcitonin normal suggesting against bacterial coinfection.  CTA chest reveals no evidence of concurrent pulmonary embolism. As needed bronchodilator therapy for shortness of breath and wheezing Daily zinc and vitamin C supplementation As needed antitussives    Objective Vital signs were reviewed and unremarkable.  Vitals:   06/28/21 0857 06/28/21 1300 06/28/21 1308 06/28/21 1314  BP:    (!) 113/93  Pulse: 96   99  Resp: 16   (!) 23  Temp:    98.4 F (36.9 C)  TempSrc:    Oral  SpO2: 97% 99% 96% 95%  Weight:      Height:       74.6 kg  Exam  General: Appearance:     Overweight female in mild respiratory distress     Lungs:     Rales at right lung base, on HFNC  Heart:    Normal heart rate.     MS:   All extremities are intact.    Neurologic:   Awake, alert, oriented x 3. No apparent focal neurological           defect.     Labs / Other Information WBCs elevated but lower than prior day    [place] [facility] 06/28/2021, 2:25 PM

## 2021-06-28 NOTE — Evaluation (Signed)
Physical Therapy Evaluation Patient Details Name: Tracy Young MRN: [id number] DOB: [birth year] Today's Date: 06/28/2021  History of Present Illness  75yo female who presented on 10/9 with progressive SOB, cough, and weakness. Found to be hypoxic to 84% on room air and tested positive for Covid. Transferred from [place] ED to [facility] for ongoing care. PMH CKD, HDL, TKR, asthma  Clinical Impression   Patient received in bed, very pleasant and cooperative. Able to get to EOB without physical assist, however once sitting up dropped to 80%/HR increase to 114BPM on 11LPM HFNC. Had a very difficult time recovering so had to increase O2 to 13LPM to reach 90% and above even after return to bed. Quite symptomatic with O2 desat, and has very poor functional activity tolerance- hopefully this will improve as she continues to recover from Covid. Left in bed positioned to comfort with VSS on 11LPM at EOS. Will benefit from skilled HHPT f/u at DC, also recommend DME as below to promote energy conservation and functional independence.      Recommendations for follow up therapy are one component of a multi-disciplinary discharge planning process, led by the attending physician.  Recommendations may be updated based on patient status, additional functional criteria and insurance authorization.  Follow Up Recommendations Home health PT    Equipment Recommendations  Other (comment) (shower seat and rollator)    Recommendations for Other Services       Precautions / Restrictions Precautions Precautions: Other (comment) Precaution Comments: Covid +, watch sats and HR Restrictions Weight Bearing Restrictions: No      Mobility  Bed Mobility Overal bed mobility: Modified Independent             General bed mobility comments: extra time due to multiple lines, HOB elevated    Transfers                 General transfer comment: deferred- SpO2 drop to 80% just sitting at EOB, very dyspneic  and SOB  Ambulation/Gait             General Gait Details: deferred- SpO2 drop to 80% just sitting EOB, very dyspneic and SOB  Stairs            Wheelchair Mobility    Modified Rankin (Stroke Patients Only)       Balance Overall balance assessment: Mild deficits observed, not formally tested                                           Pertinent Vitals/Pain Pain Assessment: 0-10 Pain Score: 4  Pain Location: chest when coughing/congestion Pain Descriptors / Indicators: Aching;Discomfort;Tightness Pain Intervention(s): Limited activity within patient's tolerance;Monitored during session    Home Living Family/patient expects to be discharged to:: Private residence Living Arrangements: Spouse/significant other Available Help at Discharge: Family;Available 24 hours/day Type of Home: House Home Access: Stairs to enter   [employer] of Steps: front porch- 4-5 steps with B rails but cannot reach at the same time; garage enterance has 5 steps with L ascending rail Home Layout: One level Home Equipment: Walker - 2 wheels;Bedside commode Additional Comments: still driving; manages own meds    Prior Function Level of Independence: Independent               Hand Dominance        Extremity/Trunk Assessment   Upper Extremity Assessment Upper Extremity Assessment:  Defer to OT evaluation    Lower Extremity Assessment Lower Extremity Assessment: Generalized weakness    Cervical / Trunk Assessment Cervical / Trunk Assessment: Normal  Communication   Communication: No difficulties  Cognition Arousal/Alertness: Awake/alert Behavior During Therapy: WFL for tasks assessed/performed;Anxious Overall Cognitive Status: Within Functional Limits for tasks assessed                                 General Comments: level of anxiety appropriate for the situation      General Comments      Exercises     Assessment/Plan     PT Assessment Patient needs continued PT services  PT Problem List Decreased strength;Decreased activity tolerance;Cardiopulmonary status limiting activity;Decreased mobility;Decreased knowledge of use of DME       PT Treatment Interventions DME instruction;Balance training;Gait training;Stair training;Functional mobility training;Patient/family education;Therapeutic activities;Therapeutic exercise    PT Goals (Current goals can be found in the Care Plan section)  Acute Rehab PT Goals Patient Stated Goal: be able to move without becoming SOB PT Goal Formulation: With patient/family Time For Goal Achievement: 07/12/21 Potential to Achieve Goals: Fair    Frequency Min 3X/week   Barriers to discharge        Co-evaluation               AM-PAC PT "6 Clicks" Mobility  Outcome Measure Help needed turning from your back to your side while in a flat bed without using bedrails?: None Help needed moving from lying on your back to sitting on the side of a flat bed without using bedrails?: None Help needed moving to and from a bed to a chair (including a wheelchair)?: A Little Help needed standing up from a chair using your arms (e.g., wheelchair or bedside chair)?: A Little Help needed to walk in hospital room?: A Little Help needed climbing 3-5 steps with a railing? : A Lot 6 Click Score: 19    End of Session Equipment Utilized During Treatment: Oxygen Activity Tolerance: Patient tolerated treatment well Patient left: in bed;with call bell/phone within reach;with family/visitor present Nurse Communication: Mobility status;Other (comment) (desat with low level mobility) PT Visit Diagnosis: Muscle weakness (generalized) (M62.81);Unsteadiness on feet (R26.81);Other abnormalities of gait and mobility (R26.89);Difficulty in walking, not elsewhere classified (R26.2)    Time: 2992-4268 PT Time Calculation (min) (ACUTE ONLY): 31 min   Charges:   PT Evaluation $PT Eval Moderate  Complexity: 1 Mod PT Treatments $Therapeutic Activity: 8-22 mins       Windell Norfolk, DPT, PN2   Supplemental Physical Therapist [place]    Pager [phone] Acute Rehab Office [phone]

## 2021-06-29 DIAGNOSIS — E782 Mixed hyperlipidemia: Secondary | ICD-10-CM | POA: Diagnosis not present

## 2021-06-29 DIAGNOSIS — J9601 Acute respiratory failure with hypoxia: Secondary | ICD-10-CM | POA: Diagnosis not present

## 2021-06-29 DIAGNOSIS — J4521 Mild intermittent asthma with (acute) exacerbation: Secondary | ICD-10-CM | POA: Diagnosis not present

## 2021-06-29 DIAGNOSIS — U071 COVID-19: Secondary | ICD-10-CM | POA: Diagnosis not present

## 2021-06-29 LAB — COMPREHENSIVE METABOLIC PANEL
ALT: 26 U/L (ref 0–44)
AST: 24 U/L (ref 15–41)
Albumin: 2.7 g/dL — ABNORMAL LOW (ref 3.5–5.0)
Alkaline Phosphatase: 64 U/L (ref 38–126)
Anion gap: 7 (ref 5–15)
BUN: 15 mg/dL (ref 8–23)
CO2: 22 mmol/L (ref 22–32)
Calcium: 8.8 mg/dL — ABNORMAL LOW (ref 8.9–10.3)
Chloride: 104 mmol/L (ref 98–111)
Creatinine, Ser: 0.86 mg/dL (ref 0.44–1.00)
GFR, Estimated: >60 mL/min (ref >60)
Glucose, Bld: 178 mg/dL — ABNORMAL HIGH (ref 70–99)
Potassium: 3.9 mmol/L (ref 3.5–5.1)
Sodium: 133 mmol/L — ABNORMAL LOW (ref 135–145)
Total Bilirubin: 0.8 mg/dL (ref 0.3–1.2)
Total Protein: 5.5 g/dL — ABNORMAL LOW (ref 6.5–8.1)

## 2021-06-29 LAB — CBC WITH DIFFERENTIAL/PLATELET
Abs Immature Granulocytes: 0.29 10*3/uL — ABNORMAL HIGH (ref 0.00–0.07)
Basophils Absolute: 0.1 10*3/uL (ref 0.0–0.1)
Basophils Relative: 0 %
Eosinophils Absolute: 0 10*3/uL (ref 0.0–0.5)
Eosinophils Relative: 0 %
HCT: 33.8 % — ABNORMAL LOW (ref 36.0–46.0)
Hemoglobin: 11.3 g/dL — ABNORMAL LOW (ref 12.0–15.0)
Immature Granulocytes: 1 %
Lymphocytes Relative: 6 %
Lymphs Abs: 1.5 10*3/uL (ref 0.7–4.0)
MCH: 31 pg (ref 26.0–34.0)
MCHC: 33.4 g/dL (ref 30.0–36.0)
MCV: 92.9 fL (ref 80.0–100.0)
Monocytes Absolute: 1.7 10*3/uL — ABNORMAL HIGH (ref 0.1–1.0)
Monocytes Relative: 6 %
Neutro Abs: 23.7 10*3/uL — ABNORMAL HIGH (ref 1.7–7.7)
Neutrophils Relative %: 87 %
Platelets: 290 10*3/uL (ref 150–400)
RBC: 3.64 MIL/uL — ABNORMAL LOW (ref 3.87–5.11)
RDW: 12.7 % (ref 11.5–15.5)
WBC: 27.3 10*3/uL — ABNORMAL HIGH (ref 4.0–10.5)
nRBC: 0 % (ref 0.0–0.2)

## 2021-06-29 LAB — PROCALCITONIN: Procalcitonin: <0.1 ng/mL

## 2021-06-29 LAB — D-DIMER, QUANTITATIVE: D-Dimer, Quant: 0.68 ug/mL-FEU — ABNORMAL HIGH (ref 0.00–0.50)

## 2021-06-29 LAB — C-REACTIVE PROTEIN: CRP: 2.6 mg/dL — ABNORMAL HIGH (ref <1.0)

## 2021-06-29 LAB — MAGNESIUM: Magnesium: 2 mg/dL (ref 1.7–2.4)

## 2021-06-29 MED ORDER — ALBUTEROL SULFATE HFA 108 (90 BASE) MCG/ACT IN AERS
2.0000 | INHALATION_SPRAY | Freq: Two times a day (BID) | RESPIRATORY_TRACT | Status: DC
  Administered 2021-06-29 – 2021-07-04 (×11): 2 via RESPIRATORY_TRACT
  Filled 2021-06-29: qty 6.7

## 2021-06-29 NOTE — Evaluation (Signed)
Occupational Therapy Evaluation Patient Details Name: Tracy Young MRN: [id number] DOB: 04-18-1946 Today's Date: 06/29/2021   History of Present Illness 75yo female who presented on 10/9 with progressive SOB, cough, and weakness. Found to be hypoxic to 84% on room air and tested positive for Covid. Transferred from [place] ED to [facility] for ongoing care. PMH CKD, HDL, TKR, asthma   Clinical Impression   Pt admitted for concerns listed above. PTA pt reported that she was independntw with all ADL's and IADL's, including traveling to visit her family often. At this time, pt is limited mostly by decreased activity tolerance and O2 needs. Pt continues to be able to complete all ADL's safely, she just requires increased time for rest breaks. OT will follow acutely to address activity tolerance deficits and energy conservation needs.       Recommendations for follow up therapy are one component of a multi-disciplinary discharge planning process, led by the attending physician.  Recommendations may be updated based on patient status, additional functional criteria and insurance authorization.   Follow Up Recommendations  No OT follow up    Equipment Recommendations  None recommended by OT    Recommendations for Other Services       Precautions / Restrictions Precautions Precautions: Fall Precaution Comments: Covid +, watch sats and HR Restrictions Weight Bearing Restrictions: No      Mobility Bed Mobility Overal bed mobility: Modified Independent             General bed mobility comments: extra time due to multiple lines, HOB elevated    Transfers Overall transfer level: Needs assistance Equipment used: 4-wheeled walker Transfers: Sit to/from Stand Sit to Stand: Min guard         General transfer comment: Min g for safety an dline management    Balance Overall balance assessment: Mild deficits observed, not formally tested                                          ADL either performed or assessed with clinical judgement   ADL Overall ADL's : Needs assistance/impaired Eating/Feeding: Independent;Sitting   Grooming: Supervision/safety;Standing   Upper Body Bathing: Supervision/ safety;Sitting   Lower Body Bathing: Min guard;Sitting/lateral leans;Sit to/from stand   Upper Body Dressing : Supervision/safety;Sitting   Lower Body Dressing: Min guard;Sitting/lateral leans;Sit to/from stand   Toilet Transfer: Min guard;Ambulation   Toileting- Clothing Manipulation and Hygiene: Min guard;Sitting/lateral lean;Sit to/from stand       Functional mobility during ADLs: Min guard;Rolling walker General ADL Comments: Pt overal safe and balanced with ADL's, she requires min guard for safety due to O2 saats dropping quickly and pt fatiguing quickly.     Vision Baseline Vision/History: 1 Wears glasses Ability to See in Adequate Light: 0 Adequate Patient Visual Report: No change from baseline Vision Assessment?: No apparent visual deficits     Perception Perception Perception Tested?: No   Praxis Praxis Praxis tested?: Not tested    Pertinent Vitals/Pain Pain Assessment: No/denies pain     Hand Dominance Right   Extremity/Trunk Assessment Upper Extremity Assessment Upper Extremity Assessment: Overall WFL for tasks assessed   Lower Extremity Assessment Lower Extremity Assessment: Defer to PT evaluation   Cervical / Trunk Assessment Cervical / Trunk Assessment: Normal   Communication Communication Communication: No difficulties   Cognition Arousal/Alertness: Awake/alert Behavior During Therapy: WFL for tasks assessed/performed;Anxious Overall Cognitive Status: Within Functional  Limits for tasks assessed                                     General Comments  O2 dropped to 81% on 8L with ambulation in room. Pt recovered to 93% within 1 min of returning to sitting EOB and O2 returned down to 5L.     Exercises     Shoulder Instructions      Home Living Family/patient expects to be discharged to:: Private residence Living Arrangements: Spouse/significant other Available Help at Discharge: Family;Available 24 hours/day Type of Home: House Home Access: Stairs to enter [employer] of Steps: front porch- 4-5 steps with B rails but cannot reach at the same time; garage enterance has 5 steps with L ascending rail Entrance Stairs-Rails: Left;Right Home Layout: One level     Bathroom Shower/Tub: Occupational psychologist: Handicapped height Bathroom Accessibility: Yes How Accessible: Accessible via walker Home Equipment: [place] - 2 wheels;Bedside commode   Additional Comments: still driving; manages own meds      Prior Functioning/Environment Level of Independence: Independent                 OT Problem List: Decreased activity tolerance;Impaired balance (sitting and/or standing);Decreased safety awareness;Decreased knowledge of use of DME or AE;Cardiopulmonary status limiting activity      OT Treatment/Interventions: Self-care/ADL training;Therapeutic exercise;Energy conservation;DME and/or AE instruction;Therapeutic activities;Patient/family education;Balance training    OT Goals(Current goals can be found in the care plan section) Acute Rehab OT Goals Patient Stated Goal: be able to move without becoming SOB OT Goal Formulation: With patient Time For Goal Achievement: 07/13/21 Potential to Achieve Goals: Good ADL Goals Pt Will Perform Grooming: with modified independence;standing Pt Will Perform Lower Body Bathing: with modified independence;sitting/lateral leans;sit to/from stand Pt Will Perform Lower Body Dressing: with modified independence;sitting/lateral leans;sit to/from stand Pt Will Transfer to Toilet: with modified independence;ambulating Additional ADL Goal #1: Pt will verbalize 3 energy conservation techniques that she can use at home.   OT Frequency: Min 2X/week   Barriers to D/C:            Co-evaluation              AM-PAC OT "6 Clicks" Daily Activity     Outcome Measure Help from another person eating meals?: None Help from another person taking care of personal grooming?: A Little Help from another person toileting, which includes using toliet, bedpan, or urinal?: A Little Help from another person bathing (including washing, rinsing, drying)?: A Little Help from another person to put on and taking off regular upper body clothing?: A Little Help from another person to put on and taking off regular lower body clothing?: A Little 6 Click Score: 19   End of Session Equipment Utilized During Treatment: Gait belt;Oxygen Nurse Communication: Mobility status  Activity Tolerance: Patient tolerated treatment well Patient left: in bed;with call bell/phone within reach  OT Visit Diagnosis: Unsteadiness on feet (R26.81);Other abnormalities of gait and mobility (R26.89);Muscle weakness (generalized) (M62.81)                Time: 4098-1191 OT Time Calculation (min): 32 min Charges:  OT General Charges $OT Visit: 1 Visit OT Evaluation $OT Eval Moderate Complexity: 1 Mod OT Treatments $Self Care/Home Management : 8-22 mins  Romy Mcgue H., OTR/L Acute Rehabilitation  Jezabel Lecker Elane Juanito Gonyer 06/29/2021, 5:48 PM

## 2021-06-29 NOTE — Progress Notes (Signed)
PROGRESS NOTE        PATIENT DETAILS Name: Tracy Young Age: 75 y.o. Sex: female Date of Birth: 07-10-1946 Admit Date: 06/26/2021 Admitting Physician Lequita Halt, MD YQM:GNOIB, Herbie [place], MD  Brief Narrative: Patient is a 75 y.o. female with history of HLD, CKD stage IIIa, GERD, asthma-who presented with cough x2 weeks-progressively worsening shortness of breath-she was subsequently found to have acute hypoxic respiratory failure in the setting of COVID-19 pneumonia (spouse with recent COVID-19 infection as well) .  Please see below for further details.  Subjective: Thinks she is slowly feeling better-Down to 5-6 L of HFNC.  Objective: Vitals: Blood pressure 133/86, pulse 99, temperature 99.3 F (37.4 C), temperature source Oral, resp. rate 17, height 5' 4.5" (1.638 m), weight 74.6 kg, SpO2 (!) 86 %.   Exam: Gen Exam:Alert awake-not in any distress HEENT:atraumatic, normocephalic Chest: B/L clear to auscultation anteriorly CVS:S1S2 regular Abdomen:soft non tender, non distended Extremities:no edema Neurology: Non focal Skin: no rash  Pertinent Labs/Radiology: WBC: 27.3 Hb: 11.3 PLT: 219 NA: 133 Creatinine: 0.86  Recent Labs    06/27/21 2308 06/29/21 0107  DDIMER 0.60* 0.68*  CRP 8.5* 2.6*    10/10>>Blood culture: No growth  10/11>> CT angio chest: Poor study-no central PE-bilateral confluent groundglass opacities.  Assessment/Plan: Acute hypoxic respiratory failure due to COVID-19 pneumonia: Hypoxia is improving-down to 5-6 L of HFNC today-continue steroids/baricitinib.  No evidence of volume overload-no indication for diuretics.  Continue supportive care-encourage mobilization-use of incentive spirometry/flutter valve.  Continue attempts to slowly titrate down FiO2.  Leukocytosis: Likely due to steroid use-no indication of concurrent bacterial infection.  Watch closely.  HLD: Continue statin  GERD: Continue Pepcid  Asthma: Not in  exacerbation-continue bronchodilators  Procedures: None Consults: None DVT Prophylaxis: Lovenox Code Status:Full code  Family Communication: None at bedside  Time spent:  35 minutes-Greater than 50% of this time was spent in counseling, explantion of diagnosis, planning of further management, and coordination of care.  Diet: Diet Order             Diet Heart Room service appropriate? Yes; Fluid consistency: Thin  Diet effective now                      Disposition Plan: Status is: Inpatient  Remains inpatient appropriate because:Inpatient level of care appropriate due to severity of illness  Dispo: The patient is from: Home              Anticipated d/c is to: Home              Patient currently is not medically stable to d/c.   Difficult to place patient No    Barriers to Discharge: Severe hypoxemia requiring O2 supplementation-on IV steroids/baricitinib.  Antimicrobial agents: Anti-infectives (From admission, onward)    Start     Dose/Rate Route Frequency Ordered Stop   06/29/21 1000  remdesivir 100 mg in sodium chloride 0.9 % 100 mL IVPB  Status:  Discontinued       See Hyperspace for full Linked Orders Report.   100 mg 200 mL/hr over 30 Minutes Intravenous Daily 06/28/21 0450 06/28/21 1126   06/28/21 1000  remdesivir 200 mg in sodium chloride 0.9% 250 mL IVPB  Status:  Discontinued       See Hyperspace for full Linked Orders Report.   200 mg  580 mL/hr over 30 Minutes Intravenous Once 06/28/21 0450 06/28/21 1126   06/27/21 2330  cefTRIAXone (ROCEPHIN) 2 g in sodium chloride 0.9 % 100 mL IVPB  Status:  Discontinued        2 g 200 mL/hr over 30 Minutes Intravenous Every 24 hours 06/27/21 2213 06/28/21 0450   06/27/21 2330  azithromycin (ZITHROMAX) 500 mg in sodium chloride 0.9 % 250 mL IVPB  Status:  Discontinued        500 mg 250 mL/hr over 60 Minutes Intravenous Daily at bedtime 06/27/21 2213 06/28/21 0450   06/27/21 2330  nirmatrelvir/ritonavir EUA  (PAXLOVID) 3 tablet  Status:  Discontinued        3 tablet Oral 2 times daily 06/27/21 2242 06/28/21 0451   06/27/21 0145  nirmatrelvir/ritonavir EUA (PAXLOVID) 3 tablet  Status:  Discontinued        3 tablet Oral 2 times daily 06/27/21 0132 06/27/21 2242   06/27/21 0045  cefTRIAXone (ROCEPHIN) 1 g in sodium chloride 0.9 % 100 mL IVPB        1 g 200 mL/hr over 30 Minutes Intravenous  Once 06/27/21 0030 06/27/21 0138   06/27/21 0045  azithromycin (ZITHROMAX) 500 mg in sodium chloride 0.9 % 250 mL IVPB        500 mg 250 mL/hr over 60 Minutes Intravenous  Once 06/27/21 0030 06/27/21 0215        MEDICATIONS: Scheduled Meds:  albuterol  2 puff Inhalation BID   vitamin C  500 mg Oral Daily   atorvastatin  5 mg Oral Q48H   baricitinib  4 mg Oral Daily   buPROPion  300 mg Oral Daily   DULoxetine  60 mg Oral QHS   enoxaparin (LOVENOX) injection  40 mg Subcutaneous Daily   famotidine  40 mg Oral QHS   methylPREDNISolone (SOLU-MEDROL) injection  80 mg Intravenous QHS   Followed by   Derrill Memo ON 06/30/2021] predniSONE  50 mg Oral QAC supper   pantoprazole  40 mg Oral q morning   raloxifene  60 mg Oral Daily   zinc sulfate  220 mg Oral Daily   Continuous Infusions: PRN Meds:.acetaminophen **OR** acetaminophen, albuterol, guaiFENesin-codeine, ondansetron **OR** ondansetron (ZOFRAN) IV, polyethylene glycol   I have personally reviewed following labs and imaging studies  LABORATORY DATA: CBC: Recent Labs  Lab 06/26/21 2342 06/27/21 2308 06/29/21 0107  WBC 20.9* 25.7* 27.3*  NEUTROABS 15.2* 20.9* 23.7*  HGB 13.2 12.4 11.3*  HCT 39.5 37.3 33.8*  MCV 92.1 93.0 92.9  PLT 282 294 793    Basic Metabolic Panel: Recent Labs  Lab 06/26/21 2342 06/27/21 2308 06/29/21 0107  NA 136 134* 133*  K 3.9 3.9 3.9  CL 101 104 104  CO2 24 21* 22  GLUCOSE 142* 205* 178*  BUN 14 13 15   CREATININE 0.83 0.83 0.86  CALCIUM 9.3 9.3 8.8*  MG  --  2.0 2.0    GFR: Estimated Creatinine  Clearance: 58.3 mL/min (by C-G formula based on SCr of 0.86 mg/dL).  Liver Function Tests: Recent Labs  Lab 06/27/21 2308 06/29/21 0107  AST 26 24  ALT 28 26  ALKPHOS 57 64  BILITOT 0.6 0.8  PROT 6.3* 5.5*  ALBUMIN 3.0* 2.7*   No results for input(s): LIPASE, AMYLASE in the last 168 hours. No results for input(s): AMMONIA in the last 168 hours.  Coagulation Profile: No results for input(s): INR, PROTIME in the last 168 hours.  Cardiac Enzymes: No results for input(s): CKTOTAL, CKMB,  CKMBINDEX, TROPONINI in the last 168 hours.  BNP (last 3 results) No results for input(s): PROBNP in the last 8760 hours.  Lipid Profile: No results for input(s): CHOL, HDL, LDLCALC, TRIG, CHOLHDL, LDLDIRECT in the last 72 hours.  Thyroid Function Tests: No results for input(s): TSH, T4TOTAL, FREET4, T3FREE, THYROIDAB in the last 72 hours.  Anemia Panel: No results for input(s): VITAMINB12, FOLATE, FERRITIN, TIBC, IRON, RETICCTPCT in the last 72 hours.  Urine analysis:    Component Value Date/Time   COLORURINE YELLOW 06/09/2011 1107   APPEARANCEUR CLEAR 06/09/2011 1107   LABSPEC 1.010 06/09/2011 1107   PHURINE 6.5 06/09/2011 1107   GLUCOSEU NEGATIVE 06/09/2011 1107   HGBUR NEGATIVE 06/09/2011 1107   BILIRUBINUR NEGATIVE 06/09/2011 1107   KETONESUR NEGATIVE 06/09/2011 1107   PROTEINUR NEGATIVE 06/09/2011 1107   UROBILINOGEN 0.2 06/09/2011 1107   NITRITE NEGATIVE 06/09/2011 1107   LEUKOCYTESUR TRACE (A) 06/09/2011 1107    Sepsis Labs: Lactic Acid, Venous    Component Value Date/Time   LATICACIDVEN 1.1 06/27/2021 0217    MICROBIOLOGY: Recent Results (from the past 240 hour(s))  Resp Panel by RT-PCR (Flu A&B, Covid) Nasopharyngeal Swab     Status: Abnormal   Collection Time: 06/26/21 11:39 PM   Specimen: Nasopharyngeal Swab; Nasopharyngeal(NP) swabs in vial transport medium  Result Value Ref Range Status   SARS Coronavirus 2 by RT PCR POSITIVE (A) NEGATIVE Final    Comment:  RESULT CALLED TO, READ BACK BY AND VERIFIED WITH: CHANIN MAYNARD 06/27/21 AT 0030 HS (NOTE) SARS-CoV-2 target nucleic acids are DETECTED.  The SARS-CoV-2 RNA is generally detectable in upper respiratory specimens during the acute phase of infection. Positive results are indicative of the presence of the identified virus, but do not rule out bacterial infection or co-infection with other pathogens not detected by the test. Clinical correlation with patient history and other diagnostic information is necessary to determine patient infection status. The expected result is Negative.  Fact Sheet for Patients: EntrepreneurPulse.com.au  Fact Sheet for Healthcare Providers: IncredibleEmployment.be  This test is not yet approved or cleared by the [place] FDA and  has been authorized for detection and/or diagnosis of SARS-CoV-2 by FDA under an Emergency Use Authorization (EUA).  This EUA will remain in effect (meaning this test can b e used) for the duration of  the COVID-19 declaration under Section 564(b)(1) of the Act, 21 U.S.C. section 360bbb-3(b)(1), unless the authorization is terminated or revoked sooner.     Influenza A by PCR NEGATIVE NEGATIVE Final   Influenza B by PCR NEGATIVE NEGATIVE Final    Comment: (NOTE) The Xpert Xpress SARS-CoV-2/FLU/RSV plus assay is intended as an aid in the diagnosis of influenza from Nasopharyngeal swab specimens and should not be used as a sole basis for treatment. Nasal washings and aspirates are unacceptable for Xpert Xpress SARS-CoV-2/FLU/RSV testing.  Fact Sheet for Patients: EntrepreneurPulse.com.au  Fact Sheet for Healthcare Providers: IncredibleEmployment.be  This test is not yet approved or cleared by the [place] FDA and has been authorized for detection and/or diagnosis of SARS-CoV-2 by FDA under an Emergency Use Authorization (EUA). This EUA will  remain in effect (meaning this test can be used) for the duration of the COVID-19 declaration under Section 564(b)(1) of the Act, 21 U.S.C. section 360bbb-3(b)(1), unless the authorization is terminated or revoked.  Performed at [facility], [address] [place], [place] [address]   Blood culture (routine x 2)     Status: None (Preliminary result)   Collection  Time: 06/27/21 12:55 AM   Specimen: BLOOD  Result Value Ref Range Status   Specimen Description   Final    BLOOD Blood Culture adequate volume Performed at [facility], [address], [place], [place] [address]    Special Requests   Final    RIGHT ANTECUBITAL Performed at [facility], [address], [place], [place] [address]    Culture   Final    NO GROWTH 2 DAYS Performed at [facility], [place] [address], [place], [place] [address]    Report Status PENDING  Incomplete  Blood culture (routine x 2)     Status: None (Preliminary result)   Collection Time: 06/27/21 12:55 AM   Specimen: BLOOD  Result Value Ref Range Status   Specimen Description   Final    BLOOD Blood Culture adequate volume Performed at [facility], [address] [place], [place] [address]    Special Requests   Final    BLOOD RIGHT WRIST Performed at [facility], [address] [place], [place] [address]    Culture   Final    NO GROWTH 2 DAYS Performed at [facility], [place] [address] [place], [place] [address]    Report Status PENDING  Incomplete    RADIOLOGY STUDIES/RESULTS: CT Angio Chest Pulmonary Embolism (PE) W or WO Contrast  Result Date: 06/28/2021 CLINICAL DATA:  Positive D-dimer.  Shortness of breath and EXAM: CT ANGIOGRAPHY CHEST WITH CONTRAST TECHNIQUE: Multidetector CT imaging of the chest was performed using the standard protocol during bolus administration of intravenous contrast. Multiplanar CT image  reconstructions and MIPs were obtained to evaluate the vascular anatomy. CONTRAST:  155mL OMNIPAQUE IOHEXOL 350 MG/ML SOLN COMPARISON:  None. FINDINGS: Cardiovascular: Suboptimal opacification of the pulmonary arteries due to bolus timing, limiting evaluation beyond the lobar branches. Within that limitation, there is no central pulmonary embolus. The size of the main pulmonary artery is normal. Heart size is normal, with no pericardial effusion. The course and caliber of the aorta are normal. There is atherosclerotic calcification. No acute aortic syndrome. Mediastinum/Nodes: No mediastinal, hilar or axillary lymphadenopathy. Normal visualized thyroid. Thoracic esophageal course is normal. Lungs/Pleura: Bilateral confluent ground-glass opacities, likely pulmonary edema. No pleural effusion. Upper Abdomen: Contrast bolus timing is not optimized for evaluation of the abdominal organs. The visualized portions of the organs of the upper abdomen are normal. Musculoskeletal: No chest wall abnormality. No bony spinal canal stenosis. Review of the MIP images confirms the above findings. IMPRESSION: 1. Suboptimal opacification of the pulmonary arteries due to bolus timing, limiting evaluation beyond the lobar branches. Within that limitation, there is no central pulmonary embolus. 2. Bilateral confluent ground-glass opacities, likely pulmonary edema. Aortic atherosclerosis (ICD10-I70.0). Electronically Signed   By: Ulyses Jarred M.D.   On: 06/28/2021 02:32     LOS: 2 days   Oren Binet, MD  [facility]    To contact the attending provider between 7A-7P or the covering provider during after hours 7P-7A, please log into the web site [email or web address] and access using universal [place] password for that web site. If you do not have the password, please call the hospital operator.  06/29/2021, 3:49 PM

## 2021-06-29 NOTE — TOC Initial Note (Signed)
Transition of Care [facility]) - Initial/Assessment Note    Patient Details  Name: Tracy Young MRN: [id number] Date of Birth: April 16, 1946  Transition of Care [facility]) CM/SW Contact:    Cyndi Bender, RN Phone Number: 06/29/2021, 2:29 PM  Clinical Narrative: Orders for [place] and DME. Spoke to patient regarding transition needs.Choice offered with list provided per CMS guidelines from medicare.gov website with star ratings (copy placed in shadow chart). Patient agreeable to use in house provider for DME needs.  Spoke to [facility] with [employer] and ordered Rolling walker with seat. Equipment will be delivered to the room prior to discharge.   [facility] for HHPT. Cindie accepted referral.  Patient has transportation home.  Address, phone number and PCP confirmed.       Expected Discharge Plan: [place] Barriers to Discharge: Continued Medical Work up   Patient Goals and CMS Choice Patient states their goals for this hospitalization and ongoing recovery are:: return home CMS Medicare.gov Compare Post Acute Care list provided to:: Patient Choice offered to / list presented to : Patient  Expected Discharge Plan and Services Expected Discharge Plan: [place]   Discharge Planning Services: CM Consult Post Acute Care Choice: Home Health, Durable Medical Equipment Living arrangements for the past 2 months: Single Family Home                 DME Arranged: Walker rolling with seat DME Agency: [employer] Date DME Agency Contacted: 06/29/21 Time DME Agency Contacted: [id number] Representative spoke with at DME Agency: Mardene Celeste HH Arranged: PT [place]: [place] Date [place]: 06/29/21 Time [place]: [place] Representative spoke with at [place]: [place] Arrangements/Services Living arrangements for the past 2 months: [place] with:: Spouse Patient language and need for  interpreter reviewed:: Yes Do you feel safe going back to the place where you live?: Yes      Need for Family Participation in Patient Care: Yes (Comment) Care giver support system in place?: Yes (comment)   Criminal Activity/Legal Involvement Pertinent to Current Situation/Hospitalization: No - Comment as needed  Activities of Daily Living      Permission Sought/Granted Permission sought to share information with : Facility Art therapist granted to share information with : Yes, Verbal Permission Granted     Permission granted to share info w AGENCY: home health and dme        Emotional Assessment Appearance:: Appears older than stated age Attitude/Demeanor/Rapport: Engaged Affect (typically observed): Accepting, Calm Orientation: : Oriented to Situation, Oriented to  Time, Oriented to Place, Oriented to Self Alcohol / Substance Use: Not Applicable Psych Involvement: No (comment)  Admission diagnosis:  Hypoxia [R09.02] CAP (community acquired pneumonia) [J18.9] Community acquired pneumonia of left lower lobe of lung [J18.9] COVID-19 [U07.1] Patient Active Problem List   Diagnosis Date Noted   COVID-19 virus infection 06/27/2021   Acute respiratory failure with hypoxia ([place]) 06/27/2021   Chronic kidney disease, stage 3a ([place]) 06/27/2021   GERD without esophagitis 06/27/2021   Mixed hyperlipidemia 06/27/2021   Mild intermittent asthma with (acute) exacerbation 06/27/2021   Sepsis due to COVID-19 ([place]) 06/27/2021   PCP:  Maury Dus, MD Pharmacy:   [facility] [id number], [place] [address] Phone: [phone] Fax: [phone]     Social Determinants of Health (SDOH) Interventions    Readmission Risk Interventions No flowsheet  data found.

## 2021-06-29 NOTE — Progress Notes (Signed)
   06/28/21 2029 06/29/21 0000  Assess: MEWS Score  Temp  --  98 F (36.7 C)  BP  --  138/61  Pulse Rate  --  (!) 102  ECG Heart Rate  --  (!) 101  Resp  --  (!) 21  Level of Consciousness Alert Alert  SpO2  --  98 %  O2 Device  --  HFNC  Patient Activity (if Appropriate)  --  In bed  O2 Flow Rate (L/min)  --  11 L/min  Assess: MEWS Score  MEWS Temp 0 0  MEWS Systolic 0 0  MEWS Pulse 1 1  MEWS RR 0 1  MEWS LOC 0 0  MEWS Score 1 2  MEWS Score Color Green Yellow  Assess: if the MEWS score is Yellow or Red  Were vital signs taken at a resting state?  --  Yes  Focused Assessment  --  Change from prior assessment (see assessment flowsheet)  Early Detection of Sepsis Score *See Row Information*  --  Medium  MEWS guidelines implemented *See Row Information*  --  Yes  Treat  MEWS Interventions  --  Escalated (See documentation below)  Pain Scale  --  0-10  Pain Score  --  0  Take Vital Signs  Increase Vital Sign Frequency   --  Yellow: Q 2hr X 2 then Q 4hr X 2, if remains yellow, continue Q 4hrs  Escalate  MEWS: Escalate  --  Yellow: discuss with charge nurse/RN and consider discussing with provider and RRT  Notify: Charge Nurse/RN  Name of Charge Nurse/RN Notified  --  Gerald Stabs  Date Charge Nurse/RN Notified  --  06/29/21  Time Charge Nurse/RN Notified  --  0000  Document  Patient Outcome  --  Other (Comment) (VS becoming more stable)  Progress note created (see row info)  --  Yes

## 2021-06-30 DIAGNOSIS — E782 Mixed hyperlipidemia: Secondary | ICD-10-CM | POA: Diagnosis not present

## 2021-06-30 DIAGNOSIS — J4521 Mild intermittent asthma with (acute) exacerbation: Secondary | ICD-10-CM | POA: Diagnosis not present

## 2021-06-30 DIAGNOSIS — U071 COVID-19: Secondary | ICD-10-CM | POA: Diagnosis not present

## 2021-06-30 DIAGNOSIS — J9601 Acute respiratory failure with hypoxia: Secondary | ICD-10-CM | POA: Diagnosis not present

## 2021-06-30 LAB — CBC WITH DIFFERENTIAL/PLATELET
Abs Immature Granulocytes: 0.41 10*3/uL — ABNORMAL HIGH (ref 0.00–0.07)
Basophils Absolute: 0.1 10*3/uL (ref 0.0–0.1)
Basophils Relative: 0 %
Eosinophils Absolute: 0 10*3/uL (ref 0.0–0.5)
Eosinophils Relative: 0 %
HCT: 33.7 % — ABNORMAL LOW (ref 36.0–46.0)
Hemoglobin: 11.5 g/dL — ABNORMAL LOW (ref 12.0–15.0)
Immature Granulocytes: 2 %
Lymphocytes Relative: 7 %
Lymphs Abs: 1.6 10*3/uL (ref 0.7–4.0)
MCH: 31.3 pg (ref 26.0–34.0)
MCHC: 34.1 g/dL (ref 30.0–36.0)
MCV: 91.6 fL (ref 80.0–100.0)
Monocytes Absolute: 0.9 10*3/uL (ref 0.1–1.0)
Monocytes Relative: 4 %
Neutro Abs: 21.4 10*3/uL — ABNORMAL HIGH (ref 1.7–7.7)
Neutrophils Relative %: 87 %
Platelets: 283 10*3/uL (ref 150–400)
RBC: 3.68 MIL/uL — ABNORMAL LOW (ref 3.87–5.11)
RDW: 12.4 % (ref 11.5–15.5)
WBC: 24.3 10*3/uL — ABNORMAL HIGH (ref 4.0–10.5)
nRBC: 0 % (ref 0.0–0.2)

## 2021-06-30 LAB — COMPREHENSIVE METABOLIC PANEL
ALT: 31 U/L (ref 0–44)
AST: 24 U/L (ref 15–41)
Albumin: 2.7 g/dL — ABNORMAL LOW (ref 3.5–5.0)
Alkaline Phosphatase: 73 U/L (ref 38–126)
Anion gap: 9 (ref 5–15)
BUN: 17 mg/dL (ref 8–23)
CO2: 22 mmol/L (ref 22–32)
Calcium: 8.9 mg/dL (ref 8.9–10.3)
Chloride: 102 mmol/L (ref 98–111)
Creatinine, Ser: 0.75 mg/dL (ref 0.44–1.00)
GFR, Estimated: >60 mL/min (ref >60)
Glucose, Bld: 201 mg/dL — ABNORMAL HIGH (ref 70–99)
Potassium: 4.2 mmol/L (ref 3.5–5.1)
Sodium: 133 mmol/L — ABNORMAL LOW (ref 135–145)
Total Bilirubin: 0.6 mg/dL (ref 0.3–1.2)
Total Protein: 5.7 g/dL — ABNORMAL LOW (ref 6.5–8.1)

## 2021-06-30 LAB — PROCALCITONIN: Procalcitonin: <0.1 ng/mL

## 2021-06-30 LAB — D-DIMER, QUANTITATIVE: D-Dimer, Quant: 1.13 ug/mL-FEU — ABNORMAL HIGH (ref 0.00–0.50)

## 2021-06-30 LAB — MAGNESIUM: Magnesium: 2.1 mg/dL (ref 1.7–2.4)

## 2021-06-30 LAB — C-REACTIVE PROTEIN: CRP: 2.6 mg/dL — ABNORMAL HIGH (ref <1.0)

## 2021-06-30 MED ORDER — FUROSEMIDE 10 MG/ML IJ SOLN
20.0000 mg | Freq: Once | INTRAMUSCULAR | Status: AC
  Administered 2021-06-30: 20 mg via INTRAVENOUS
  Filled 2021-06-30: qty 2

## 2021-06-30 NOTE — Progress Notes (Signed)
Physical Therapy Treatment Patient Details Name: Tracy Young MRN: [id number] DOB: 1946-06-05 Today's Date: 06/30/2021   History of Present Illness 75yo female who presented on 10/9 with progressive SOB, cough, and weakness. Found to be hypoxic to 84% on room air and tested positive for Covid. Transferred from [facility] ED to [facility] for ongoing care. PMH CKD, HDL, TKR, asthma    PT Comments    Patient at rest on 4L with sats 94%; with talking decreased to 88%. Increased O2 to 8L for activity. Ambulated 60 ft with sats dropping at end to 76% HR 122. After 2 minutes of recovery (educated on pursed lip breathing technique), sats 88% HR 104. Ambulated a second time x 30 ft with similar response. By end of session, pt back down to 4L at rest with sats 91%    Recommendations for follow up therapy are one component of a multi-disciplinary discharge planning process, led by the attending physician.  Recommendations may be updated based on patient status, additional functional criteria and insurance authorization.  Follow Up Recommendations  Home health PT     Equipment Recommendations  Other (comment) (shower seat and rollator)    Recommendations for Other Services       Precautions / Restrictions Precautions Precautions: Fall Precaution Comments: Covid +, watch sats and HR     Mobility  Bed Mobility               General bed mobility comments: sitting at EOB    Transfers Overall transfer level: Needs assistance Equipment used: 4-wheeled walker Transfers: Sit to/from Stand Sit to Stand: Min guard         General transfer comment: Min g for safety and line management  Ambulation/Gait Ambulation/Gait assistance: Min guard Gait Distance (Feet): 60 Feet (seated rest; 30) Assistive device: 4-wheeled walker Gait Pattern/deviations: Step-through pattern;Decreased stride length;Wide base of support     General Gait Details: pt appropriately self-selects slower velocity;  cues for wider steps to improve balance   Stairs             Wheelchair Mobility    Modified Rankin (Stroke Patients Only)       Balance Overall balance assessment: Mild deficits observed, not formally tested                                          Cognition Arousal/Alertness: Awake/alert Behavior During Therapy: [facility] for tasks assessed/performed;Anxious Overall Cognitive Status: Within Functional Limits for tasks assessed                                        Exercises Other Exercises Other Exercises: educated on AROM throughout the day (specifically pt return demonstrated ankle pumps, LAQ, seated marching, shoulder flexion, elbow flexion, sit to stand)    General Comments General comments (skin integrity, edema, etc.): on 4L at rest with sats decreasing 94 to 88% with talking only; incr to 8L for activity with sats dropping to 76% during first walk (60 ft) and improved to 88% within 2 minutes with PLB (pt educated on technique); dropped to 80% on 8L during second walk (30 ft) and recovered in 1.5 minutes to 88%. By end of session pt back down to 4L with sats 91%      Pertinent Vitals/Pain Pain Assessment: No/denies pain  Home Living                      Prior Function            PT Goals (current goals can now be found in the care plan section) Acute Rehab PT Goals Patient Stated Goal: be able to move without becoming SOB PT Goal Formulation: With patient/family Time For Goal Achievement: 07/12/21 Potential to Achieve Goals: Fair Progress towards PT goals: Progressing toward goals    Frequency    Min 3X/week      PT Plan Current plan remains appropriate    Co-evaluation              AM-PAC PT "6 Clicks" Mobility   Outcome Measure  Help needed turning from your back to your side while in a flat bed without using bedrails?: None Help needed moving from lying on your back to sitting on the side of  a flat bed without using bedrails?: None Help needed moving to and from a bed to a chair (including a wheelchair)?: A Little Help needed standing up from a chair using your arms (e.g., wheelchair or bedside chair)?: A Little Help needed to walk in hospital room?: A Little Help needed climbing 3-5 steps with a railing? : A Lot 6 Click Score: 19    End of Session Equipment Utilized During Treatment: Oxygen Activity Tolerance: Patient tolerated treatment well Patient left: in bed;with call bell/phone within reach;with family/visitor present (sitting EOB (prefers to recliner)) Nurse Communication: Mobility status;Other (comment) (desat with mobility) PT Visit Diagnosis: Muscle weakness (generalized) (M62.81);Unsteadiness on feet (R26.81);Other abnormalities of gait and mobility (R26.89);Difficulty in walking, not elsewhere classified (R26.2)     Time: 7209-4709 PT Time Calculation (min) (ACUTE ONLY): 20 min  Charges:  $Gait Training: 8-22 mins                      Arby Barrette, PT Pager [phone]    LEOTTA WEINGARTEN 06/30/2021, 10:59 AM

## 2021-06-30 NOTE — Progress Notes (Signed)
PROGRESS NOTE        PATIENT DETAILS Name: Tracy Young Age: 75 y.o. Sex: female Date of Birth: 1946-05-07 Admit Date: 06/26/2021 Admitting Physician Lequita Halt, MD QMV:[id number], Herbie [place], MD  Brief Narrative: Patient is a 75 y.o. female with history of HLD, CKD stage IIIa, GERD, asthma-who presented with cough x2 weeks-progressively worsening shortness of breath-she was subsequently found to have acute hypoxic respiratory failure in the setting of COVID-19 pneumonia (spouse with recent COVID-19 infection as well) .  Please see below for further details.  Subjective: Continues to feel better-but does have exertional dyspnea with minimal movement.  Down to 4 L of oxygen this morning.  Objective: Vitals: Blood pressure (!) 146/76, pulse (!) 106, temperature 98.2 F (36.8 C), temperature source Oral, resp. rate (!) 29, height 5' 4.5" (1.638 m), weight 74.6 kg, SpO2 95 %.   Exam: Gen Exam:Alert awake-not in any distress HEENT:atraumatic, normocephalic Chest: B/L clear to auscultation anteriorly CVS:S1S2 regular Abdomen:soft non tender, non distended Extremities:no edema Neurology: Non focal Skin: no rash   Pertinent Labs/Radiology: WBC: 24.3 Hb: 11.5 PLT: 283 NA: 133 creatinine: 0.75 Recent Labs    06/27/21 2308 06/29/21 0107 06/30/21 0136  DDIMER 0.60* 0.68* 1.13*  CRP 8.5* 2.6* 2.6*     10/10>>Blood culture: No growth  10/11>> CT angio chest: Poor study-no central PE-bilateral confluent groundglass opacities.  Assessment/Plan: Acute hypoxic respiratory failure due to COVID-19 pneumonia: Continues to slowly improve-Down to 4 L of oxygen this morning.  Continue steroids/baricitinib-1 dose of IV Lasix to ensure negative balance as she has some slight lower extremity edema.  Continue to titrate down FiO2-continue encouragement to use incentive spirometry/flutter valve lowly titrate down FiO2.  Leukocytosis: Likely due to steroid use-no  indication of concurrent bacterial infection.  Watch closely.  HLD: Continue statin  GERD: Continue Pepcid  Asthma: Not in exacerbation-continue bronchodilators  Procedures: None Consults: None DVT Prophylaxis: Lovenox Code Status:Full code  Family Communication: Spouse at bedside  Time spent:  63 minutes-Greater than 50% of this time was spent in counseling, explantion of diagnosis, planning of further management, and coordination of care.  Diet: Diet Order             Diet Heart Room service appropriate? Yes; Fluid consistency: Thin  Diet effective now                      Disposition Plan: Status is: Inpatient  Remains inpatient appropriate because:Inpatient level of care appropriate due to severity of illness  Dispo: The patient is from: Home              Anticipated d/c is to: Home              Patient currently is not medically stable to d/c.   Difficult to place patient No    Barriers to Discharge: Severe hypoxemia requiring O2 supplementation-on IV steroids/baricitinib.  Antimicrobial agents: Anti-infectives (From admission, onward)    Start     Dose/Rate Route Frequency Ordered Stop   06/29/21 1000  remdesivir 100 mg in sodium chloride 0.9 % 100 mL IVPB  Status:  Discontinued       See Hyperspace for full Linked Orders Report.   100 mg 200 mL/hr over 30 Minutes Intravenous Daily 06/28/21 0450 06/28/21 1126   06/28/21 1000  remdesivir 200 mg in sodium  chloride 0.9% 250 mL IVPB  Status:  Discontinued       See Hyperspace for full Linked Orders Report.   200 mg 580 mL/hr over 30 Minutes Intravenous Once 06/28/21 0450 06/28/21 1126   06/27/21 2330  cefTRIAXone (ROCEPHIN) 2 g in sodium chloride 0.9 % 100 mL IVPB  Status:  Discontinued        2 g 200 mL/hr over 30 Minutes Intravenous Every 24 hours 06/27/21 2213 06/28/21 0450   06/27/21 2330  azithromycin (ZITHROMAX) 500 mg in sodium chloride 0.9 % 250 mL IVPB  Status:  Discontinued        500 mg 250  mL/hr over 60 Minutes Intravenous Daily at bedtime 06/27/21 2213 06/28/21 0450   06/27/21 2330  nirmatrelvir/ritonavir EUA (PAXLOVID) 3 tablet  Status:  Discontinued        3 tablet Oral 2 times daily 06/27/21 2242 06/28/21 0451   06/27/21 0145  nirmatrelvir/ritonavir EUA (PAXLOVID) 3 tablet  Status:  Discontinued        3 tablet Oral 2 times daily 06/27/21 0132 06/27/21 2242   06/27/21 0045  cefTRIAXone (ROCEPHIN) 1 g in sodium chloride 0.9 % 100 mL IVPB        1 g 200 mL/hr over 30 Minutes Intravenous  Once 06/27/21 0030 06/27/21 0138   06/27/21 0045  azithromycin (ZITHROMAX) 500 mg in sodium chloride 0.9 % 250 mL IVPB        500 mg 250 mL/hr over 60 Minutes Intravenous  Once 06/27/21 0030 06/27/21 0215        MEDICATIONS: Scheduled Meds:  albuterol  2 puff Inhalation BID   vitamin C  500 mg Oral Daily   atorvastatin  5 mg Oral Q48H   baricitinib  4 mg Oral Daily   buPROPion  300 mg Oral Daily   DULoxetine  60 mg Oral QHS   enoxaparin (LOVENOX) injection  40 mg Subcutaneous Daily   famotidine  40 mg Oral QHS   furosemide  20 mg Intravenous Once   pantoprazole  40 mg Oral q morning   predniSONE  50 mg Oral QAC supper   raloxifene  60 mg Oral Daily   zinc sulfate  220 mg Oral Daily   Continuous Infusions: PRN Meds:.acetaminophen **OR** acetaminophen, albuterol, guaiFENesin-codeine, ondansetron **OR** ondansetron (ZOFRAN) IV, polyethylene glycol   I have personally reviewed following labs and imaging studies  LABORATORY DATA: CBC: Recent Labs  Lab 06/26/21 2342 06/27/21 2308 06/29/21 0107 06/30/21 0136  WBC 20.9* 25.7* 27.3* 24.3*  NEUTROABS 15.2* 20.9* 23.7* 21.4*  HGB 13.2 12.4 11.3* 11.5*  HCT 39.5 37.3 33.8* 33.7*  MCV 92.1 93.0 92.9 91.6  PLT 282 294 290 283     Basic Metabolic Panel: Recent Labs  Lab 06/26/21 2342 06/27/21 2308 06/29/21 0107 06/30/21 0136  NA 136 134* 133* 133*  K 3.9 3.9 3.9 4.2  CL 101 104 104 102  CO2 24 21* 22 22  GLUCOSE  142* 205* 178* 201*  BUN 14 13 15 17   CREATININE 0.83 0.83 0.86 0.75  CALCIUM 9.3 9.3 8.8* 8.9  MG  --  2.0 2.0 2.1     GFR: Estimated Creatinine Clearance: 62.7 mL/min (by C-G formula based on SCr of 0.75 mg/dL).  Liver Function Tests: Recent Labs  Lab 06/27/21 2308 06/29/21 0107 06/30/21 0136  AST 26 24 24   ALT 28 26 31   ALKPHOS 57 64 73  BILITOT 0.6 0.8 0.6  PROT 6.3* 5.5* 5.7*  ALBUMIN 3.0* 2.7* 2.7*  No results for input(s): LIPASE, AMYLASE in the last 168 hours. No results for input(s): AMMONIA in the last 168 hours.  Coagulation Profile: No results for input(s): INR, PROTIME in the last 168 hours.  Cardiac Enzymes: No results for input(s): CKTOTAL, CKMB, CKMBINDEX, TROPONINI in the last 168 hours.  BNP (last 3 results) No results for input(s): PROBNP in the last 8760 hours.  Lipid Profile: No results for input(s): CHOL, HDL, LDLCALC, TRIG, CHOLHDL, LDLDIRECT in the last 72 hours.  Thyroid Function Tests: No results for input(s): TSH, T4TOTAL, FREET4, T3FREE, THYROIDAB in the last 72 hours.  Anemia Panel: No results for input(s): VITAMINB12, FOLATE, FERRITIN, TIBC, IRON, RETICCTPCT in the last 72 hours.  Urine analysis:    Component Value Date/Time   COLORURINE YELLOW 06/09/2011 1107   APPEARANCEUR CLEAR 06/09/2011 1107   LABSPEC 1.010 06/09/2011 1107   PHURINE 6.5 06/09/2011 1107   GLUCOSEU NEGATIVE 06/09/2011 1107   HGBUR NEGATIVE 06/09/2011 1107   BILIRUBINUR NEGATIVE 06/09/2011 1107   KETONESUR NEGATIVE 06/09/2011 1107   PROTEINUR NEGATIVE 06/09/2011 1107   UROBILINOGEN 0.2 06/09/2011 1107   NITRITE NEGATIVE 06/09/2011 1107   LEUKOCYTESUR TRACE (A) 06/09/2011 1107    Sepsis Labs: Lactic Acid, Venous    Component Value Date/Time   LATICACIDVEN 1.1 06/27/2021 0217    MICROBIOLOGY: Recent Results (from the past 240 hour(s))  Resp Panel by RT-PCR (Flu A&B, Covid) Nasopharyngeal Swab     Status: Abnormal   Collection Time: 06/26/21 11:39  PM   Specimen: Nasopharyngeal Swab; Nasopharyngeal(NP) swabs in vial transport medium  Result Value Ref Range Status   SARS Coronavirus 2 by RT PCR POSITIVE (A) NEGATIVE Final    Comment: RESULT CALLED TO, READ BACK BY AND VERIFIED WITH: CHANIN MAYNARD 06/27/21 AT 0030 HS (NOTE) SARS-CoV-2 target nucleic acids are DETECTED.  The SARS-CoV-2 RNA is generally detectable in upper respiratory specimens during the acute phase of infection. Positive results are indicative of the presence of the identified virus, but do not rule out bacterial infection or co-infection with other pathogens not detected by the test. Clinical correlation with patient history and other diagnostic information is necessary to determine patient infection status. The expected result is Negative.  Fact Sheet for Patients: EntrepreneurPulse.com.au  Fact Sheet for Healthcare Providers: IncredibleEmployment.be  This test is not yet approved or cleared by the [place] FDA and  has been authorized for detection and/or diagnosis of SARS-CoV-2 by FDA under an Emergency Use Authorization (EUA).  This EUA will remain in effect (meaning this test can b e used) for the duration of  the COVID-19 declaration under Section 564(b)(1) of the Act, 21 U.S.C. section 360bbb-3(b)(1), unless the authorization is terminated or revoked sooner.     Influenza A by PCR NEGATIVE NEGATIVE Final   Influenza B by PCR NEGATIVE NEGATIVE Final    Comment: (NOTE) The Xpert Xpress SARS-CoV-2/FLU/RSV plus assay is intended as an aid in the diagnosis of influenza from Nasopharyngeal swab specimens and should not be used as a sole basis for treatment. Nasal washings and aspirates are unacceptable for Xpert Xpress SARS-CoV-2/FLU/RSV testing.  Fact Sheet for Patients: EntrepreneurPulse.com.au  Fact Sheet for Healthcare Providers: IncredibleEmployment.be  This test is  not yet approved or cleared by the [place] FDA and has been authorized for detection and/or diagnosis of SARS-CoV-2 by FDA under an Emergency Use Authorization (EUA). This EUA will remain in effect (meaning this test can be used) for the duration of the COVID-19 declaration under Section 564(b)(1) of  the Act, 21 U.S.C. section 360bbb-3(b)(1), unless the authorization is terminated or revoked.  Performed at [facility], [address] [place], [place] [address]   Blood culture (routine x 2)     Status: None (Preliminary result)   Collection Time: 06/27/21 12:55 AM   Specimen: BLOOD  Result Value Ref Range Status   Specimen Description   Final    BLOOD Blood Culture adequate volume Performed at [facility], [address], [place], [place] [address]    Special Requests   Final    RIGHT ANTECUBITAL Performed at [facility], [address] [place], [place] [address]    Culture   Final    NO GROWTH 3 DAYS Performed at [facility], [place] [address], [place], [place] [address]    Report Status PENDING  Incomplete  Blood culture (routine x 2)     Status: None (Preliminary result)   Collection Time: 06/27/21 12:55 AM   Specimen: BLOOD  Result Value Ref Range Status   Specimen Description   Final    BLOOD Blood Culture adequate volume Performed at [facility], [address] [place], [place] [address]    Special Requests   Final    BLOOD RIGHT WRIST Performed at [facility], [address] [place], [place] [address]    Culture   Final    NO GROWTH 3 DAYS Performed at [facility], [place] [address], [place],  [address]    Report Status PENDING  Incomplete    RADIOLOGY STUDIES/RESULTS: No results found.   LOS: 3 days   Oren Binet, MD  [facility]    To contact the attending provider between 7A-7P or the covering provider during  after hours 7P-7A, please log into the web site [email or web address] and access using universal [place] password for that web site. If you do not have the password, please call the hospital operator.  06/30/2021, 1:59 PM

## 2021-06-30 NOTE — Plan of Care (Signed)
  Problem: Education: Goal: Knowledge of General Education information will improve Description: Including pain rating scale, medication(s)/side effects and non-pharmacologic comfort measures Outcome: Progressing   Problem: Health Behavior/Discharge Planning: Goal: Ability to manage health-related needs will improve Outcome: Progressing   Problem: Clinical Measurements: Goal: Ability to maintain clinical measurements within normal limits will improve Outcome: Not Progressing

## 2021-07-01 DIAGNOSIS — E782 Mixed hyperlipidemia: Secondary | ICD-10-CM | POA: Diagnosis not present

## 2021-07-01 DIAGNOSIS — J4521 Mild intermittent asthma with (acute) exacerbation: Secondary | ICD-10-CM | POA: Diagnosis not present

## 2021-07-01 DIAGNOSIS — U071 COVID-19: Secondary | ICD-10-CM | POA: Diagnosis not present

## 2021-07-01 DIAGNOSIS — J9601 Acute respiratory failure with hypoxia: Secondary | ICD-10-CM | POA: Diagnosis not present

## 2021-07-01 LAB — COMPREHENSIVE METABOLIC PANEL
ALT: 31 U/L (ref 0–44)
AST: 22 U/L (ref 15–41)
Albumin: 2.6 g/dL — ABNORMAL LOW (ref 3.5–5.0)
Alkaline Phosphatase: 67 U/L (ref 38–126)
Anion gap: 8 (ref 5–15)
BUN: 18 mg/dL (ref 8–23)
CO2: 24 mmol/L (ref 22–32)
Calcium: 8.9 mg/dL (ref 8.9–10.3)
Chloride: 99 mmol/L (ref 98–111)
Creatinine, Ser: 0.82 mg/dL (ref 0.44–1.00)
GFR, Estimated: >60 mL/min (ref >60)
Glucose, Bld: 240 mg/dL — ABNORMAL HIGH (ref 70–99)
Potassium: 4.2 mmol/L (ref 3.5–5.1)
Sodium: 131 mmol/L — ABNORMAL LOW (ref 135–145)
Total Bilirubin: 0.5 mg/dL (ref 0.3–1.2)
Total Protein: 5.5 g/dL — ABNORMAL LOW (ref 6.5–8.1)

## 2021-07-01 LAB — CBC WITH DIFFERENTIAL/PLATELET
Abs Immature Granulocytes: 0.41 10*3/uL — ABNORMAL HIGH (ref 0.00–0.07)
Basophils Absolute: 0 10*3/uL (ref 0.0–0.1)
Basophils Relative: 0 %
Eosinophils Absolute: 0 10*3/uL (ref 0.0–0.5)
Eosinophils Relative: 0 %
HCT: 32.3 % — ABNORMAL LOW (ref 36.0–46.0)
Hemoglobin: 11.1 g/dL — ABNORMAL LOW (ref 12.0–15.0)
Immature Granulocytes: 2 %
Lymphocytes Relative: 11 %
Lymphs Abs: 2.3 10*3/uL (ref 0.7–4.0)
MCH: 31 pg (ref 26.0–34.0)
MCHC: 34.4 g/dL (ref 30.0–36.0)
MCV: 90.2 fL (ref 80.0–100.0)
Monocytes Absolute: 1.3 10*3/uL — ABNORMAL HIGH (ref 0.1–1.0)
Monocytes Relative: 6 %
Neutro Abs: 16.9 10*3/uL — ABNORMAL HIGH (ref 1.7–7.7)
Neutrophils Relative %: 81 %
Platelets: 285 10*3/uL (ref 150–400)
RBC: 3.58 MIL/uL — ABNORMAL LOW (ref 3.87–5.11)
RDW: 12.1 % (ref 11.5–15.5)
WBC: 20.9 10*3/uL — ABNORMAL HIGH (ref 4.0–10.5)
nRBC: 0 % (ref 0.0–0.2)

## 2021-07-01 LAB — C-REACTIVE PROTEIN: CRP: 1.3 mg/dL — ABNORMAL HIGH (ref <1.0)

## 2021-07-01 LAB — D-DIMER, QUANTITATIVE: D-Dimer, Quant: 1 ug/mL-FEU — ABNORMAL HIGH (ref 0.00–0.50)

## 2021-07-01 LAB — MAGNESIUM: Magnesium: 2.2 mg/dL (ref 1.7–2.4)

## 2021-07-01 MED ORDER — PHENOL 1.4 % MT LIQD
1.0000 | OROMUCOSAL | Status: DC | PRN
Start: 2021-07-01 — End: 2021-07-04

## 2021-07-01 MED ORDER — PREDNISONE 20 MG PO TABS
40.0000 mg | ORAL_TABLET | Freq: Every day | ORAL | Status: DC
  Administered 2021-07-01 – 2021-07-03 (×3): 40 mg via ORAL
  Filled 2021-07-01 (×3): qty 2

## 2021-07-01 NOTE — Progress Notes (Signed)
PROGRESS NOTE        PATIENT DETAILS Name: Tracy Young Age: 75 y.o. Sex: female Date of Birth: Sep 29, 1945 Admit Date: 06/26/2021 Admitting Physician Lequita Halt, MD [id number], Herbie [place], MD  Brief Narrative: Patient is a 75 y.o. female with history of HLD, CKD stage IIIa, GERD, asthma-who presented with cough x2 weeks-progressively worsening shortness of breath-she was subsequently found to have acute hypoxic respiratory failure in the setting of COVID-19 pneumonia (spouse with recent COVID-19 infection as well) .  Please see below for further details.  Subjective: Continues to slowly improve-less shortness of breath-less cough-continues to have exertional dyspnea which is ambulating around in the room.  However at rest-down to around 3-4 L of oxygen consistently..  Objective: Vitals: Blood pressure 137/74, pulse 92, temperature 98.9 F (37.2 C), temperature source Oral, resp. rate 20, height 5' 4.5" (1.638 m), weight 74.6 kg, SpO2 91 %.   Exam: Gen Exam:Alert awake-not in any distress HEENT:atraumatic, normocephalic Chest: B/L clear to auscultation anteriorly CVS:S1S2 regular Abdomen:soft non tender, non distended Extremities:no edema Neurology: Non focal Skin: no rash   Pertinent Labs/Radiology: WBC: 20.9 Hb: 11.1  PLT: 285  NA: 131 creatinine: 0.82  Recent Labs    06/29/21 0107 06/30/21 0136 07/01/21 0209  DDIMER 0.68* 1.13* 1.00*  CRP 2.6* 2.6* 1.3*     10/10>>Blood culture: No growth  10/11>> CT angio chest: Poor study-no central PE-bilateral confluent groundglass opacities.  Assessment/Plan: Acute hypoxic respiratory failure due to COVID-19 pneumonia: Improving-Down to 3-4 L of oxygen at rest-still with significant exertional dyspnea-continue tapering steroids and baricitinib.  No volume overload today-do not think patient needs diuretics.  Continue mobilization-and attempt to decrease down FiO2.  Leukocytosis: Likely due to  steroid use-no indication of concurrent bacterial infection.  Watch closely.  HLD: Continue statin  GERD: Continue Pepcid  Asthma: Not in exacerbation-continue bronchodilators  Procedures: None Consults: None DVT Prophylaxis: Lovenox Code Status:Full code  Family Communication: None at bedside  Time spent:  35 minutes-Greater than 50% of this time was spent in counseling, explantion of diagnosis, planning of further management, and coordination of care.  Diet: Diet Order             Diet Heart Room service appropriate? Yes; Fluid consistency: Thin  Diet effective now                      Disposition Plan: Status is: Inpatient  Remains inpatient appropriate because:Inpatient level of care appropriate due to severity of illness  Dispo: The patient is from: Home              Anticipated d/c is to: Home              Patient currently is not medically stable to d/c.   Difficult to place patient No    Barriers to Discharge: Severe hypoxemia requiring O2 supplementation-on IV steroids/baricitinib.  Antimicrobial agents: Anti-infectives (From admission, onward)    Start     Dose/Rate Route Frequency Ordered Stop   06/29/21 1000  remdesivir 100 mg in sodium chloride 0.9 % 100 mL IVPB  Status:  Discontinued       See Hyperspace for full Linked Orders Report.   100 mg 200 mL/hr over 30 Minutes Intravenous Daily 06/28/21 0450 06/28/21 1126   06/28/21 1000  remdesivir 200 mg in sodium chloride 0.9%  250 mL IVPB  Status:  Discontinued       See Hyperspace for full Linked Orders Report.   200 mg 580 mL/hr over 30 Minutes Intravenous Once 06/28/21 0450 06/28/21 1126   06/27/21 2330  cefTRIAXone (ROCEPHIN) 2 g in sodium chloride 0.9 % 100 mL IVPB  Status:  Discontinued        2 g 200 mL/hr over 30 Minutes Intravenous Every 24 hours 06/27/21 2213 06/28/21 0450   06/27/21 2330  azithromycin (ZITHROMAX) 500 mg in sodium chloride 0.9 % 250 mL IVPB  Status:  Discontinued         500 mg 250 mL/hr over 60 Minutes Intravenous Daily at bedtime 06/27/21 2213 06/28/21 0450   06/27/21 2330  nirmatrelvir/ritonavir EUA (PAXLOVID) 3 tablet  Status:  Discontinued        3 tablet Oral 2 times daily 06/27/21 2242 06/28/21 0451   06/27/21 0145  nirmatrelvir/ritonavir EUA (PAXLOVID) 3 tablet  Status:  Discontinued        3 tablet Oral 2 times daily 06/27/21 0132 06/27/21 2242   06/27/21 0045  cefTRIAXone (ROCEPHIN) 1 g in sodium chloride 0.9 % 100 mL IVPB        1 g 200 mL/hr over 30 Minutes Intravenous  Once 06/27/21 0030 06/27/21 0138   06/27/21 0045  azithromycin (ZITHROMAX) 500 mg in sodium chloride 0.9 % 250 mL IVPB        500 mg 250 mL/hr over 60 Minutes Intravenous  Once 06/27/21 0030 06/27/21 0215        MEDICATIONS: Scheduled Meds:  albuterol  2 puff Inhalation BID   vitamin C  500 mg Oral Daily   atorvastatin  5 mg Oral Q48H   baricitinib  4 mg Oral Daily   buPROPion  300 mg Oral Daily   DULoxetine  60 mg Oral QHS   enoxaparin (LOVENOX) injection  40 mg Subcutaneous Daily   famotidine  40 mg Oral QHS   pantoprazole  40 mg Oral q morning   predniSONE  50 mg Oral QAC supper   raloxifene  60 mg Oral Daily   zinc sulfate  220 mg Oral Daily   Continuous Infusions: PRN Meds:.acetaminophen **OR** acetaminophen, albuterol, guaiFENesin-codeine, ondansetron **OR** ondansetron (ZOFRAN) IV, phenol, polyethylene glycol   I have personally reviewed following labs and imaging studies  LABORATORY DATA: CBC: Recent Labs  Lab 06/26/21 2342 06/27/21 2308 06/29/21 0107 06/30/21 0136 07/01/21 0209  WBC 20.9* 25.7* 27.3* 24.3* 20.9*  NEUTROABS 15.2* 20.9* 23.7* 21.4* 16.9*  HGB 13.2 12.4 11.3* 11.5* 11.1*  HCT 39.5 37.3 33.8* 33.7* 32.3*  MCV 92.1 93.0 92.9 91.6 90.2  PLT 282 294 290 283 285     Basic Metabolic Panel: Recent Labs  Lab 06/26/21 2342 06/27/21 2308 06/29/21 0107 06/30/21 0136 07/01/21 0209  NA 136 134* 133* 133* 131*  K 3.9 3.9 3.9 4.2 4.2   CL 101 104 104 102 99  CO2 24 21* 22 22 24   GLUCOSE 142* 205* 178* 201* 240*  BUN 14 13 15 17 18   CREATININE 0.83 0.83 0.86 0.75 0.82  CALCIUM 9.3 9.3 8.8* 8.9 8.9  MG  --  2.0 2.0 2.1 2.2     GFR: Estimated Creatinine Clearance: 61.2 mL/min (by C-G formula based on SCr of 0.82 mg/dL).  Liver Function Tests: Recent Labs  Lab 06/27/21 2308 06/29/21 0107 06/30/21 0136 07/01/21 0209  AST 26 24 24 22   ALT 28 26 31 31   ALKPHOS 57 64 73 67  BILITOT 0.6 0.8 0.6 0.5  PROT 6.3* 5.5* 5.7* 5.5*  ALBUMIN 3.0* 2.7* 2.7* 2.6*    No results for input(s): LIPASE, AMYLASE in the last 168 hours. No results for input(s): AMMONIA in the last 168 hours.  Coagulation Profile: No results for input(s): INR, PROTIME in the last 168 hours.  Cardiac Enzymes: No results for input(s): CKTOTAL, CKMB, CKMBINDEX, TROPONINI in the last 168 hours.  BNP (last 3 results) No results for input(s): PROBNP in the last 8760 hours.  Lipid Profile: No results for input(s): CHOL, HDL, LDLCALC, TRIG, CHOLHDL, LDLDIRECT in the last 72 hours.  Thyroid Function Tests: No results for input(s): TSH, T4TOTAL, FREET4, T3FREE, THYROIDAB in the last 72 hours.  Anemia Panel: No results for input(s): VITAMINB12, FOLATE, FERRITIN, TIBC, IRON, RETICCTPCT in the last 72 hours.  Urine analysis:    Component Value Date/Time   COLORURINE YELLOW 06/09/2011 1107   APPEARANCEUR CLEAR 06/09/2011 1107   LABSPEC 1.010 06/09/2011 1107   PHURINE 6.5 06/09/2011 1107   GLUCOSEU NEGATIVE 06/09/2011 1107   HGBUR NEGATIVE 06/09/2011 1107   BILIRUBINUR NEGATIVE 06/09/2011 1107   KETONESUR NEGATIVE 06/09/2011 1107   PROTEINUR NEGATIVE 06/09/2011 1107   UROBILINOGEN 0.2 06/09/2011 1107   NITRITE NEGATIVE 06/09/2011 1107   LEUKOCYTESUR TRACE (A) 06/09/2011 1107    Sepsis Labs: Lactic Acid, Venous    Component Value Date/Time   LATICACIDVEN 1.1 06/27/2021 0217    MICROBIOLOGY: Recent Results (from the past 240 hour(s))   Resp Panel by RT-PCR (Flu A&B, Covid) Nasopharyngeal Swab     Status: Abnormal   Collection Time: 06/26/21 11:39 PM   Specimen: Nasopharyngeal Swab; Nasopharyngeal(NP) swabs in vial transport medium  Result Value Ref Range Status   SARS Coronavirus 2 by RT PCR POSITIVE (A) NEGATIVE Final    Comment: RESULT CALLED TO, READ BACK BY AND VERIFIED WITH: CHANIN MAYNARD 06/27/21 AT 0030 HS (NOTE) SARS-CoV-2 target nucleic acids are DETECTED.  The SARS-CoV-2 RNA is generally detectable in upper respiratory specimens during the acute phase of infection. Positive results are indicative of the presence of the identified virus, but do not rule out bacterial infection or co-infection with other pathogens not detected by the test. Clinical correlation with patient history and other diagnostic information is necessary to determine patient infection status. The expected result is Negative.  Fact Sheet for Patients: EntrepreneurPulse.com.au  Fact Sheet for Healthcare Providers: IncredibleEmployment.be  This test is not yet approved or cleared by the [place] FDA and  has been authorized for detection and/or diagnosis of SARS-CoV-2 by FDA under an Emergency Use Authorization (EUA).  This EUA will remain in effect (meaning this test can b e used) for the duration of  the COVID-19 declaration under Section 564(b)(1) of the Act, 21 U.S.C. section 360bbb-3(b)(1), unless the authorization is terminated or revoked sooner.     Influenza A by PCR NEGATIVE NEGATIVE Final   Influenza B by PCR NEGATIVE NEGATIVE Final    Comment: (NOTE) The Xpert Xpress SARS-CoV-2/FLU/RSV plus assay is intended as an aid in the diagnosis of influenza from Nasopharyngeal swab specimens and should not be used as a sole basis for treatment. Nasal washings and aspirates are unacceptable for Xpert Xpress SARS-CoV-2/FLU/RSV testing.  Fact Sheet for  Patients: EntrepreneurPulse.com.au  Fact Sheet for Healthcare Providers: IncredibleEmployment.be  This test is not yet approved or cleared by the [place] FDA and has been authorized for detection and/or diagnosis of SARS-CoV-2 by FDA under an Emergency Use Authorization (EUA). This EUA will  remain in effect (meaning this test can be used) for the duration of the COVID-19 declaration under Section 564(b)(1) of the Act, 21 U.S.C. section 360bbb-3(b)(1), unless the authorization is terminated or revoked.  Performed at [facility], [address] [place], [place] [address]   Blood culture (routine x 2)     Status: None (Preliminary result)   Collection Time: 06/27/21 12:55 AM   Specimen: BLOOD  Result Value Ref Range Status   Specimen Description   Final    BLOOD Blood Culture adequate volume Performed at [facility], [address], [place], [place] [address]    Special Requests   Final    RIGHT ANTECUBITAL Performed at [facility], [address], [place], [place] [address]    Culture   Final    NO GROWTH 4 DAYS Performed at [facility], [place] [address] [place], [place] [address]    Report Status PENDING  Incomplete  Blood culture (routine x 2)     Status: None (Preliminary result)   Collection Time: 06/27/21 12:55 AM   Specimen: BLOOD  Result Value Ref Range Status   Specimen Description   Final    BLOOD Blood Culture adequate volume Performed at [facility], [address], [place], [place] [address]    Special Requests   Final    BLOOD RIGHT WRIST Performed at [facility], [address] [place], [place] [address]    Culture   Final    NO GROWTH 4 DAYS Performed at [facility], [place] [address] [place], [place] [address]    Report Status PENDING  Incomplete    RADIOLOGY STUDIES/RESULTS: No results  found.   LOS: 4 days   Oren Binet, MD  [facility]    To contact the attending provider between 7A-7P or the covering provider during after hours 7P-7A, please log into the web site [email or web address] and access using universal [place] password for that web site. If you do not have the password, please call the hospital operator.  07/01/2021, 12:19 PM

## 2021-07-01 NOTE — Progress Notes (Signed)
Physical Therapy Treatment Patient Details Name: Tracy Young MRN: [id number] DOB: [birth year] Today's Date: 07/01/2021   History of Present Illness 75yo female who presented on 10/9 with progressive SOB, cough, and weakness. Found to be hypoxic to 84% on room air and tested positive for Covid. Transferred from [place] [facility] to [facility] for ongoing care. PMH CKD, HDL, TKR, asthma    PT Comments    Pt continues to be limited in mobility distance by her deficits in aerobic endurance. Pt tends to be able to maintain her SpO2 levels >/= 89% on 6L while ambulating slowly for up to ~25 ft. However, after ~25 ft her sats decrease to as low as mid-70s%, requiring several minutes of resting in standing or sitting to rebound. Educated pt on pacing self, sitting up, pursed lip breathing, use of respiratory devices, weaning O2, and continuing to challenge her endurance through exercises. Performed x10 sit <> stand reps to improve lower extremity strength and challenge her endurance, requiring several minutes to rebound her sats afterwards also. Will continue to follow acutely. Current recommendations remain appropriate.     Recommendations for follow up therapy are one component of a multi-disciplinary discharge planning process, led by the attending physician.  Recommendations may be updated based on patient status, additional functional criteria and insurance authorization.  Follow Up Recommendations  Home health PT     Equipment Recommendations  Other (comment) (shower seat and rollator)    Recommendations for Other Services       Precautions / Restrictions Precautions Precautions: Fall Precaution Comments: Covid +, watch sats and HR Restrictions Weight Bearing Restrictions: No     Mobility  Bed Mobility Overal bed mobility: Modified Independent             General bed mobility comments: Pt able to transition supine > sit with HOB elevated without assistance.    Transfers Overall  transfer level: Needs assistance Equipment used: 4-wheeled walker Transfers: Sit to/from Stand Sit to Stand: Supervision         General transfer comment: Supervision for safety and line management, no LOB.  Ambulation/Gait Ambulation/Gait assistance: Min guard;Supervision Gait Distance (Feet): 40 Feet (x2 bouts of ~40 ft each bout) Assistive device: 4-wheeled walker Gait Pattern/deviations: Step-through pattern;Decreased stride length Gait velocity: reduced Gait velocity interpretation: <1.31 ft/sec, indicative of household ambulator General Gait Details: pt appropriately self-selects slower velocity, coordinating pursed lip breathing with steps. educated pt to slow gait as needed to maintain good sat reading. At ~25 ft distance each bout her sats would decrease from remaining >/= 89% on 6L to mid 70s%, requiring pt to either stand or sit to rest to rebound sats in several minutes. No LOB, min guard-supervision for safety and line management.   Stairs             Wheelchair Mobility    Modified Rankin (Stroke Patients Only)       Balance Overall balance assessment: Mild deficits observed, not formally tested                                          Cognition Arousal/Alertness: Awake/alert Behavior During Therapy: WFL for tasks assessed/performed;Anxious Overall Cognitive Status: Within Functional Limits for tasks assessed  Exercises Other Exercises Other Exercises: Sit <> stand 10x without UE support    General Comments General comments (skin integrity, edema, etc.): On 3L at rest with SpO2 >/= 92%, able to maintain SpO2 >/= 89% on 6L with gait up to ~25 ft but then sats would decrease to 80s then mid-70s%, requiring pt to stand or sit to rest and allow sats to rebound within a couple minutes; educated pt on weaning self safely through monitoring symptoms and O2 via pulse ox; educated pt on  sitting up and using respiratory devices to improve pulmonary function; educated pt to perform exercises involving multiple parts of the body to challenge her endurance, like seated simultaneous arm flexion and contralateral hip flexion with knee extension or sit <> stands without UE support      Pertinent Vitals/Pain Pain Assessment: Faces Faces Pain Scale: Hurts a little bit Pain Location: chest when coughing/congestion Pain Descriptors / Indicators: Discomfort;Grimacing Pain Intervention(s): Limited activity within patient's tolerance;Monitored during session;Repositioned;Other (comment) (educated pt on flutter valve and incentive spirometer)    Home Living                      Prior Function            PT Goals (current goals can now be found in the care plan section) Acute Rehab PT Goals Patient Stated Goal: to improve PT Goal Formulation: With patient/family Time For Goal Achievement: 07/12/21 Potential to Achieve Goals: Fair Progress towards PT goals: Progressing toward goals    Frequency    Min 3X/week      PT Plan Current plan remains appropriate    Co-evaluation              AM-PAC PT "6 Clicks" Mobility   Outcome Measure  Help needed turning from your back to your side while in a flat bed without using bedrails?: None Help needed moving from lying on your back to sitting on the side of a flat bed without using bedrails?: None Help needed moving to and from a bed to a chair (including a wheelchair)?: A Little Help needed standing up from a chair using your arms (e.g., wheelchair or bedside chair)?: A Little Help needed to walk in hospital room?: A Little Help needed climbing 3-5 steps with a railing? : A Lot 6 Click Score: 19    End of Session Equipment Utilized During Treatment: Oxygen Activity Tolerance: Patient tolerated treatment well Patient left: in bed;with call bell/phone within reach;with family/visitor present (sitting EOB)   PT  Visit Diagnosis: Muscle weakness (generalized) (M62.81);Unsteadiness on feet (R26.81);Other abnormalities of gait and mobility (R26.89);Difficulty in walking, not elsewhere classified (R26.2)     Time: 4656-8127 PT Time Calculation (min) (ACUTE ONLY): 30 min  Charges:  $Gait Training: 8-22 mins $Therapeutic Exercise: 8-22 mins                     Moishe Spice, PT, DPT Acute Rehabilitation Services  Pager: [phone] Office: [place] 07/01/2021, 5:24 PM

## 2021-07-01 NOTE — Plan of Care (Signed)

## 2021-07-02 DIAGNOSIS — J9601 Acute respiratory failure with hypoxia: Secondary | ICD-10-CM | POA: Diagnosis not present

## 2021-07-02 DIAGNOSIS — E782 Mixed hyperlipidemia: Secondary | ICD-10-CM | POA: Diagnosis not present

## 2021-07-02 DIAGNOSIS — J4521 Mild intermittent asthma with (acute) exacerbation: Secondary | ICD-10-CM | POA: Diagnosis not present

## 2021-07-02 DIAGNOSIS — K219 Gastro-esophageal reflux disease without esophagitis: Secondary | ICD-10-CM | POA: Diagnosis not present

## 2021-07-02 LAB — COMPREHENSIVE METABOLIC PANEL
ALT: 41 U/L (ref 0–44)
AST: 24 U/L (ref 15–41)
Albumin: 2.6 g/dL — ABNORMAL LOW (ref 3.5–5.0)
Alkaline Phosphatase: 70 U/L (ref 38–126)
Anion gap: 7 (ref 5–15)
BUN: 17 mg/dL (ref 8–23)
CO2: 24 mmol/L (ref 22–32)
Calcium: 8.8 mg/dL — ABNORMAL LOW (ref 8.9–10.3)
Chloride: 100 mmol/L (ref 98–111)
Creatinine, Ser: 0.81 mg/dL (ref 0.44–1.00)
GFR, Estimated: >60 mL/min (ref >60)
Glucose, Bld: 211 mg/dL — ABNORMAL HIGH (ref 70–99)
Potassium: 4.3 mmol/L (ref 3.5–5.1)
Sodium: 131 mmol/L — ABNORMAL LOW (ref 135–145)
Total Bilirubin: 0.7 mg/dL (ref 0.3–1.2)
Total Protein: 5.4 g/dL — ABNORMAL LOW (ref 6.5–8.1)

## 2021-07-02 LAB — CBC WITH DIFFERENTIAL/PLATELET
Abs Immature Granulocytes: 0.69 10*3/uL — ABNORMAL HIGH (ref 0.00–0.07)
Basophils Absolute: 0.1 10*3/uL (ref 0.0–0.1)
Basophils Relative: 0 %
Eosinophils Absolute: 0 10*3/uL (ref 0.0–0.5)
Eosinophils Relative: 0 %
HCT: 32.5 % — ABNORMAL LOW (ref 36.0–46.0)
Hemoglobin: 11.2 g/dL — ABNORMAL LOW (ref 12.0–15.0)
Immature Granulocytes: 4 %
Lymphocytes Relative: 12 %
Lymphs Abs: 2.2 10*3/uL (ref 0.7–4.0)
MCH: 31.3 pg (ref 26.0–34.0)
MCHC: 34.5 g/dL (ref 30.0–36.0)
MCV: 90.8 fL (ref 80.0–100.0)
Monocytes Absolute: 0.7 10*3/uL (ref 0.1–1.0)
Monocytes Relative: 4 %
Neutro Abs: 14.2 10*3/uL — ABNORMAL HIGH (ref 1.7–7.7)
Neutrophils Relative %: 80 %
Platelets: 289 10*3/uL (ref 150–400)
RBC: 3.58 MIL/uL — ABNORMAL LOW (ref 3.87–5.11)
RDW: 12.2 % (ref 11.5–15.5)
WBC: 17.8 10*3/uL — ABNORMAL HIGH (ref 4.0–10.5)
nRBC: 0 % (ref 0.0–0.2)

## 2021-07-02 LAB — CULTURE, BLOOD (ROUTINE X 2)
Culture: NO GROWTH
Culture: NO GROWTH
Specimen Description: ADEQUATE
Specimen Description: ADEQUATE

## 2021-07-02 LAB — D-DIMER, QUANTITATIVE: D-Dimer, Quant: 1.33 ug/mL-FEU — ABNORMAL HIGH (ref 0.00–0.50)

## 2021-07-02 LAB — MAGNESIUM: Magnesium: 2.2 mg/dL (ref 1.7–2.4)

## 2021-07-02 LAB — C-REACTIVE PROTEIN: CRP: 0.7 mg/dL (ref <1.0)

## 2021-07-02 NOTE — TOC Progression Note (Signed)
Transition of Care [facility]) - Progression Note    Patient Details  Name: Tracy Young MRN: [id number] Date of Birth: 08-Dec-1945  [facility] CM/SW Contact  Bartholomew Crews, RN Phone Number: [phone] 07/02/2021, 2:32 PM  Clinical Narrative:     Notified by MD of anticipated transition home tomorrow and will need oxygen. Referral to [facility]. Noted previous CM had placed referral to [employer] for rollator. HH PT/OT orders in place - previous CM had arranged HH with [facility]. Notified Cindie with [facility] of anticipated discharge tomorrow. TOC following for transition needs.   Expected Discharge Plan: [place] Barriers to Discharge: Continued Medical Work up  Expected Discharge Plan and Services Expected Discharge Plan: [place]   Discharge Planning Services: CM Consult Post Acute Care Choice: Home Health, Durable Medical Equipment Living arrangements for the past 2 months: Single Family Home                 DME Arranged: Oxygen, Walker rolling with seat DME Agency: [facility] Date DME Agency Contacted: 07/02/21 Time DME Agency Contacted: [id number] Representative spoke with at DME Agency: [place]: PT, OT HH Agency: [place] Date [place]: 07/02/21 Time [place]: [place] Representative spoke with at [place]: Cindie   Social Determinants of Health ([place]) Interventions    Readmission Risk Interventions No flowsheet data found.

## 2021-07-02 NOTE — Progress Notes (Signed)
SATURATION QUALIFICATIONS: (This note is used to comply with regulatory documentation for home oxygen)  Patient Saturations on Room Air at Rest = n/a%  Patient Saturations on Room Air while Ambulating = n/a%  Patient Saturations on 8 Liters of oxygen while Ambulating = 88%  Please briefly explain why patient needs home oxygen: Pt on 3L [place] at rest saturating 96%. Requiring O2 to be increased to 6L then 8L while ambulating ~120 feet to maintain saturations above 88%.

## 2021-07-02 NOTE — Progress Notes (Signed)
PROGRESS NOTE        PATIENT DETAILS Name: Tracy Young Age: 75 y.o. Sex: female Date of Birth: 10-13-1945 Admit Date: 06/26/2021 Admitting Physician Lequita Halt, MD AYT:[id number], Herbie [place], MD  Brief Narrative: Patient is a 75 y.o. female with history of HLD, CKD stage IIIa, GERD, asthma-who presented with cough x2 weeks-progressively worsening shortness of breath-she was subsequently found to have acute hypoxic respiratory failure in the setting of COVID-19 pneumonia (spouse with recent COVID-19 infection as well) .  Please see below for further details.  Subjective: Thinks she feels better than yesterday-ambulated with RN-required 6 L with ambulation-around 2 L with rest.  Patient wanting to go home today-however after discussion with husband-they want to wait another day or so to see if clinical improvement continues before going home.  Objective: Vitals: Blood pressure 130/68, pulse 98, temperature 98.7 F (37.1 C), temperature source Oral, resp. rate 20, height 5' 4.5" (1.638 m), weight 75.1 kg, SpO2 100 %.   Exam: Gen Exam:Alert awake-not in any distress HEENT:atraumatic, normocephalic Chest: B/L clear to auscultation anteriorly CVS:S1S2 regular Abdomen:soft non tender, non distended Extremities:no edema Neurology: Non focal Skin: no rash   Pertinent Labs/Radiology: WBC: 17.8 Hb: 11.2  PLT: 289 NA: 131 creatinine: 0.81  Recent Labs    06/30/21 0136 07/01/21 0209 07/02/21 0210  DDIMER 1.13* 1.00* 1.33*  CRP 2.6* 1.3* 0.7     10/10>>Blood culture: No growth  10/11>> CT angio chest: Poor study-no central PE-bilateral confluent groundglass opacities.  Assessment/Plan: Acute hypoxic respiratory failure due to COVID-19 pneumonia: Continues to improve-Down to 3 L of oxygen at rest-requires around 6 L of oxygen with ambulation.  Patient initially requested discharge but now wants to wait another day or so to ensure clinical improvement before  she goes home.  Have ordered home O2 and home health services.  Continue steroids/baricitinib-volume status stable-no indication for diuretics.  Leukocytosis: Likely due to steroid use-no indication of concurrent bacterial infection.  Watch closely.  HLD: Continue statin  GERD: Continue Pepcid  Asthma: Not in exacerbation-continue bronchodilators  Procedures: None Consults: None DVT Prophylaxis: Lovenox Code Status:Full code  Family Communication: None at bedside  Time spent:  25 minutes-Greater than 50% of this time was spent in counseling, explantion of diagnosis, planning of further management, and coordination of care.  Diet: Diet Order             Diet Heart Room service appropriate? Yes; Fluid consistency: Thin  Diet effective now                      Disposition Plan: Status is: Inpatient  Remains inpatient appropriate because:Inpatient level of care appropriate due to severity of illness  Dispo: The patient is from: Home              Anticipated d/c is to: Home              Patient currently is not medically stable to d/c.   Difficult to place patient No    Barriers to Discharge: Severe hypoxemia requiring O2 supplementation-on IV steroids/baricitinib.  Antimicrobial agents: Anti-infectives (From admission, onward)    Start     Dose/Rate Route Frequency Ordered Stop   06/29/21 1000  remdesivir 100 mg in sodium chloride 0.9 % 100 mL IVPB  Status:  Discontinued  See Hyperspace for full Linked Orders Report.   100 mg 200 mL/hr over 30 Minutes Intravenous Daily 06/28/21 0450 06/28/21 1126   06/28/21 1000  remdesivir 200 mg in sodium chloride 0.9% 250 mL IVPB  Status:  Discontinued       See Hyperspace for full Linked Orders Report.   200 mg 580 mL/hr over 30 Minutes Intravenous Once 06/28/21 0450 06/28/21 1126   06/27/21 2330  cefTRIAXone (ROCEPHIN) 2 g in sodium chloride 0.9 % 100 mL IVPB  Status:  Discontinued        2 g 200 mL/hr over 30  Minutes Intravenous Every 24 hours 06/27/21 2213 06/28/21 0450   06/27/21 2330  azithromycin (ZITHROMAX) 500 mg in sodium chloride 0.9 % 250 mL IVPB  Status:  Discontinued        500 mg 250 mL/hr over 60 Minutes Intravenous Daily at bedtime 06/27/21 2213 06/28/21 0450   06/27/21 2330  nirmatrelvir/ritonavir EUA (PAXLOVID) 3 tablet  Status:  Discontinued        3 tablet Oral 2 times daily 06/27/21 2242 06/28/21 0451   06/27/21 0145  nirmatrelvir/ritonavir EUA (PAXLOVID) 3 tablet  Status:  Discontinued        3 tablet Oral 2 times daily 06/27/21 0132 06/27/21 2242   06/27/21 0045  cefTRIAXone (ROCEPHIN) 1 g in sodium chloride 0.9 % 100 mL IVPB        1 g 200 mL/hr over 30 Minutes Intravenous  Once 06/27/21 0030 06/27/21 0138   06/27/21 0045  azithromycin (ZITHROMAX) 500 mg in sodium chloride 0.9 % 250 mL IVPB        500 mg 250 mL/hr over 60 Minutes Intravenous  Once 06/27/21 0030 06/27/21 0215        MEDICATIONS: Scheduled Meds:  albuterol  2 puff Inhalation BID   vitamin C  500 mg Oral Daily   atorvastatin  5 mg Oral Q48H   baricitinib  4 mg Oral Daily   buPROPion  300 mg Oral Daily   DULoxetine  60 mg Oral QHS   enoxaparin (LOVENOX) injection  40 mg Subcutaneous Daily   famotidine  40 mg Oral QHS   pantoprazole  40 mg Oral q morning   predniSONE  40 mg Oral QAC supper   raloxifene  60 mg Oral Daily   zinc sulfate  220 mg Oral Daily   Continuous Infusions: PRN Meds:.acetaminophen **OR** acetaminophen, albuterol, guaiFENesin-codeine, ondansetron **OR** ondansetron (ZOFRAN) IV, phenol, polyethylene glycol   I have personally reviewed following labs and imaging studies  LABORATORY DATA: CBC: Recent Labs  Lab 06/27/21 2308 06/29/21 0107 06/30/21 0136 07/01/21 0209 07/02/21 0210  WBC 25.7* 27.3* 24.3* 20.9* 17.8*  NEUTROABS 20.9* 23.7* 21.4* 16.9* 14.2*  HGB 12.4 11.3* 11.5* 11.1* 11.2*  HCT 37.3 33.8* 33.7* 32.3* 32.5*  MCV 93.0 92.9 91.6 90.2 90.8  PLT 294 290 283  285 289     Basic Metabolic Panel: Recent Labs  Lab 06/27/21 2308 06/29/21 0107 06/30/21 0136 07/01/21 0209 07/02/21 0210  NA 134* 133* 133* 131* 131*  K 3.9 3.9 4.2 4.2 4.3  CL 104 104 102 99 100  CO2 21* 22 22 24 24   GLUCOSE 205* 178* 201* 240* 211*  BUN 13 15 17 18 17   CREATININE 0.83 0.86 0.75 0.82 0.81  CALCIUM 9.3 8.8* 8.9 8.9 8.8*  MG 2.0 2.0 2.1 2.2 2.2     GFR: Estimated Creatinine Clearance: 62.1 mL/min (by C-G formula based on SCr of 0.81 mg/dL).  Liver  Function Tests: Recent Labs  Lab 06/27/21 2308 06/29/21 0107 06/30/21 0136 07/01/21 0209 07/02/21 0210  AST 26 24 24 22 24   ALT 28 26 31 31  41  ALKPHOS 57 64 73 67 70  BILITOT 0.6 0.8 0.6 0.5 0.7  PROT 6.3* 5.5* 5.7* 5.5* 5.4*  ALBUMIN 3.0* 2.7* 2.7* 2.6* 2.6*    No results for input(s): LIPASE, AMYLASE in the last 168 hours. No results for input(s): AMMONIA in the last 168 hours.  Coagulation Profile: No results for input(s): INR, PROTIME in the last 168 hours.  Cardiac Enzymes: No results for input(s): CKTOTAL, CKMB, CKMBINDEX, TROPONINI in the last 168 hours.  BNP (last 3 results) No results for input(s): PROBNP in the last 8760 hours.  Lipid Profile: No results for input(s): CHOL, HDL, LDLCALC, TRIG, CHOLHDL, LDLDIRECT in the last 72 hours.  Thyroid Function Tests: No results for input(s): TSH, T4TOTAL, FREET4, T3FREE, THYROIDAB in the last 72 hours.  Anemia Panel: No results for input(s): VITAMINB12, FOLATE, FERRITIN, TIBC, IRON, RETICCTPCT in the last 72 hours.  Urine analysis:    Component Value Date/Time   COLORURINE YELLOW 06/09/2011 1107   APPEARANCEUR CLEAR 06/09/2011 1107   LABSPEC 1.010 06/09/2011 1107   PHURINE 6.5 06/09/2011 1107   GLUCOSEU NEGATIVE 06/09/2011 1107   HGBUR NEGATIVE 06/09/2011 1107   BILIRUBINUR NEGATIVE 06/09/2011 1107   KETONESUR NEGATIVE 06/09/2011 1107   PROTEINUR NEGATIVE 06/09/2011 1107   UROBILINOGEN 0.2 06/09/2011 1107   NITRITE NEGATIVE  06/09/2011 1107   LEUKOCYTESUR TRACE (A) 06/09/2011 1107    Sepsis Labs: Lactic Acid, Venous    Component Value Date/Time   LATICACIDVEN 1.1 06/27/2021 0217    MICROBIOLOGY: Recent Results (from the past 240 hour(s))  Resp Panel by RT-PCR (Flu A&B, Covid) Nasopharyngeal Swab     Status: Abnormal   Collection Time: 06/26/21 11:39 PM   Specimen: Nasopharyngeal Swab; Nasopharyngeal(NP) swabs in vial transport medium  Result Value Ref Range Status   SARS Coronavirus 2 by RT PCR POSITIVE (A) NEGATIVE Final    Comment: RESULT CALLED TO, READ BACK BY AND VERIFIED WITH: CHANIN MAYNARD 06/27/21 AT 0030 HS (NOTE) SARS-CoV-2 target nucleic acids are DETECTED.  The SARS-CoV-2 RNA is generally detectable in upper respiratory specimens during the acute phase of infection. Positive results are indicative of the presence of the identified virus, but do not rule out bacterial infection or co-infection with other pathogens not detected by the test. Clinical correlation with patient history and other diagnostic information is necessary to determine patient infection status. The expected result is Negative.  Fact Sheet for Patients: EntrepreneurPulse.com.au  Fact Sheet for Healthcare Providers: IncredibleEmployment.be  This test is not yet approved or cleared by the [place] FDA and  has been authorized for detection and/or diagnosis of SARS-CoV-2 by FDA under an Emergency Use Authorization (EUA).  This EUA will remain in effect (meaning this test can b e used) for the duration of  the COVID-19 declaration under Section 564(b)(1) of the Act, 21 U.S.C. section 360bbb-3(b)(1), unless the authorization is terminated or revoked sooner.     Influenza A by PCR NEGATIVE NEGATIVE Final   Influenza B by PCR NEGATIVE NEGATIVE Final    Comment: (NOTE) The Xpert Xpress SARS-CoV-2/FLU/RSV plus assay is intended as an aid in the diagnosis of influenza from  Nasopharyngeal swab specimens and should not be used as a sole basis for treatment. Nasal washings and aspirates are unacceptable for Xpert Xpress SARS-CoV-2/FLU/RSV testing.  Fact Sheet for Patients: EntrepreneurPulse.com.au  Fact Sheet for Healthcare Providers: IncredibleEmployment.be  This test is not yet approved or cleared by the [place] FDA and has been authorized for detection and/or diagnosis of SARS-CoV-2 by FDA under an Emergency Use Authorization (EUA). This EUA will remain in effect (meaning this test can be used) for the duration of the COVID-19 declaration under Section 564(b)(1) of the Act, 21 U.S.C. section 360bbb-3(b)(1), unless the authorization is terminated or revoked.  Performed at [facility], [address] [place], [place] [address]   Blood culture (routine x 2)     Status: None   Collection Time: 06/27/21 12:55 AM   Specimen: BLOOD  Result Value Ref Range Status   Specimen Description   Final    BLOOD Blood Culture adequate volume Performed at [facility], [address], [place], [place] [address]    Special Requests   Final    RIGHT ANTECUBITAL Performed at [facility], [address] [place], [place] [address]    Culture   Final    NO GROWTH 5 DAYS Performed at [facility], [place] [address], [place], [place] [address]    Report Status 07/02/2021 FINAL  Final  Blood culture (routine x 2)     Status: None   Collection Time: 06/27/21 12:55 AM   Specimen: BLOOD  Result Value Ref Range Status   Specimen Description   Final    BLOOD Blood Culture adequate volume Performed at [facility], [address], [place], [place] [address]    Special Requests   Final    BLOOD RIGHT WRIST Performed at [facility], [address] [place], [place] [address]    Culture   Final    NO GROWTH 5  DAYS Performed at [facility], [place] [address], [place], [place] [address]    Report Status 07/02/2021 FINAL  Final    RADIOLOGY STUDIES/RESULTS: No results found.   LOS: 5 days   Oren Binet, MD  [facility]    To contact the attending provider between 7A-7P or the covering provider during after hours 7P-7A, please log into the web site [email or web address] and access using universal [place] password for that web site. If you do not have the password, please call the hospital operator.  07/02/2021, 1:51 PM

## 2021-07-03 ENCOUNTER — Inpatient Hospital Stay (HOSPITAL_COMMUNITY): Payer: Medicare Other

## 2021-07-03 DIAGNOSIS — R7989 Other specified abnormal findings of blood chemistry: Secondary | ICD-10-CM

## 2021-07-03 DIAGNOSIS — E782 Mixed hyperlipidemia: Secondary | ICD-10-CM | POA: Diagnosis not present

## 2021-07-03 DIAGNOSIS — J9601 Acute respiratory failure with hypoxia: Secondary | ICD-10-CM | POA: Diagnosis not present

## 2021-07-03 DIAGNOSIS — U071 COVID-19: Secondary | ICD-10-CM

## 2021-07-03 DIAGNOSIS — J4521 Mild intermittent asthma with (acute) exacerbation: Secondary | ICD-10-CM | POA: Diagnosis not present

## 2021-07-03 DIAGNOSIS — K219 Gastro-esophageal reflux disease without esophagitis: Secondary | ICD-10-CM | POA: Diagnosis not present

## 2021-07-03 LAB — D-DIMER, QUANTITATIVE: D-Dimer, Quant: >20 ug/mL-FEU — ABNORMAL HIGH (ref 0.00–0.50)

## 2021-07-03 LAB — BASIC METABOLIC PANEL
Anion gap: 9 (ref 5–15)
BUN: 17 mg/dL (ref 8–23)
CO2: 23 mmol/L (ref 22–32)
Calcium: 8.8 mg/dL — ABNORMAL LOW (ref 8.9–10.3)
Chloride: 102 mmol/L (ref 98–111)
Creatinine, Ser: 0.73 mg/dL (ref 0.44–1.00)
GFR, Estimated: >60 mL/min (ref >60)
Glucose, Bld: 182 mg/dL — ABNORMAL HIGH (ref 70–99)
Potassium: 4.2 mmol/L (ref 3.5–5.1)
Sodium: 134 mmol/L — ABNORMAL LOW (ref 135–145)

## 2021-07-03 LAB — CBC
HCT: 33.5 % — ABNORMAL LOW (ref 36.0–46.0)
Hemoglobin: 11.6 g/dL — ABNORMAL LOW (ref 12.0–15.0)
MCH: 31.2 pg (ref 26.0–34.0)
MCHC: 34.6 g/dL (ref 30.0–36.0)
MCV: 90.1 fL (ref 80.0–100.0)
Platelets: 307 10*3/uL (ref 150–400)
RBC: 3.72 MIL/uL — ABNORMAL LOW (ref 3.87–5.11)
RDW: 12.2 % (ref 11.5–15.5)
WBC: 17.7 10*3/uL — ABNORMAL HIGH (ref 4.0–10.5)
nRBC: 0 % (ref 0.0–0.2)

## 2021-07-03 MED ORDER — ENOXAPARIN SODIUM 40 MG/0.4ML IJ SOSY
40.0000 mg | PREFILLED_SYRINGE | Freq: Two times a day (BID) | INTRAMUSCULAR | Status: DC
  Administered 2021-07-03 – 2021-07-04 (×2): 40 mg via SUBCUTANEOUS
  Filled 2021-07-03 (×2): qty 0.4

## 2021-07-03 MED ORDER — ENOXAPARIN SODIUM 80 MG/0.8ML IJ SOSY
75.0000 mg | PREFILLED_SYRINGE | INTRAMUSCULAR | Status: AC
  Administered 2021-07-03: 75 mg via SUBCUTANEOUS
  Filled 2021-07-03: qty 0.8

## 2021-07-03 MED ORDER — ENOXAPARIN SODIUM 80 MG/0.8ML IJ SOSY: 75.0000 mg | PREFILLED_SYRINGE | Freq: Two times a day (BID) | INTRAMUSCULAR | Status: DC

## 2021-07-03 MED ORDER — IOHEXOL 350 MG/ML SOLN
75.0000 mL | Freq: Once | INTRAVENOUS | Status: AC | PRN
  Administered 2021-07-03: 75 mL via INTRAVENOUS

## 2021-07-03 NOTE — Progress Notes (Signed)
VASCULAR LAB    Bilateral lower extremity venous duplex has been performed.  See CV proc for preliminary results.   Yoni Lobos, RVT 07/03/2021, 11:34 AM

## 2021-07-03 NOTE — Progress Notes (Signed)
PROGRESS NOTE        PATIENT DETAILS Name: Tracy Young Age: 75 y.o. Sex: female Date of Birth: 05/12/1946 Admit Date: 06/26/2021 Admitting Physician Lequita Halt, MD [id number], Herbie [place], MD  Brief Narrative: Patient is a 75 y.o. female with history of HLD, CKD stage IIIa, GERD, asthma-who presented with cough x2 weeks-progressively worsening shortness of breath-she was subsequently found to have acute hypoxic respiratory failure in the setting of COVID-19 pneumonia (spouse with recent COVID-19 infection as well) .  Please see below for further details.  Subjective: Stable on 2 L of oxygen at rest-required 6 L of oxygen with ambulation.  Significant jump in D-dimer overnight.  She feels essentially the same as yesterday.  Claims she walked the hallway yesterday without major issues.  Objective: Vitals: Blood pressure 131/61, pulse 97, temperature 98.2 F (36.8 C), temperature source Oral, resp. rate 20, height 5' 4.5" (1.638 m), weight 75.1 kg, SpO2 91 %.   Exam: Gen Exam:Alert awake-not in any distress HEENT:atraumatic, normocephalic Chest: B/L clear to auscultation anteriorly CVS:S1S2 regular Abdomen:soft non tender, non distended Extremities:no edema Neurology: Non focal Skin: no rash   Pertinent Labs/Radiology: WBC: 17.8 Hb: 11.2  PLT: 289 NA: 131 creatinine: 0.81  Recent Labs    07/01/21 0209 07/02/21 0210 07/03/21 0120  DDIMER 1.00* 1.33* >20.00*  CRP 1.3* 0.7  --      10/10>>Blood culture: No growth  10/11>> CTA chest: Poor study-no central PE-bilateral confluent groundglass opacities. 10/16>> CTA chest: No PE, diffuse groundglass opacities-felt to be slightly improved. 10/16>> bilateral lower extremity Dopplers: Negative for DVT  Assessment/Plan: Acute hypoxic respiratory failure due to COVID-19 pneumonia: Slowly improving-Down to 2 L of oxygen at rest and required around 6 L to ambulate.  Continue steroids/baricitinib for 1  more day-if she remains stable overnight-we will plan to discharge home on 10/17.  Significant jump in D-dimer overnight-CTA chest/Dopplers negative for DVT-could be a lab error-repeat D-dimers tomorrow.  Stop therapeutic dosing of Lovenox-we will switch to intermediate dosing instead.  Leukocytosis: Likely due to steroid use-no indication of concurrent bacterial infection.  Watch closely.  HLD: Continue statin  GERD: Continue Pepcid  Asthma: Not in exacerbation-continue bronchodilators  Procedures: None Consults: None DVT Prophylaxis: Lovenox Code Status:Full code  Family Communication: None at bedside  Time spent:  25 minutes-Greater than 50% of this time was spent in counseling, explantion of diagnosis, planning of further management, and coordination of care.  Diet: Diet Order             Diet Heart Room service appropriate? Yes; Fluid consistency: Thin  Diet effective now                      Disposition Plan: Status is: Inpatient  Remains inpatient appropriate because:Inpatient level of care appropriate due to severity of illness  Dispo: The patient is from: Home              Anticipated d/c is to: Home              Patient currently is not medically stable to d/c.   Difficult to place patient No    Barriers to Discharge: Severe hypoxemia requiring O2 supplementation-on IV steroids/baricitinib.  Antimicrobial agents: Anti-infectives (From admission, onward)    Start     Dose/Rate Route Frequency Ordered Stop   06/29/21 1000  remdesivir 100 mg in sodium chloride 0.9 % 100 mL IVPB  Status:  Discontinued       See Hyperspace for full Linked Orders Report.   100 mg 200 mL/hr over 30 Minutes Intravenous Daily 06/28/21 0450 06/28/21 1126   06/28/21 1000  remdesivir 200 mg in sodium chloride 0.9% 250 mL IVPB  Status:  Discontinued       See Hyperspace for full Linked Orders Report.   200 mg 580 mL/hr over 30 Minutes Intravenous Once 06/28/21 0450 06/28/21  1126   06/27/21 2330  cefTRIAXone (ROCEPHIN) 2 g in sodium chloride 0.9 % 100 mL IVPB  Status:  Discontinued        2 g 200 mL/hr over 30 Minutes Intravenous Every 24 hours 06/27/21 2213 06/28/21 0450   06/27/21 2330  azithromycin (ZITHROMAX) 500 mg in sodium chloride 0.9 % 250 mL IVPB  Status:  Discontinued        500 mg 250 mL/hr over 60 Minutes Intravenous Daily at bedtime 06/27/21 2213 06/28/21 0450   06/27/21 2330  nirmatrelvir/ritonavir EUA (PAXLOVID) 3 tablet  Status:  Discontinued        3 tablet Oral 2 times daily 06/27/21 2242 06/28/21 0451   06/27/21 0145  nirmatrelvir/ritonavir EUA (PAXLOVID) 3 tablet  Status:  Discontinued        3 tablet Oral 2 times daily 06/27/21 0132 06/27/21 2242   06/27/21 0045  cefTRIAXone (ROCEPHIN) 1 g in sodium chloride 0.9 % 100 mL IVPB        1 g 200 mL/hr over 30 Minutes Intravenous  Once 06/27/21 0030 06/27/21 0138   06/27/21 0045  azithromycin (ZITHROMAX) 500 mg in sodium chloride 0.9 % 250 mL IVPB        500 mg 250 mL/hr over 60 Minutes Intravenous  Once 06/27/21 0030 06/27/21 0215        MEDICATIONS: Scheduled Meds:  albuterol  2 puff Inhalation BID   vitamin C  500 mg Oral Daily   atorvastatin  5 mg Oral Q48H   baricitinib  4 mg Oral Daily   buPROPion  300 mg Oral Daily   DULoxetine  60 mg Oral QHS   enoxaparin (LOVENOX) injection  75 mg Subcutaneous Q12H   famotidine  40 mg Oral QHS   pantoprazole  40 mg Oral q morning   predniSONE  40 mg Oral QAC supper   raloxifene  60 mg Oral Daily   zinc sulfate  220 mg Oral Daily   Continuous Infusions: PRN Meds:.acetaminophen **OR** acetaminophen, albuterol, guaiFENesin-codeine, ondansetron **OR** ondansetron (ZOFRAN) IV, phenol, polyethylene glycol   I have personally reviewed following labs and imaging studies  LABORATORY DATA: CBC: Recent Labs  Lab 06/27/21 2308 06/29/21 0107 06/30/21 0136 07/01/21 0209 07/02/21 0210 07/03/21 0120  WBC 25.7* 27.3* 24.3* 20.9* 17.8* 17.7*   NEUTROABS 20.9* 23.7* 21.4* 16.9* 14.2*  --   HGB 12.4 11.3* 11.5* 11.1* 11.2* 11.6*  HCT 37.3 33.8* 33.7* 32.3* 32.5* 33.5*  MCV 93.0 92.9 91.6 90.2 90.8 90.1  PLT 294 290 283 285 289 307     Basic Metabolic Panel: Recent Labs  Lab 06/27/21 2308 06/29/21 0107 06/30/21 0136 07/01/21 0209 07/02/21 0210 07/03/21 0120  NA 134* 133* 133* 131* 131* 134*  K 3.9 3.9 4.2 4.2 4.3 4.2  CL 104 104 102 99 100 102  CO2 21* 22 22 24 24 23   GLUCOSE 205* 178* 201* 240* 211* 182*  BUN 13 15 17 18 17 17   CREATININE 0.83  0.86 0.75 0.82 0.81 0.73  CALCIUM 9.3 8.8* 8.9 8.9 8.8* 8.8*  MG 2.0 2.0 2.1 2.2 2.2  --      GFR: Estimated Creatinine Clearance: 62.9 mL/min (by C-G formula based on SCr of 0.73 mg/dL).  Liver Function Tests: Recent Labs  Lab 06/27/21 2308 06/29/21 0107 06/30/21 0136 07/01/21 0209 07/02/21 0210  AST 26 24 24 22 24   ALT 28 26 31 31  41  ALKPHOS 57 64 73 67 70  BILITOT 0.6 0.8 0.6 0.5 0.7  PROT 6.3* 5.5* 5.7* 5.5* 5.4*  ALBUMIN 3.0* 2.7* 2.7* 2.6* 2.6*    No results for input(s): LIPASE, AMYLASE in the last 168 hours. No results for input(s): AMMONIA in the last 168 hours.  Coagulation Profile: No results for input(s): INR, PROTIME in the last 168 hours.  Cardiac Enzymes: No results for input(s): CKTOTAL, CKMB, CKMBINDEX, TROPONINI in the last 168 hours.  BNP (last 3 results) No results for input(s): PROBNP in the last 8760 hours.  Lipid Profile: No results for input(s): CHOL, HDL, LDLCALC, TRIG, CHOLHDL, LDLDIRECT in the last 72 hours.  Thyroid Function Tests: No results for input(s): TSH, T4TOTAL, FREET4, T3FREE, THYROIDAB in the last 72 hours.  Anemia Panel: No results for input(s): VITAMINB12, FOLATE, FERRITIN, TIBC, IRON, RETICCTPCT in the last 72 hours.  Urine analysis:    Component Value Date/Time   COLORURINE YELLOW 06/09/2011 1107   APPEARANCEUR CLEAR 06/09/2011 1107   LABSPEC 1.010 06/09/2011 1107   PHURINE 6.5 06/09/2011 1107    GLUCOSEU NEGATIVE 06/09/2011 1107   HGBUR NEGATIVE 06/09/2011 1107   BILIRUBINUR NEGATIVE 06/09/2011 1107   KETONESUR NEGATIVE 06/09/2011 1107   PROTEINUR NEGATIVE 06/09/2011 1107   UROBILINOGEN 0.2 06/09/2011 1107   NITRITE NEGATIVE 06/09/2011 1107   LEUKOCYTESUR TRACE (A) 06/09/2011 1107    Sepsis Labs: Lactic Acid, Venous    Component Value Date/Time   LATICACIDVEN 1.1 06/27/2021 0217    MICROBIOLOGY: Recent Results (from the past 240 hour(s))  Resp Panel by RT-PCR (Flu A&B, Covid) Nasopharyngeal Swab     Status: Abnormal   Collection Time: 06/26/21 11:39 PM   Specimen: Nasopharyngeal Swab; Nasopharyngeal(NP) swabs in vial transport medium  Result Value Ref Range Status   SARS Coronavirus 2 by RT PCR POSITIVE (A) NEGATIVE Final    Comment: RESULT CALLED TO, READ BACK BY AND VERIFIED WITH: CHANIN MAYNARD 06/27/21 AT 0030 HS (NOTE) SARS-CoV-2 target nucleic acids are DETECTED.  The SARS-CoV-2 RNA is generally detectable in upper respiratory specimens during the acute phase of infection. Positive results are indicative of the presence of the identified virus, but do not rule out bacterial infection or co-infection with other pathogens not detected by the test. Clinical correlation with patient history and other diagnostic information is necessary to determine patient infection status. The expected result is Negative.  Fact Sheet for Patients: EntrepreneurPulse.com.au  Fact Sheet for Healthcare Providers: IncredibleEmployment.be  This test is not yet approved or cleared by the [place] FDA and  has been authorized for detection and/or diagnosis of SARS-CoV-2 by FDA under an Emergency Use Authorization (EUA).  This EUA will remain in effect (meaning this test can b e used) for the duration of  the COVID-19 declaration under Section 564(b)(1) of the Act, 21 U.S.C. section 360bbb-3(b)(1), unless the authorization is terminated or  revoked sooner.     Influenza A by PCR NEGATIVE NEGATIVE Final   Influenza B by PCR NEGATIVE NEGATIVE Final    Comment: (NOTE) The Xpert Xpress SARS-CoV-2/FLU/RSV plus  assay is intended as an aid in the diagnosis of influenza from Nasopharyngeal swab specimens and should not be used as a sole basis for treatment. Nasal washings and aspirates are unacceptable for Xpert Xpress SARS-CoV-2/FLU/RSV testing.  Fact Sheet for Patients: EntrepreneurPulse.com.au  Fact Sheet for Healthcare Providers: IncredibleEmployment.be  This test is not yet approved or cleared by the [place] FDA and has been authorized for detection and/or diagnosis of SARS-CoV-2 by FDA under an Emergency Use Authorization (EUA). This EUA will remain in effect (meaning this test can be used) for the duration of the COVID-19 declaration under Section 564(b)(1) of the Act, 21 U.S.C. section 360bbb-3(b)(1), unless the authorization is terminated or revoked.  Performed at [facility], [address] [place], [place] [address]   Blood culture (routine x 2)     Status: None   Collection Time: 06/27/21 12:55 AM   Specimen: BLOOD  Result Value Ref Range Status   Specimen Description   Final    BLOOD Blood Culture adequate volume Performed at [facility], [address] [place], [place] [address]    Special Requests   Final    RIGHT ANTECUBITAL Performed at [facility], [address] [place], [place] [address]    Culture   Final    NO GROWTH 5 DAYS Performed at [facility], [place] [address] [place], [place] [address]    Report Status 07/02/2021 FINAL  Final  Blood culture (routine x 2)     Status: None   Collection Time: 06/27/21 12:55 AM   Specimen: BLOOD  Result Value Ref Range Status   Specimen Description   Final    BLOOD Blood Culture adequate volume Performed at [facility],  [address], [place], [place] [address]    Special Requests   Final    BLOOD RIGHT WRIST Performed at [facility], [address] [place], [place] [address]    Culture   Final    NO GROWTH 5 DAYS Performed at [facility], [place] [address], [place], [place] [address]    Report Status 07/02/2021 FINAL  Final    RADIOLOGY STUDIES/RESULTS: CT Angio Chest Pulmonary Embolism (PE) W or WO Contrast  Result Date: 07/03/2021 CLINICAL DATA:  Shortness of breath.  Positive D-dimer. EXAM: CT ANGIOGRAPHY CHEST WITH CONTRAST TECHNIQUE: Multidetector CT imaging of the chest was performed using the standard protocol during bolus administration of intravenous contrast. Multiplanar CT image reconstructions and MIPs were obtained to evaluate the vascular anatomy. CONTRAST:  57mL OMNIPAQUE IOHEXOL 350 MG/ML SOLN COMPARISON:  06/28/2021 FINDINGS: Cardiovascular: The quality of this exam for evaluation of pulmonary embolism is moderate to good. The bolus is well timed. No evidence of pulmonary embolism. Aortic atherosclerosis. Normal heart size, without pericardial effusion. Mediastinum/Nodes: No middle mediastinal or hilar adenopathy. Pre-vascular 6 mm node is upper normal sized and likely reactive. Lungs/Pleura: No pleural fluid. Relatively diffuse ground-glass opacity is felt to be slightly improved compared to 06/28/2021. Upper Abdomen: Normal imaged portions of the liver, spleen, stomach, pancreas, adrenal glands, left kidney. Incompletely imaged upper pole right renal 4.3 cm low-density lesion is likely a cyst. Musculoskeletal: Thoracic spondylosis. Review of the MIP images confirms the above findings. IMPRESSION: 1.  No evidence of pulmonary embolism. 2. Diffuse ground-glass opacity, felt to be slightly improved. This is consistent with COVID-19 pneumonia. 3.  Aortic Atherosclerosis (ICD10-I70.0). Electronically Signed   By: Abigail Miyamoto M.D.   On: 07/03/2021 11:02   VAS  [place] LOWER  EXTREMITY VENOUS (DVT)  Result Date: 07/03/2021  Lower Venous DVT Study Patient Name:  Tracy Young  Date of Exam:   07/03/2021 Medical Rec #: [id number]       Accession #:    [id number] Date of Birth: [birth year]       Patient Gender: F Patient Age:   61 years Exam Location:  [facility] Procedure:      VAS [place] LOWER EXTREMITY VENOUS (DVT) Referring Phys: Oren Binet --------------------------------------------------------------------------------  Indications: Covid, elevated D-Dimer.  Comparison Study: no prior study on file Performing Technologist: Sharion Dove RVS  Examination Guidelines: A complete evaluation includes B-mode imaging, spectral Doppler, color Doppler, and power Doppler as needed of all accessible portions of each vessel. Bilateral testing is considered an integral part of a complete examination. Limited examinations for reoccurring indications may be performed as noted. The reflux portion of the exam is performed with the patient in reverse Trendelenburg.  +---------+---------------+---------+-----------+----------+--------------+ RIGHT    CompressibilityPhasicitySpontaneityPropertiesThrombus Aging +---------+---------------+---------+-----------+----------+--------------+ CFV      Full           Yes      Yes                                 +---------+---------------+---------+-----------+----------+--------------+ SFJ      Full                                                        +---------+---------------+---------+-----------+----------+--------------+ FV Prox  Full                                                        +---------+---------------+---------+-----------+----------+--------------+ FV Mid   Full                                                        +---------+---------------+---------+-----------+----------+--------------+ FV DistalFull                                                         +---------+---------------+---------+-----------+----------+--------------+ PFV      Full                                                        +---------+---------------+---------+-----------+----------+--------------+ POP      Full           Yes      Yes                                 +---------+---------------+---------+-----------+----------+--------------+ PTV      Full                                                        +---------+---------------+---------+-----------+----------+--------------+  PERO     Full                                                        +---------+---------------+---------+-----------+----------+--------------+   +---------+---------------+---------+-----------+----------+--------------+ LEFT     CompressibilityPhasicitySpontaneityPropertiesThrombus Aging +---------+---------------+---------+-----------+----------+--------------+ CFV      Full           Yes      Yes                                 +---------+---------------+---------+-----------+----------+--------------+ SFJ      Full                                                        +---------+---------------+---------+-----------+----------+--------------+ FV Prox  Full                                                        +---------+---------------+---------+-----------+----------+--------------+ FV Mid   Full                                                        +---------+---------------+---------+-----------+----------+--------------+ FV DistalFull                                                        +---------+---------------+---------+-----------+----------+--------------+ PFV      Full                                                        +---------+---------------+---------+-----------+----------+--------------+ POP      Full           Yes      Yes                                  +---------+---------------+---------+-----------+----------+--------------+ PTV      Full                                                        +---------+---------------+---------+-----------+----------+--------------+ PERO     Full                                                        +---------+---------------+---------+-----------+----------+--------------+  Summary: BILATERAL: - No evidence of deep vein thrombosis seen in the lower extremities, bilaterally. -   *See table(s) above for measurements and observations.    Preliminary      LOS: 6 days   Oren Binet, MD  [facility]    To contact the attending provider between 7A-7P or the covering provider during after hours 7P-7A, please log into the web site [email or web address] and access using universal [place] password for that web site. If you do not have the password, please call the hospital operator.  07/03/2021, 2:01 PM

## 2021-07-03 NOTE — Progress Notes (Signed)
ANTICOAGULATION CONSULT NOTE - Initial Consult  Pharmacy Consult for Lovenox Indication:  suspected VTE  Allergies  Allergen Reactions   Doxycycline Anxiety and Nausea Only    Patient Measurements: Height: 5' 4.5" (163.8 cm) Weight: 75.1 kg (165 lb 9.1 oz) IBW/kg (Calculated) : 55.85   Vital Signs: Temp: 97.8 F (36.6 C) (10/16 0400) Temp Source: Oral (10/16 0400) BP: 132/66 (10/16 0400) Pulse Rate: 85 (10/16 0400)  Labs: Recent Labs    07/01/21 0209 07/02/21 0210 07/03/21 0120  HGB 11.1* 11.2* 11.6*  HCT 32.3* 32.5* 33.5*  PLT 285 289 307  CREATININE 0.82 0.81 0.73    Estimated Creatinine Clearance: 62.9 mL/min (by C-G formula based on SCr of 0.73 mg/dL).   Medical History: Past Medical History:  Diagnosis Date   Chronic kidney disease, stage 3a ([place]) 06/27/2021   GERD without esophagitis 06/27/2021   Mild intermittent asthma without complication 25/63/8937   Mixed hyperlipidemia 06/27/2021    Medications:  Medications Prior to Admission  Medication Sig Dispense Refill Last Dose   albuterol (VENTOLIN HFA) 108 (90 Base) MCG/ACT inhaler Inhale 2 puffs into the lungs every 4 (four) hours as needed for wheezing or shortness of breath.   06/26/2021   Ascorbic Acid (VITAMIN C) 1000 MG tablet Take 1,000 mg by mouth every morning.   06/26/2021 at am   atorvastatin (LIPITOR) 10 MG tablet Take 5 mg by mouth See admin instructions. Take one tablet (10 mg) by mouth every other night   06/26/2021   buPROPion (WELLBUTRIN XL) 300 MG 24 hr tablet Take 300 mg by mouth daily.   06/26/2021 at am   Cholecalciferol (VITAMIN D-3) 125 MCG (5000 UT) TABS Take 5,000 Units by mouth every morning.   06/26/2021 at am   Cyanocobalamin (VITAMIN B-12 PO) Take 1 tablet by mouth every morning.   06/26/2021 at am   DULoxetine (CYMBALTA) 60 MG capsule Take 60 mg by mouth at bedtime.   06/26/2021 at pm   famotidine (PEPCID) 40 MG tablet Take 40 mg by mouth at bedtime.   06/26/2021 at pm   Multiple  Minerals-Vitamins (CALCIUM CITRATE-MAG-MINERALS) TABS Take 1 tablet by mouth every morning. Calcium Citrate plus magnesium and zinc   06/26/2021 at am   [facility] powder Apply 1 application topically daily. Apply to legs after showering   06/26/2021   pantoprazole (PROTONIX) 40 MG tablet Take 40 mg by mouth every morning.   06/26/2021 at am   raloxifene (EVISTA) 60 MG tablet Take 60 mg by mouth daily.   06/26/2021 at am   zolpidem (AMBIEN) 10 MG tablet Take 10 mg by mouth at bedtime.   06/26/2021 at pm   amoxicillin-clavulanate (AUGMENTIN) 875-125 MG tablet Take 1 tablet by mouth 2 (two) times daily. (Patient not taking: No sig reported)   Not Taking   DULoxetine (CYMBALTA) 30 MG capsule Take 30 mg by mouth daily. (Patient not taking: No sig reported)   Not Taking   predniSONE (DELTASONE) 20 MG tablet Take 40 mg by mouth every morning. (Patient not taking: No sig reported)   Not Taking    Assessment: 75 y.o. F presents with COVID. Pt with d-dimer>20. To begin Lovenox for suspected VTE. Pt has been on Lovenxo 40mg  daily (last given 10/15 0800) for VTE prophylaxis. CBC ok.  Goal of Therapy:  Anti-Xa level 0.6-1 units/ml 4hrs after LMWH dose given Monitor platelets by anticoagulation protocol: Yes   Plan:  Lovenox 75mg  SQ q12h Will f/u renal function and CBC q72h while on  Lovenox F/u US lower extremity  Sherlon Handing, PharmD, BCPS Please see amion for complete clinical pharmacist phone list 07/03/2021,4:50 AM

## 2021-07-03 NOTE — Progress Notes (Signed)
Overnight progress note  D-dimer trending up.  It was 1.33 yesterday and now >20 on morning labs.  CTA chest done 10/11 negative for large central PE but study limited (suboptimal opacification of the pulmonary arteries due to bolus timing, limiting evaluation beyond the lobar branches).  Patient currently stable on 2 L supplemental oxygen at rest but required 6 L with ambulation during daytime. -Currently on prophylactic dose Lovenox, changed to therapeutic dose due to high risk of VTE with COVID infection and significant elevation of D-dimer. -Bilateral lower extremity Dopplers ordered to assess for DVT -Consider repeating chest CT angiogram to rule out PE if hypoxia worsens or any signs of respiratory distress

## 2021-07-04 ENCOUNTER — Other Ambulatory Visit (HOSPITAL_COMMUNITY): Payer: Self-pay

## 2021-07-04 LAB — D-DIMER, QUANTITATIVE: D-Dimer, Quant: 1.48 ug/mL-FEU — ABNORMAL HIGH (ref 0.00–0.50)

## 2021-07-04 MED ORDER — PREDNISONE 10 MG PO TABS
ORAL_TABLET | ORAL | 0 refills | Status: DC
  Filled 2021-07-04: qty 19, 7d supply, fill #0

## 2021-07-04 MED ORDER — BENZONATATE 100 MG PO CAPS
100.0000 mg | ORAL_CAPSULE | Freq: Three times a day (TID) | ORAL | 0 refills | Status: AC | PRN
  Filled 2021-07-04: qty 30, 10d supply, fill #0

## 2021-07-04 MED ORDER — BUDESONIDE-FORMOTEROL FUMARATE 80-4.5 MCG/ACT IN AERO
2.0000 | INHALATION_SPRAY | Freq: Two times a day (BID) | RESPIRATORY_TRACT | 12 refills | Status: DC
  Filled 2021-07-04: qty 10.2, 30d supply, fill #0

## 2021-07-04 MED ORDER — ALBUTEROL SULFATE HFA 108 (90 BASE) MCG/ACT IN AERS
2.0000 | INHALATION_SPRAY | RESPIRATORY_TRACT | 1 refills | Status: AC | PRN
  Filled 2021-07-04: qty 8.5, 18d supply, fill #0

## 2021-07-04 MED ORDER — ASPIRIN 325 MG PO TABS
325.0000 mg | ORAL_TABLET | Freq: Every day | ORAL | 0 refills | Status: AC
  Filled 2021-07-04: qty 14, 14d supply, fill #0

## 2021-07-04 NOTE — Discharge Summary (Signed)
PATIENT DETAILS Name: Tracy Young Age: 75 y.o. Sex: female Date of Birth: [birth year] MRN: [id number]. Admitting Physician: Lequita Halt, MD [id number], Herbie [place], MD  Admit Date: 06/26/2021 Discharge date: 07/04/2021  Recommendations for Outpatient Follow-up:  Follow up with PCP in 1-2 weeks Please obtain CMP/CBC in one week Please repeat chest imaging in 4 to 6 weeks to ensure resolution of pneumonia Please reassess at next visit whether patient still requires home O2  Admitted From:  Home  Disposition: Home with home health [place]: Yes  Equipment/Devices: Oxygen-2 L/minute at rest, 5-6 L with ambulation.  Discharge Condition: Stable  CODE STATUS: FULL CODE  Diet recommendation:  Diet Order             Diet - low sodium heart healthy           Diet Heart Room service appropriate? Yes; Fluid consistency: Thin  Diet effective now                    Brief Summary: Patient is a 75 y.o. female with history of HLD, CKD stage IIIa, GERD, asthma-who presented with cough x2 weeks-progressively worsening shortness of breath-she was subsequently found to have acute hypoxic respiratory failure in the setting of COVID-19 pneumonia (spouse with recent COVID-19 infection as well) .  Please see below for further details.  Significant studies: 10/11>> CTA chest: Poor study-no central PE-bilateral confluent groundglass opacities. 10/16>> CTA chest: No PE, diffuse groundglass opacities-felt to be slightly improved. 10/16>> bilateral lower extremity Dopplers: Negative for DVT  Brief Hospital Course: Acute hypoxic respiratory failure due to COVID-19 pneumonia: Had severe hypoxemia-required around 15 L of HFNC when she first presented-treated with steroids/baricitinib and other supportive care.  She gradually improved-for the past few days she has been stable on 2-3 L of oxygen at rest-and requires around 5-6 L of oxygen with ambulation.  She has been very  anxious to go home-given that she has stabilized-suspect she is stable to go home with close outpatient follow-up with PCP at home O2 has been arranged.  Will be discharged on tapering steroids-PCP to reassess at next visit whether she still requires home O2.  Given severity of hypoxemia-and extent of lung infiltrates-I have sent a electronic referral to pulmonology for a posthospital follow-up visit as well.  Elevated D-dimer: Doppler/CTA on 10/16 was negative.  Given the fact that she has COVID-19 infection-and is at risk of VTE-she is agreeable to be on aspirin for approximately 2 weeks.   Leukocytosis: Likely due to steroid use-no indication of concurrent bacterial infection.  Watch closely.   HLD: Continue statin   GERD: Continue Pepcid   Asthma: Not in exacerbation-continue bronchodilators    Procedures None  Discharge Diagnoses:  Principal Problem:   Acute respiratory failure with hypoxia ([place]) Active Problems:   COVID-19 virus infection   Chronic kidney disease, stage 3a (HCC)   GERD without esophagitis   Mixed hyperlipidemia   Mild intermittent asthma with (acute) exacerbation   Discharge Instructions:  Activity:  As tolerated   Discharge Instructions     Ambulatory referral to Pulmonology   Complete by: As directed    Severe COVID-19 infection-going home on oxygen.  Needs follow-up visit in 2-3 weeks.   Reason for referral: Other   Call MD for:  difficulty breathing, headache or visual disturbances   Complete by: As directed    Diet - low sodium heart healthy   Complete by: As directed  Discharge instructions   Complete by: As directed    Follow with Primary MD  Maury Dus, MD in 1-2 weeks  A electronic referral to pulmonology office has been sent-please give them a few days to contact you, if you do not hear from them-please give them a call.  Please get a complete blood count and chemistry panel checked by your Primary MD at your next visit, and again  as instructed by your Primary MD.  Get Medicines reviewed and adjusted: Please take all your medications with you for your next visit with your Primary MD  Laboratory/radiological data: Please request your Primary MD to go over all hospital tests and procedure/radiological results at the follow up, please ask your Primary MD to get all Hospital records sent to his/her office.  In some cases, they will be blood work, cultures and biopsy results pending at the time of your discharge. Please request that your primary care M.D. follows up on these results.  Also Note the following: If you experience worsening of your admission symptoms, develop shortness of breath, life threatening emergency, suicidal or homicidal thoughts you must seek medical attention immediately by calling 911 or calling your MD immediately  if symptoms less severe.  You must read complete instructions/literature along with all the possible adverse reactions/side effects for all the Medicines you take and that have been prescribed to you. Take any new Medicines after you have completely understood and accpet all the possible adverse reactions/side effects.   Do not drive when taking Pain medications or sleeping medications (Benzodaizepines)  Do not take more than prescribed Pain, Sleep and Anxiety Medications. It is not advisable to combine anxiety,sleep and pain medications without talking with your primary care practitioner  Special Instructions: If you have smoked or chewed Tobacco  in the last 2 yrs please stop smoking, stop any regular Alcohol  and or any Recreational drug use.  Wear Seat belts while driving.  Please note: You were cared for by a hospitalist during your hospital stay. Once you are discharged, your primary care physician will handle any further medical issues. Please note that NO REFILLS for any discharge medications will be authorized once you are discharged, as it is imperative that you return to your  primary care physician (or establish a relationship with a primary care physician if you do not have one) for your post hospital discharge needs so that they can reassess your need for medications and monitor your lab values.   1.)  21 days of isolation from 10/9  2.)  If you develop worsening shortness of breath-please seek immediate medical attention  3.)  Please ask your primary care doctor at your next follow-up whether you still requires home O2 or not.  4.)  Please repeat a two-view chest x-ray or a CT scan chest in 4 to 6 weeks time to ensure resolution of pneumonia.   Increase activity slowly   Complete by: As directed       Allergies as of 07/04/2021       Reactions   Doxycycline Anxiety, Nausea Only        Medication List     STOP taking these medications    amoxicillin-clavulanate 875-125 MG tablet Commonly known as: AUGMENTIN       TAKE these medications    albuterol 108 (90 Base) MCG/ACT inhaler Commonly known as: VENTOLIN HFA Inhale 2 puffs into the lungs every 4 (four) hours as needed for wheezing or shortness of breath.   aspirin  325 MG tablet Commonly known as: Bayer Aspirin Take 1 tablet (325 mg total) by mouth daily for 14 days.   atorvastatin 10 MG tablet Commonly known as: LIPITOR Take 5 mg by mouth See admin instructions. Take one tablet (10 mg) by mouth every other night   benzonatate 100 MG capsule Commonly known as: Tessalon Perles Take 1 capsule (100 mg total) by mouth 3 (three) times daily as needed for cough.   budesonide-formoterol 80-4.5 MCG/ACT inhaler Commonly known as: Symbicort Inhale 2 puffs into the lungs 2 (two) times daily.   buPROPion 300 MG 24 hr tablet Commonly known as: WELLBUTRIN XL Take 300 mg by mouth daily.   Calcium Citrate-Mag-Minerals Tabs Take 1 tablet by mouth every morning. Calcium Citrate plus magnesium and zinc   DULoxetine 60 MG capsule Commonly known as: CYMBALTA Take 60 mg by mouth at bedtime.    DULoxetine 30 MG capsule Commonly known as: CYMBALTA Take 30 mg by mouth daily.   famotidine 40 MG tablet Commonly known as: PEPCID Take 40 mg by mouth at bedtime.   Nyamyc powder Generic drug: nystatin Apply 1 application topically daily. Apply to legs after showering   pantoprazole 40 MG tablet Commonly known as: PROTONIX Take 40 mg by mouth every morning.   predniSONE 10 MG tablet Commonly known as: DELTASONE Take 40 mg daily for 2 days, 30 mg daily for 2 days, 20 mg daily for 2 days,10 mg daily for 1 days, then stop What changed:  medication strength how much to take how to take this when to take this additional instructions   raloxifene 60 MG tablet Commonly known as: EVISTA Take 60 mg by mouth daily.   VITAMIN B-12 PO Take 1 tablet by mouth every morning.   vitamin C 1000 MG tablet Take 1,000 mg by mouth every morning.   Vitamin D-3 125 MCG (5000 UT) Tabs Take 5,000 Units by mouth every morning.   zolpidem 10 MG tablet Commonly known as: AMBIEN Take 10 mg by mouth at bedtime.               Durable Medical Equipment  (From admission, onward)           Start     Ordered   07/02/21 1352  For home use only DME oxygen  Once       Comments: 2-3 L at rest 6L with ambulation  Question Answer Comment  Length of Need 6 Months   Mode or (Route) Nasal cannula   Liters per Minute 3   Oxygen delivery system Gas      07/02/21 1351   06/29/21 1336  For home use only DME 4 wheeled rolling walker with seat  Once       Question:  Patient needs a walker to treat with the following condition  Answer:  Weakness   06/29/21 1338            Follow-up Information     [facility] Follow up.   Specialty: Home Health Services Why: They will call to schedule an appt. Please allow 48 hours after discharge to receive call. Contact information: [place] [address] [phone]         Maury Dus, MD.  Schedule an appointment as soon as possible for a visit in 1 week(s).   Specialty: Family Medicine Contact information: [place] [address] [phone]         Marshell Garfinkel,  MD Follow up.   Specialty: Pulmonary Disease Why: Office will call with date/time, If you dont hear from them,please give them a call, Hospital follow up Contact information: [address] [place] [address] [phone]                Allergies  Allergen Reactions   Doxycycline Anxiety and Nausea Only      Consultations:  None   Other Procedures/Studies: CT Angio Chest Pulmonary Embolism (PE) W or WO Contrast  Result Date: 07/03/2021 CLINICAL DATA:  Shortness of breath.  Positive D-dimer. EXAM: CT ANGIOGRAPHY CHEST WITH CONTRAST TECHNIQUE: Multidetector CT imaging of the chest was performed using the standard protocol during bolus administration of intravenous contrast. Multiplanar CT image reconstructions and MIPs were obtained to evaluate the vascular anatomy. CONTRAST:  73mL OMNIPAQUE IOHEXOL 350 MG/ML SOLN COMPARISON:  06/28/2021 FINDINGS: Cardiovascular: The quality of this exam for evaluation of pulmonary embolism is moderate to good. The bolus is well timed. No evidence of pulmonary embolism. Aortic atherosclerosis. Normal heart size, without pericardial effusion. Mediastinum/Nodes: No middle mediastinal or hilar adenopathy. Pre-vascular 6 mm node is upper normal sized and likely reactive. Lungs/Pleura: No pleural fluid. Relatively diffuse ground-glass opacity is felt to be slightly improved compared to 06/28/2021. Upper Abdomen: Normal imaged portions of the liver, spleen, stomach, pancreas, adrenal glands, left kidney. Incompletely imaged upper pole right renal 4.3 cm low-density lesion is likely a cyst. Musculoskeletal: Thoracic spondylosis. Review of the MIP images confirms the above findings. IMPRESSION: 1.  No evidence of pulmonary embolism.  2. Diffuse ground-glass opacity, felt to be slightly improved. This is consistent with COVID-19 pneumonia. 3.  Aortic Atherosclerosis (ICD10-I70.0). Electronically Signed   By: Abigail Miyamoto M.D.   On: 07/03/2021 11:02   CT Angio Chest Pulmonary Embolism (PE) W or WO Contrast  Result Date: 06/28/2021 CLINICAL DATA:  Positive D-dimer.  Shortness of breath and EXAM: CT ANGIOGRAPHY CHEST WITH CONTRAST TECHNIQUE: Multidetector CT imaging of the chest was performed using the standard protocol during bolus administration of intravenous contrast. Multiplanar CT image reconstructions and MIPs were obtained to evaluate the vascular anatomy. CONTRAST:  172mL OMNIPAQUE IOHEXOL 350 MG/ML SOLN COMPARISON:  None. FINDINGS: Cardiovascular: Suboptimal opacification of the pulmonary arteries due to bolus timing, limiting evaluation beyond the lobar branches. Within that limitation, there is no central pulmonary embolus. The size of the main pulmonary artery is normal. Heart size is normal, with no pericardial effusion. The course and caliber of the aorta are normal. There is atherosclerotic calcification. No acute aortic syndrome. Mediastinum/Nodes: No mediastinal, hilar or axillary lymphadenopathy. Normal visualized thyroid. Thoracic esophageal course is normal. Lungs/Pleura: Bilateral confluent ground-glass opacities, likely pulmonary edema. No pleural effusion. Upper Abdomen: Contrast bolus timing is not optimized for evaluation of the abdominal organs. The visualized portions of the organs of the upper abdomen are normal. Musculoskeletal: No chest wall abnormality. No bony spinal canal stenosis. Review of the MIP images confirms the above findings. IMPRESSION: 1. Suboptimal opacification of the pulmonary arteries due to bolus timing, limiting evaluation beyond the lobar branches. Within that limitation, there is no central pulmonary embolus. 2. Bilateral confluent ground-glass opacities, likely pulmonary edema. Aortic  atherosclerosis (ICD10-I70.0). Electronically Signed   By: Ulyses Jarred M.D.   On: 06/28/2021 02:32   DG Chest Portable 1 View  Result Date: 06/27/2021 CLINICAL DATA:  Cough EXAM: PORTABLE CHEST 1 VIEW COMPARISON:  None. FINDINGS: There is increased opacity at the left lung base. Cardiomediastinal contours are normal. No  pneumothorax or sizable pleural effusion. IMPRESSION: Increased opacity at the left lung base may indicate developing infection. Electronically Signed   By: Ulyses Jarred M.D.   On: 06/27/2021 00:00   VAS [place] LOWER EXTREMITY VENOUS (DVT)  Result Date: 07/03/2021  Lower Venous DVT Study Patient Name:  MAKENNA MACALUSO  Date of Exam:   07/03/2021 Medical Rec #: [id number]       Accession #:    [id number] Date of Birth: 1945/11/25       Patient Gender: F Patient Age:   54 years Exam [facility] Procedure:      VAS [place] LOWER EXTREMITY VENOUS (DVT) Referring Phys: Oren Binet --------------------------------------------------------------------------------  Indications: Covid, elevated D-Dimer.  Comparison Study: no prior study on file Performing Technologist: Sharion Dove RVS  Examination Guidelines: A complete evaluation includes B-mode imaging, spectral Doppler, color Doppler, and power Doppler as needed of all accessible portions of each vessel. Bilateral testing is considered an integral part of a complete examination. Limited examinations for reoccurring indications may be performed as noted. The reflux portion of the exam is performed with the patient in reverse Trendelenburg.  +---------+---------------+---------+-----------+----------+--------------+ RIGHT    CompressibilityPhasicitySpontaneityPropertiesThrombus Aging +---------+---------------+---------+-----------+----------+--------------+ CFV      Full           Yes      Yes                                 +---------+---------------+---------+-----------+----------+--------------+ SFJ      Full                                                         +---------+---------------+---------+-----------+----------+--------------+ FV Prox  Full                                                        +---------+---------------+---------+-----------+----------+--------------+ FV Mid   Full                                                        +---------+---------------+---------+-----------+----------+--------------+ FV DistalFull                                                        +---------+---------------+---------+-----------+----------+--------------+ PFV      Full                                                        +---------+---------------+---------+-----------+----------+--------------+ POP      Full           Yes      Yes                                 +---------+---------------+---------+-----------+----------+--------------+  PTV      Full                                                        +---------+---------------+---------+-----------+----------+--------------+ PERO     Full                                                        +---------+---------------+---------+-----------+----------+--------------+   +---------+---------------+---------+-----------+----------+--------------+ LEFT     CompressibilityPhasicitySpontaneityPropertiesThrombus Aging +---------+---------------+---------+-----------+----------+--------------+ CFV      Full           Yes      Yes                                 +---------+---------------+---------+-----------+----------+--------------+ SFJ      Full                                                        +---------+---------------+---------+-----------+----------+--------------+ FV Prox  Full                                                        +---------+---------------+---------+-----------+----------+--------------+ FV Mid   Full                                                         +---------+---------------+---------+-----------+----------+--------------+ FV DistalFull                                                        +---------+---------------+---------+-----------+----------+--------------+ PFV      Full                                                        +---------+---------------+---------+-----------+----------+--------------+ POP      Full           Yes      Yes                                 +---------+---------------+---------+-----------+----------+--------------+ PTV      Full                                                        +---------+---------------+---------+-----------+----------+--------------+  PERO     Full                                                        +---------+---------------+---------+-----------+----------+--------------+     Summary: BILATERAL: - No evidence of deep vein thrombosis seen in the lower extremities, bilaterally. -   *See table(s) above for measurements and observations. Electronically signed by Monica Martinez MD on 07/03/2021 at 3:43:52 PM.    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   Objective:   Blood pressure (!) 145/79, pulse 91, temperature 99 F (37.2 C), temperature source Oral, resp. rate 16, height 5' 4.5" (1.638 m), weight 75.1 kg, SpO2 99 %. No intake or output data in the 24 hours ending 07/04/21 1059 Filed Weights   06/26/21 2329 06/27/21 1939 07/02/21 0639  Weight: 75.8 kg 74.6 kg 75.1 kg    Exam: Awake Alert, Oriented *3, No new F.N deficits, Normal affect [place].AT,PERRAL Supple Neck,No JVD, No cervical lymphadenopathy appriciated.  Symmetrical Chest wall movement, Good air movement bilaterally, CTAB RRR,No Gallops,Rubs or new Murmurs, No Parasternal Heave +ve B.Sounds, Abd Soft, Non tender, No  organomegaly appriciated, No rebound -guarding or rigidity. No Cyanosis, Clubbing or edema, No new Rash or bruise   PERTINENT RADIOLOGIC STUDIES: CT Angio Chest Pulmonary Embolism (PE) W or WO Contrast  Result Date: 07/03/2021 CLINICAL DATA:  Shortness of breath.  Positive D-dimer. EXAM: CT ANGIOGRAPHY CHEST WITH CONTRAST TECHNIQUE: Multidetector CT imaging of the chest was performed using the standard protocol during bolus administration of intravenous contrast. Multiplanar CT image reconstructions and MIPs were obtained to evaluate the vascular anatomy. CONTRAST:  23mL OMNIPAQUE IOHEXOL 350 MG/ML SOLN COMPARISON:  06/28/2021 FINDINGS: Cardiovascular: The quality of this exam for evaluation of pulmonary embolism is moderate to good. The bolus is well timed. No evidence of pulmonary embolism. Aortic atherosclerosis. Normal heart size, without pericardial effusion. Mediastinum/Nodes: No middle mediastinal or hilar adenopathy. Pre-vascular 6 mm node is upper normal sized and likely reactive. Lungs/Pleura: No pleural fluid. Relatively diffuse ground-glass opacity is felt to be slightly improved compared to 06/28/2021. Upper Abdomen: Normal imaged portions of the liver, spleen, stomach, pancreas, adrenal glands, left kidney. Incompletely imaged upper pole right renal 4.3 cm low-density lesion is likely a cyst. Musculoskeletal: Thoracic spondylosis. Review of the MIP images confirms the above findings. IMPRESSION: 1.  No evidence of pulmonary embolism. 2. Diffuse ground-glass opacity, felt to be slightly improved. This is consistent with COVID-19 pneumonia. 3.  Aortic Atherosclerosis (ICD10-I70.0). Electronically Signed   By: Abigail Miyamoto M.D.   On: 07/03/2021 11:02   VAS [place] LOWER EXTREMITY VENOUS (DVT)  Result Date: 07/03/2021  Lower Venous DVT Study Patient Name:  MITZIE MARLAR  Date of Exam:   07/03/2021 Medical Rec #: [id number]       Accession #:    [id number] Date of Birth: 29-Dec-1945       Patient  Gender: F Patient Age:   10 years Exam Location:  [facility] Procedure:      VAS [place] LOWER EXTREMITY VENOUS (DVT) Referring Phys: Oren Binet --------------------------------------------------------------------------------  Indications: Covid, elevated D-Dimer.  Comparison Study: no prior study on file Performing  Technologist: Sharion Dove RVS  Examination Guidelines: A complete evaluation includes B-mode imaging, spectral Doppler, color Doppler, and power Doppler as needed of all accessible portions of each vessel. Bilateral testing is considered an integral part of a complete examination. Limited examinations for reoccurring indications may be performed as noted. The reflux portion of the exam is performed with the patient in reverse Trendelenburg.  +---------+---------------+---------+-----------+----------+--------------+ RIGHT    CompressibilityPhasicitySpontaneityPropertiesThrombus Aging +---------+---------------+---------+-----------+----------+--------------+ CFV      Full           Yes      Yes                                 +---------+---------------+---------+-----------+----------+--------------+ SFJ      Full                                                        +---------+---------------+---------+-----------+----------+--------------+ FV Prox  Full                                                        +---------+---------------+---------+-----------+----------+--------------+ FV Mid   Full                                                        +---------+---------------+---------+-----------+----------+--------------+ FV DistalFull                                                        +---------+---------------+---------+-----------+----------+--------------+ PFV      Full                                                        +---------+---------------+---------+-----------+----------+--------------+ POP      Full           Yes       Yes                                 +---------+---------------+---------+-----------+----------+--------------+ PTV      Full                                                        +---------+---------------+---------+-----------+----------+--------------+ PERO     Full                                                        +---------+---------------+---------+-----------+----------+--------------+   +---------+---------------+---------+-----------+----------+--------------+  LEFT     CompressibilityPhasicitySpontaneityPropertiesThrombus Aging +---------+---------------+---------+-----------+----------+--------------+ CFV      Full           Yes      Yes                                 +---------+---------------+---------+-----------+----------+--------------+ SFJ      Full                                                        +---------+---------------+---------+-----------+----------+--------------+ FV Prox  Full                                                        +---------+---------------+---------+-----------+----------+--------------+ FV Mid   Full                                                        +---------+---------------+---------+-----------+----------+--------------+ FV DistalFull                                                        +---------+---------------+---------+-----------+----------+--------------+ PFV      Full                                                        +---------+---------------+---------+-----------+----------+--------------+ POP      Full           Yes      Yes                                 +---------+---------------+---------+-----------+----------+--------------+ PTV      Full                                                        +---------+---------------+---------+-----------+----------+--------------+ PERO     Full                                                         +---------+---------------+---------+-----------+----------+--------------+     Summary: BILATERAL: - No evidence of deep vein thrombosis seen in the lower extremities, bilaterally. -   *See table(s) above for measurements and observations. Electronically signed by Monica Martinez MD on 07/03/2021 at 3:43:52 PM.    Final  PERTINENT LAB RESULTS: CBC: Recent Labs    07/02/21 0210 07/03/21 0120  WBC 17.8* 17.7*  HGB 11.2* 11.6*  HCT 32.5* 33.5*  PLT 289 307   CMET CMP     Component Value Date/Time   NA 134 (L) 07/03/2021 0120   K 4.2 07/03/2021 0120   CL 102 07/03/2021 0120   CO2 23 07/03/2021 0120   GLUCOSE 182 (H) 07/03/2021 0120   BUN 17 07/03/2021 0120   CREATININE 0.73 07/03/2021 0120   CALCIUM 8.8 (L) 07/03/2021 0120   PROT 5.4 (L) 07/02/2021 0210   ALBUMIN 2.6 (L) 07/02/2021 0210   AST 24 07/02/2021 0210   ALT 41 07/02/2021 0210   ALKPHOS 70 07/02/2021 0210   BILITOT 0.7 07/02/2021 0210   GFRNONAA >60 07/03/2021 0120   GFRAA >60 06/09/2011 0545    GFR Estimated Creatinine Clearance: 62.9 mL/min (by C-G formula based on SCr of 0.73 mg/dL). No results for input(s): LIPASE, AMYLASE in the last 72 hours. No results for input(s): CKTOTAL, CKMB, CKMBINDEX, TROPONINI in the last 72 hours. Invalid input(s): POCBNP Recent Labs    07/03/21 0120 07/04/21 0054  DDIMER >20.00* 1.48*   No results for input(s): HGBA1C in the last 72 hours. No results for input(s): CHOL, HDL, LDLCALC, TRIG, CHOLHDL, LDLDIRECT in the last 72 hours. No results for input(s): TSH, T4TOTAL, T3FREE, THYROIDAB in the last 72 hours.  Invalid input(s): FREET3 No results for input(s): VITAMINB12, FOLATE, FERRITIN, TIBC, IRON, RETICCTPCT in the last 72 hours. Coags: No results for input(s): INR in the last 72 hours.  Invalid input(s): PT Microbiology: Recent Results (from the past 240 hour(s))  Resp Panel by RT-PCR (Flu A&B, Covid) Nasopharyngeal Swab     Status: Abnormal   Collection Time:  06/26/21 11:39 PM   Specimen: Nasopharyngeal Swab; Nasopharyngeal(NP) swabs in vial transport medium  Result Value Ref Range Status   SARS Coronavirus 2 by RT PCR POSITIVE (A) NEGATIVE Final    Comment: RESULT CALLED TO, READ BACK BY AND VERIFIED WITH: CHANIN MAYNARD 06/27/21 AT 0030 HS (NOTE) SARS-CoV-2 target nucleic acids are DETECTED.  The SARS-CoV-2 RNA is generally detectable in upper respiratory specimens during the acute phase of infection. Positive results are indicative of the presence of the identified virus, but do not rule out bacterial infection or co-infection with other pathogens not detected by the test. Clinical correlation with patient history and other diagnostic information is necessary to determine patient infection status. The expected result is Negative.  Fact Sheet for Patients: EntrepreneurPulse.com.au  Fact Sheet for Healthcare Providers: IncredibleEmployment.be  This test is not yet approved or cleared by the [place] FDA and  has been authorized for detection and/or diagnosis of SARS-CoV-2 by FDA under an Emergency Use Authorization (EUA).  This EUA will remain in effect (meaning this test can b e used) for the duration of  the COVID-19 declaration under Section 564(b)(1) of the Act, 21 U.S.C. section 360bbb-3(b)(1), unless the authorization is terminated or revoked sooner.     Influenza A by PCR NEGATIVE NEGATIVE Final   Influenza B by PCR NEGATIVE NEGATIVE Final    Comment: (NOTE) The Xpert Xpress SARS-CoV-2/FLU/RSV plus assay is intended as an aid in the diagnosis of influenza from Nasopharyngeal swab specimens and should not be used as a sole basis for treatment. Nasal washings and aspirates are unacceptable for Xpert Xpress SARS-CoV-2/FLU/RSV testing.  Fact Sheet for Patients: EntrepreneurPulse.com.au  Fact Sheet for Healthcare  Providers: IncredibleEmployment.be  This test is not yet approved  or cleared by the [place] and has been authorized for detection and/or diagnosis of SARS-CoV-2 by FDA under an Emergency Use Authorization (EUA). This EUA will remain in effect (meaning this test can be used) for the duration of the COVID-19 declaration under Section 564(b)(1) of the Act, 21 U.S.C. section 360bbb-3(b)(1), unless the authorization is terminated or revoked.  Performed at [facility], [address] [place], [place] [address]   Blood culture (routine x 2)     Status: None   Collection Time: 06/27/21 12:55 AM   Specimen: BLOOD  Result Value Ref Range Status   Specimen Description   Final    BLOOD Blood Culture adequate volume Performed at [facility], [address] [place], [place] [address]    Special Requests   Final    RIGHT ANTECUBITAL Performed at [facility], [address] [place], [place] [address]    Culture   Final    NO GROWTH 5 DAYS Performed at [facility], [place] [address], [place], [place] [address]    Report Status 07/02/2021 FINAL  Final  Blood culture (routine x 2)     Status: None   Collection Time: 06/27/21 12:55 AM   Specimen: BLOOD  Result Value Ref Range Status   Specimen Description   Final    BLOOD Blood Culture adequate volume Performed at [facility], [address], [place], [place] [address]    Special Requests   Final    BLOOD RIGHT WRIST Performed at [facility], [address], [place], [place] [address]    Culture   Final    NO GROWTH 5 DAYS Performed at [facility], [place] [address] [place],  [address]    Report Status 07/02/2021 FINAL  Final    FURTHER DISCHARGE INSTRUCTIONS:  Get Medicines reviewed and adjusted: Please take all your medications with you for your next visit with your Primary  MD  Laboratory/radiological data: Please request your Primary MD to go over all hospital tests and procedure/radiological results at the follow up, please ask your Primary MD to get all Hospital records sent to his/her office.  In some cases, they will be blood work, cultures and biopsy results pending at the time of your discharge. Please request that your primary care M.D. goes through all the records of your hospital data and follows up on these results.  Also Note the following: If you experience worsening of your admission symptoms, develop shortness of breath, life threatening emergency, suicidal or homicidal thoughts you must seek medical attention immediately by calling 911 or calling your MD immediately  if symptoms less severe.  You must read complete instructions/literature along with all the possible adverse reactions/side effects for all the Medicines you take and that have been prescribed to you. Take any new Medicines after you have completely understood and accpet all the possible adverse reactions/side effects.   Do not drive when taking Pain medications or sleeping medications (Benzodaizepines)  Do not take more than prescribed Pain, Sleep and Anxiety Medications. It is not advisable to combine anxiety,sleep and pain medications without talking with your primary care practitioner  Special Instructions: If you have smoked or chewed Tobacco  in the last 2 yrs please stop smoking, stop any regular Alcohol  and or any Recreational drug use.  Wear Seat belts while driving.  Please note: You were cared for by a hospitalist during your hospital stay. Once you are discharged, your primary  care physician will handle any further medical issues. Please note that NO REFILLS for any discharge medications will be authorized once you are discharged, as it is imperative that you return to your primary care physician (or establish a relationship with a primary care physician if you do not have  one) for your post hospital discharge needs so that they can reassess your need for medications and monitor your lab values.  Total Time spent coordinating discharge including counseling, education and face to face time equals 35 minutes.  SignedOren Binet 07/04/2021 10:59 AM

## 2021-07-04 NOTE — Plan of Care (Signed)
  Problem: Education: Goal: Knowledge of General Education information will improve Description Including pain rating scale, medication(s)/side effects and non-pharmacologic comfort measures Outcome: Progressing   Problem: Health Behavior/Discharge Planning: Goal: Ability to manage health-related needs will improve Outcome: Progressing   

## 2021-07-04 NOTE — Discharge Instructions (Signed)
Person Under Monitoring Name: Tracy Young  Location: [place] [address]   Infection Prevention Recommendations for Individuals Confirmed to have, or Being Evaluated for, 2019 Novel Coronavirus (COVID-19) Infection Who Receive Care at Home  Individuals who are confirmed to have, or are being evaluated for, COVID-19 should follow the prevention steps below until a healthcare provider or local or state health department says they can return to normal activities.  Stay home except to get medical care You should restrict activities outside your home, except for getting medical care. Do not go to work, school, or public areas, and do not use public transportation or taxis.  Call ahead before visiting your doctor Before your medical appointment, call the healthcare provider and tell them that you have, or are being evaluated for, COVID-19 infection. This will help the healthcare provider's office take steps to keep other people from getting infected. Ask your healthcare provider to call the local or state health department.  Monitor your symptoms Seek prompt medical attention if your illness is worsening (e.g., difficulty breathing). Before going to your medical appointment, call the healthcare provider and tell them that you have, or are being evaluated for, COVID-19 infection. Ask your healthcare provider to call the local or state health department.  Wear a facemask You should wear a facemask that covers your nose and mouth when you are in the same room with other people and when you visit a healthcare provider. People who live with or visit you should also wear a facemask while they are in the same room with you.  Separate yourself from other people in your home As much as possible, you should stay in a different room from other people in your home. Also, you should use a separate bathroom, if available.  Avoid sharing household items You should not  share dishes, drinking glasses, cups, eating utensils, towels, bedding, or other items with other people in your home. After using these items, you should wash them thoroughly with soap and water.  Cover your coughs and sneezes Cover your mouth and nose with a tissue when you cough or sneeze, or you can cough or sneeze into your sleeve. Throw used tissues in a lined trash can, and immediately wash your hands with soap and water for at least 20 seconds or use an alcohol-based hand rub.  Wash your [facility] your hands often and thoroughly with soap and water for at least 20 seconds. You can use an alcohol-based hand sanitizer if soap and water are not available and if your hands are not visibly dirty. Avoid touching your eyes, nose, and mouth with unwashed hands.   Prevention Steps for Caregivers and Household Members of Individuals Confirmed to have, or Being Evaluated for, COVID-19 Infection Being Cared for in the Home  If you live with, or provide care at home for, a person confirmed to have, or being evaluated for, COVID-19 infection please follow these guidelines to prevent infection:  Follow healthcare provider's instructions Make sure that you understand and can help the patient follow any healthcare provider instructions for all care.  Provide for the patient's basic needs You should help the patient with basic needs in the home and provide support for getting groceries, prescriptions, and other personal needs.  Monitor the patient's symptoms If they are getting sicker, call his or her medical provider and tell them that the patient has, or is being evaluated for, COVID-19 infection. This will help the healthcare provider's  office take steps to keep other people from getting infected. Ask the healthcare provider to call the local or state health department.  Limit the number of people who have contact with the patient If possible, have only one caregiver for the  patient. Other household members should stay in another home or place of residence. If this is not possible, they should stay in another room, or be separated from the patient as much as possible. Use a separate bathroom, if available. Restrict visitors who do not have an essential need to be in the home.  Keep older adults, very young children, and other sick people away from the patient Keep older adults, very young children, and those who have compromised immune systems or chronic health conditions away from the patient. This includes people with chronic heart, lung, or kidney conditions, diabetes, and cancer.  Ensure good ventilation Make sure that shared spaces in the home have good air flow, such as from an air conditioner or an opened window, weather permitting.  Wash your hands often Wash your hands often and thoroughly with soap and water for at least 20 seconds. You can use an alcohol based hand sanitizer if soap and water are not available and if your hands are not visibly dirty. Avoid touching your eyes, nose, and mouth with unwashed hands. Use disposable paper towels to dry your hands. If not available, use dedicated cloth towels and replace them when they become wet.  Wear a facemask and gloves Wear a disposable facemask at all times in the room and gloves when you touch or have contact with the patient's blood, body fluids, and/or secretions or excretions, such as sweat, saliva, sputum, nasal mucus, vomit, urine, or feces.  Ensure the mask fits over your nose and mouth tightly, and do not touch it during use. Throw out disposable facemasks and gloves after using them. Do not reuse. Wash your hands immediately after removing your facemask and gloves. If your personal clothing becomes contaminated, carefully remove clothing and launder. Wash your hands after handling contaminated clothing. Place all used disposable facemasks, gloves, and other waste in a lined container before  disposing them with other household waste. Remove gloves and wash your hands immediately after handling these items.  Do not share dishes, glasses, or other household items with the patient Avoid sharing household items. You should not share dishes, drinking glasses, cups, eating utensils, towels, bedding, or other items with a patient who is confirmed to have, or being evaluated for, COVID-19 infection. After the person uses these items, you should wash them thoroughly with soap and water.  Wash laundry thoroughly Immediately remove and wash clothes or bedding that have blood, body fluids, and/or secretions or excretions, such as sweat, saliva, sputum, nasal mucus, vomit, urine, or feces, on them. Wear gloves when handling laundry from the patient. Read and follow directions on labels of laundry or clothing items and detergent. In general, wash and dry with the warmest temperatures recommended on the label.  Clean all areas the individual has used often Clean all touchable surfaces, such as counters, tabletops, doorknobs, bathroom fixtures, toilets, phones, keyboards, tablets, and bedside tables, every day. Also, clean any surfaces that may have blood, body fluids, and/or secretions or excretions on them. Wear gloves when cleaning surfaces the patient has come in contact with. Use a diluted bleach solution (e.g., dilute bleach with 1 part bleach and 10 parts water) or a household disinfectant with a label that says EPA-registered for coronaviruses. To make a  bleach solution at home, add 1 tablespoon of bleach to 1 quart (4 cups) of water. For a larger supply, add  cup of bleach to 1 gallon (16 cups) of water. Read labels of cleaning products and follow recommendations provided on product labels. Labels contain instructions for safe and effective use of the cleaning product including precautions you should take when applying the product, such as wearing gloves or eye protection and making sure you  have good ventilation during use of the product. Remove gloves and wash hands immediately after cleaning.  Monitor yourself for signs and symptoms of illness Caregivers and household members are considered close contacts, should monitor their health, and will be asked to limit movement outside of the home to the extent possible. Follow the monitoring steps for close contacts listed on the symptom monitoring form.   ? If you have additional questions, contact your local health department or call the epidemiologist on call at [phone] (available 24/7). ? This guidance is subject to change. For the most up-to-date guidance from [facility], please refer to their website: [email or web address]

## 2021-07-04 NOTE — Progress Notes (Signed)
Physical Therapy Treatment Patient Details Name: Tracy Young MRN: [id number] DOB: 07/12/1946 Today's Date: 07/04/2021   History of Present Illness 75yo female who presented on 10/9 with progressive SOB, cough, and weakness. Found to be hypoxic to 84% on room air and tested positive for Covid. Transferred from [facility] to [facility] for ongoing care. PMH CKD, HDL, TKR, asthma    PT Comments    Session focused on reviewing sitting and standing HEP prior to discharge home (see below for exercises). SpO2 82-90% on 3L O2, HR 110-124, RR up to 34. Pt with very good self awareness and is able to self cue for activity pacing and rest breaks. Education reinforced regarding energy conservation techniques and progression of activity as tolerated. D/c plan remains appropriate.     Recommendations for follow up therapy are one component of a multi-disciplinary discharge planning process, led by the attending physician.  Recommendations may be updated based on patient status, additional functional criteria and insurance authorization.  Follow Up Recommendations  Home health PT     Equipment Recommendations  Other (comment) (shower seat and rollator)    Recommendations for Other Services       Precautions / Restrictions Precautions Precautions: Fall Precaution Comments: Covid +, watch sats and HR Restrictions Weight Bearing Restrictions: No     Mobility  Bed Mobility Overal bed mobility: Modified Independent             General bed mobility comments: Pt able to transition supine > sit with HOB elevated without assistance.    Transfers Overall transfer level: Modified independent Equipment used: None                Ambulation/Gait Ambulation/Gait assistance: Supervision Gait Distance (Feet): 3 Feet Assistive device: None Gait Pattern/deviations: Step-through pattern;Decreased stride length Gait velocity: reduced   General Gait Details: Took a few steps from bed to sink  unsupported   Marine scientist [place] (Stroke Patients Only)       Balance Overall balance assessment: Mild deficits observed, not formally tested                                          Cognition Arousal/Alertness: Awake/alert Behavior During Therapy: WFL for tasks assessed/performed;Anxious Overall Cognitive Status: Within Functional Limits for tasks assessed                                        Exercises General Exercises - Lower Extremity Long Arc Quad: Both;10 reps;Seated Hip Flexion/Marching: Both;10 reps;Seated Other Exercises Other Exercises: Sit to stands x 10 with no UE support Other Exercises: Standing: calf raises x 10, bilateral hamstring curls x 10    General Comments        Pertinent Vitals/Pain Pain Assessment: Faces Faces Pain Scale: Hurts a little bit Pain Location: chest when coughing/congestion Pain Descriptors / Indicators: Discomfort;Grimacing Pain Intervention(s): Monitored during session    Home Living                      Prior Function            PT Goals (current goals can now be found in the care plan section) Acute Rehab PT  Goals Patient Stated Goal: to improve PT Goal Formulation: With patient/family Time For Goal Achievement: 07/12/21 Potential to Achieve Goals: Fair Progress towards PT goals: Progressing toward goals    Frequency    Min 3X/week      PT Plan Current plan remains appropriate    Co-evaluation              AM-PAC PT "6 Clicks" Mobility   Outcome Measure  Help needed turning from your back to your side while in a flat bed without using bedrails?: None Help needed moving from lying on your back to sitting on the side of a flat bed without using bedrails?: None Help needed moving to and from a bed to a chair (including a wheelchair)?: None Help needed standing up from a chair using your arms (e.g., wheelchair or  bedside chair)?: None Help needed to walk in hospital room?: A Little Help needed climbing 3-5 steps with a railing? : A Lot 6 Click Score: 21    End of Session Equipment Utilized During Treatment: Oxygen Activity Tolerance: Patient tolerated treatment well Patient left: in bed;with call bell/phone within reach;with family/visitor present (sitting EOB) Nurse Communication: Mobility status PT Visit Diagnosis: Muscle weakness (generalized) (M62.81);Unsteadiness on feet (R26.81);Other abnormalities of gait and mobility (R26.89);Difficulty in walking, not elsewhere classified (R26.2)     Time: 7078-6754 PT Time Calculation (min) (ACUTE ONLY): 21 min  Charges:  $Therapeutic Exercise: 8-22 mins                     Wyona Almas, PT, DPT Acute Rehabilitation Services Pager [phone] Office [phone]    Deno Etienne 07/04/2021, 4:43 PM

## 2021-07-05 DIAGNOSIS — J4531 Mild persistent asthma with (acute) exacerbation: Secondary | ICD-10-CM | POA: Diagnosis not present

## 2021-07-05 DIAGNOSIS — D509 Iron deficiency anemia, unspecified: Secondary | ICD-10-CM | POA: Diagnosis not present

## 2021-07-05 DIAGNOSIS — J9601 Acute respiratory failure with hypoxia: Secondary | ICD-10-CM | POA: Diagnosis not present

## 2021-07-05 DIAGNOSIS — K219 Gastro-esophageal reflux disease without esophagitis: Secondary | ICD-10-CM | POA: Diagnosis not present

## 2021-07-05 DIAGNOSIS — J1282 Pneumonia due to coronavirus disease 2019: Secondary | ICD-10-CM | POA: Diagnosis not present

## 2021-07-05 DIAGNOSIS — I700 Atherosclerosis of aorta: Secondary | ICD-10-CM | POA: Diagnosis not present

## 2021-07-05 DIAGNOSIS — N1831 Chronic kidney disease, stage 3a: Secondary | ICD-10-CM | POA: Diagnosis not present

## 2021-07-05 DIAGNOSIS — R6520 Severe sepsis without septic shock: Secondary | ICD-10-CM | POA: Diagnosis not present

## 2021-07-05 DIAGNOSIS — M47815 Spondylosis without myelopathy or radiculopathy, thoracolumbar region: Secondary | ICD-10-CM | POA: Diagnosis not present

## 2021-07-05 DIAGNOSIS — A419 Sepsis, unspecified organism: Secondary | ICD-10-CM | POA: Diagnosis not present

## 2021-07-05 DIAGNOSIS — Z7982 Long term (current) use of aspirin: Secondary | ICD-10-CM | POA: Diagnosis not present

## 2021-07-05 DIAGNOSIS — E782 Mixed hyperlipidemia: Secondary | ICD-10-CM | POA: Diagnosis not present

## 2021-07-05 DIAGNOSIS — U071 COVID-19: Secondary | ICD-10-CM | POA: Diagnosis not present

## 2021-07-05 DIAGNOSIS — Z9181 History of falling: Secondary | ICD-10-CM | POA: Diagnosis not present

## 2021-07-05 DIAGNOSIS — R000 Tachycardia, unspecified: Secondary | ICD-10-CM | POA: Diagnosis not present

## 2021-07-05 DIAGNOSIS — Z9981 Dependence on supplemental oxygen: Secondary | ICD-10-CM | POA: Diagnosis not present

## 2021-07-05 DIAGNOSIS — Z96653 Presence of artificial knee joint, bilateral: Secondary | ICD-10-CM | POA: Diagnosis not present

## 2021-07-07 DIAGNOSIS — J9601 Acute respiratory failure with hypoxia: Secondary | ICD-10-CM | POA: Diagnosis not present

## 2021-07-07 DIAGNOSIS — J1282 Pneumonia due to coronavirus disease 2019: Secondary | ICD-10-CM | POA: Diagnosis not present

## 2021-07-07 DIAGNOSIS — R6520 Severe sepsis without septic shock: Secondary | ICD-10-CM | POA: Diagnosis not present

## 2021-07-07 DIAGNOSIS — U071 COVID-19: Secondary | ICD-10-CM | POA: Diagnosis not present

## 2021-07-07 DIAGNOSIS — A419 Sepsis, unspecified organism: Secondary | ICD-10-CM | POA: Diagnosis not present

## 2021-07-07 DIAGNOSIS — J4531 Mild persistent asthma with (acute) exacerbation: Secondary | ICD-10-CM | POA: Diagnosis not present

## 2021-07-12 DIAGNOSIS — U071 COVID-19: Secondary | ICD-10-CM | POA: Diagnosis not present

## 2021-07-12 DIAGNOSIS — A419 Sepsis, unspecified organism: Secondary | ICD-10-CM | POA: Diagnosis not present

## 2021-07-12 DIAGNOSIS — J9601 Acute respiratory failure with hypoxia: Secondary | ICD-10-CM | POA: Diagnosis not present

## 2021-07-12 DIAGNOSIS — R6520 Severe sepsis without septic shock: Secondary | ICD-10-CM | POA: Diagnosis not present

## 2021-07-12 DIAGNOSIS — J4531 Mild persistent asthma with (acute) exacerbation: Secondary | ICD-10-CM | POA: Diagnosis not present

## 2021-07-12 DIAGNOSIS — J1282 Pneumonia due to coronavirus disease 2019: Secondary | ICD-10-CM | POA: Diagnosis not present

## 2021-07-13 DIAGNOSIS — E782 Mixed hyperlipidemia: Secondary | ICD-10-CM | POA: Diagnosis not present

## 2021-07-13 DIAGNOSIS — K219 Gastro-esophageal reflux disease without esophagitis: Secondary | ICD-10-CM | POA: Diagnosis not present

## 2021-07-13 DIAGNOSIS — J4530 Mild persistent asthma, uncomplicated: Secondary | ICD-10-CM | POA: Diagnosis not present

## 2021-07-13 DIAGNOSIS — M810 Age-related osteoporosis without current pathological fracture: Secondary | ICD-10-CM | POA: Diagnosis not present

## 2021-07-13 DIAGNOSIS — J4521 Mild intermittent asthma with (acute) exacerbation: Secondary | ICD-10-CM | POA: Diagnosis not present

## 2021-07-13 DIAGNOSIS — D509 Iron deficiency anemia, unspecified: Secondary | ICD-10-CM | POA: Diagnosis not present

## 2021-07-13 DIAGNOSIS — F331 Major depressive disorder, recurrent, moderate: Secondary | ICD-10-CM | POA: Diagnosis not present

## 2021-07-13 DIAGNOSIS — J4520 Mild intermittent asthma, uncomplicated: Secondary | ICD-10-CM | POA: Diagnosis not present

## 2021-07-13 DIAGNOSIS — N183 Chronic kidney disease, stage 3 unspecified: Secondary | ICD-10-CM | POA: Diagnosis not present

## 2021-07-13 DIAGNOSIS — G4700 Insomnia, unspecified: Secondary | ICD-10-CM | POA: Diagnosis not present

## 2021-07-13 DIAGNOSIS — J4540 Moderate persistent asthma, uncomplicated: Secondary | ICD-10-CM | POA: Diagnosis not present

## 2021-07-13 DIAGNOSIS — M8580 Other specified disorders of bone density and structure, unspecified site: Secondary | ICD-10-CM | POA: Diagnosis not present

## 2021-07-13 DIAGNOSIS — J189 Pneumonia, unspecified organism: Secondary | ICD-10-CM | POA: Diagnosis not present

## 2021-07-14 DIAGNOSIS — J1282 Pneumonia due to coronavirus disease 2019: Secondary | ICD-10-CM | POA: Diagnosis not present

## 2021-07-14 DIAGNOSIS — J4531 Mild persistent asthma with (acute) exacerbation: Secondary | ICD-10-CM | POA: Diagnosis not present

## 2021-07-14 DIAGNOSIS — R6520 Severe sepsis without septic shock: Secondary | ICD-10-CM | POA: Diagnosis not present

## 2021-07-14 DIAGNOSIS — J9601 Acute respiratory failure with hypoxia: Secondary | ICD-10-CM | POA: Diagnosis not present

## 2021-07-14 DIAGNOSIS — A419 Sepsis, unspecified organism: Secondary | ICD-10-CM | POA: Diagnosis not present

## 2021-07-14 DIAGNOSIS — U071 COVID-19: Secondary | ICD-10-CM | POA: Diagnosis not present

## 2021-07-15 DIAGNOSIS — J4531 Mild persistent asthma with (acute) exacerbation: Secondary | ICD-10-CM | POA: Diagnosis not present

## 2021-07-15 DIAGNOSIS — J9601 Acute respiratory failure with hypoxia: Secondary | ICD-10-CM | POA: Diagnosis not present

## 2021-07-15 DIAGNOSIS — U071 COVID-19: Secondary | ICD-10-CM | POA: Diagnosis not present

## 2021-07-15 DIAGNOSIS — R6520 Severe sepsis without septic shock: Secondary | ICD-10-CM | POA: Diagnosis not present

## 2021-07-15 DIAGNOSIS — J1282 Pneumonia due to coronavirus disease 2019: Secondary | ICD-10-CM | POA: Diagnosis not present

## 2021-07-15 DIAGNOSIS — A419 Sepsis, unspecified organism: Secondary | ICD-10-CM | POA: Diagnosis not present

## 2021-07-19 DIAGNOSIS — R6520 Severe sepsis without septic shock: Secondary | ICD-10-CM | POA: Diagnosis not present

## 2021-07-19 DIAGNOSIS — J1282 Pneumonia due to coronavirus disease 2019: Secondary | ICD-10-CM | POA: Diagnosis not present

## 2021-07-19 DIAGNOSIS — J4531 Mild persistent asthma with (acute) exacerbation: Secondary | ICD-10-CM | POA: Diagnosis not present

## 2021-07-19 DIAGNOSIS — U071 COVID-19: Secondary | ICD-10-CM | POA: Diagnosis not present

## 2021-07-19 DIAGNOSIS — J9601 Acute respiratory failure with hypoxia: Secondary | ICD-10-CM | POA: Diagnosis not present

## 2021-07-19 DIAGNOSIS — A419 Sepsis, unspecified organism: Secondary | ICD-10-CM | POA: Diagnosis not present

## 2021-07-20 ENCOUNTER — Ambulatory Visit (INDEPENDENT_AMBULATORY_CARE_PROVIDER_SITE_OTHER): Payer: Medicare Other | Admitting: Pulmonary Disease

## 2021-07-20 ENCOUNTER — Other Ambulatory Visit: Payer: Self-pay

## 2021-07-20 ENCOUNTER — Encounter: Payer: Self-pay | Admitting: Pulmonary Disease

## 2021-07-20 VITALS — BP 130/70 | HR 120 | Temp 98.6°F | Ht 64.0 in | Wt 163.2 lb

## 2021-07-20 DIAGNOSIS — U099 Post covid-19 condition, unspecified: Secondary | ICD-10-CM

## 2021-07-20 DIAGNOSIS — J849 Interstitial pulmonary disease, unspecified: Secondary | ICD-10-CM | POA: Diagnosis not present

## 2021-07-20 MED ORDER — IPRATROPIUM-ALBUTEROL 0.5-2.5 (3) MG/3ML IN SOLN
3.0000 mL | RESPIRATORY_TRACT | 5 refills | Status: DC | PRN
Start: 2021-07-20 — End: 2021-07-27

## 2021-07-20 MED ORDER — TRELEGY ELLIPTA 200-62.5-25 MCG/ACT IN AEPB: 1.0000 | INHALATION_SPRAY | Freq: Every day | RESPIRATORY_TRACT | 2 refills | Status: DC

## 2021-07-20 NOTE — Progress Notes (Signed)
Tracy Young    [id number]    11-16-1945  Primary Care Physician:Reade, Herbie [place], MD  Referring Physician: Maury Dus, MD [place],  [place] [address]  Chief complaint:  Consult for Post COVID 27  HPI: 75 year old with history of hyperlipidemia,CKD stage IIIa, GERD, asthma She was hospitalized for acute COVID-19 pneumonia in October 2022.  Required high flow nasal cannula.  She was treated with steroids, baricitinib and gradually weaned down to 6 L oxygen on discharge.  She also got 10 days of prednisone on discharge Hospital course noted for elevated D-dimer with negative CT angiogram and lower extremity Dopplers for clot.  She was advised to use daily aspirin at discharge  Post discharge she is improved to 2 L oxygen but continues to have significant dyspnea on exertion, chronic cough which is nonproductive in nature.  No fevers or chills  Currently on Symbicort inhaler which she is using twice daily  Pets: No pets Occupation: Retired Optometrist Exposures: No exposures. Smoking history: Non-smoker Travel history: No significant travel history Relevant family history: No family history of lung disease  Outpatient Encounter Medications as of 07/20/2021  Medication Sig   albuterol (VENTOLIN HFA) 108 (90 Base) MCG/ACT inhaler Inhale 2 puffs into the lungs every 4 (four) hours as needed for wheezing or shortness of breath.   Ascorbic Acid (VITAMIN C) 1000 MG tablet Take 1,000 mg by mouth every morning.   aspirin 325 MG tablet Take 325 mg by mouth daily.   atorvastatin (LIPITOR) 10 MG tablet Take 5 mg by mouth See admin instructions. Take one tablet (10 mg) by mouth every other night   benzonatate (TESSALON PERLES) 100 MG capsule Take 1 capsule (100 mg total) by mouth 3 (three) times daily as needed for cough.   budesonide-formoterol (SYMBICORT) 80-4.5 MCG/ACT inhaler Inhale 2 puffs into the lungs 2 (two) times daily.   buPROPion (WELLBUTRIN XL)  300 MG 24 hr tablet Take 300 mg by mouth daily.   Cholecalciferol (VITAMIN D-3) 125 MCG (5000 UT) TABS Take 5,000 Units by mouth every morning.   Cyanocobalamin (VITAMIN B-12 PO) Take 1 tablet by mouth every morning.   DULoxetine (CYMBALTA) 60 MG capsule Take 60 mg by mouth at bedtime.   famotidine (PEPCID) 40 MG tablet Take 40 mg by mouth at bedtime.   Multiple Minerals-Vitamins (CALCIUM CITRATE-MAG-MINERALS) TABS Take 1 tablet by mouth every morning. Calcium Citrate plus magnesium and zinc   NYAMYC powder Apply 1 application topically daily. Apply to legs after showering   pantoprazole (PROTONIX) 40 MG tablet Take 40 mg by mouth every morning.   raloxifene (EVISTA) 60 MG tablet Take 60 mg by mouth daily.   zolpidem (AMBIEN) 10 MG tablet Take 10 mg by mouth at bedtime.   DULoxetine (CYMBALTA) 30 MG capsule Take 30 mg by mouth daily. (Patient not taking: No sig reported)   predniSONE (DELTASONE) 10 MG tablet Take 4 tablets (40 mg) daily for 2 days, 3 tabs (30 mg) daily for 2 days, 2 tabs (20 mg) daily for 2 days, then 1 tab (10 mg) daily for 1 days, then stop   No facility-administered encounter medications on file as of 07/20/2021.    Allergies as of 07/20/2021 - Review Complete 07/20/2021  Allergen Reaction Noted   Doxycycline Anxiety and Nausea Only 06/27/2021    Past Medical History:  Diagnosis Date   Chronic kidney disease, stage 3a ([facility]) 06/27/2021   GERD without esophagitis 06/27/2021  Mild intermittent asthma without complication 27/51/7001   Mixed hyperlipidemia 06/27/2021    Past Surgical History:  Procedure Laterality Date   TOTAL KNEE ARTHROPLASTY      Family History  Problem Relation Age of Onset   Heart disease Neg Hx     Social History   Socioeconomic History   Marital status: Married    Spouse name: Not on file   Number of children: Not on file   Years of education: Not on file   Highest education level: Not on file  Occupational History   Not on file   Tobacco Use   Smoking status: Never    Passive exposure: Never   Smokeless tobacco: Never  Substance and Sexual Activity   Alcohol use: Never   Drug use: Never   Sexual activity: Not on file  Other Topics Concern   Not on file  Social History Narrative   Not on file   Social Determinants of Health   Financial Resource Strain: Not on file  Food Insecurity: Not on file  Transportation Needs: Not on file  Physical Activity: Not on file  Stress: Not on file  Social Connections: Not on file  Intimate Partner Violence: Not on file    Review of systems: Review of Systems  Constitutional: Negative for fever and chills.  HENT: Negative.   Eyes: Negative for blurred vision.  Respiratory: as per HPI  Cardiovascular: Negative for chest pain and palpitations.  Gastrointestinal: Negative for vomiting, diarrhea, blood per rectum. Genitourinary: Negative for dysuria, urgency, frequency and hematuria.  Musculoskeletal: Negative for myalgias, back pain and joint pain.  Skin: Negative for itching and rash.  Neurological: Negative for dizziness, tremors, focal weakness, seizures and loss of consciousness.  Endo/Heme/Allergies: Negative for environmental allergies.  Psychiatric/Behavioral: Negative for depression, suicidal ideas and hallucinations.  All other systems reviewed and are negative.  Physical Exam: Blood pressure 130/70, pulse (!) 120, temperature 98.6 F (37 C), temperature source Oral, height 5\' 4"  (1.626 m), weight 163 lb 3.2 oz (74 kg), SpO2 95 %. Gen:      No acute distress HEENT:  EOMI, sclera anicteric Neck:     No masses; no thyromegaly Lungs:    Scattered bibasilar crackles CV:         Regular rate and rhythm; no murmurs Abd:      + bowel sounds; soft, non-tender; no palpable masses, no distension Ext:    No edema; adequate peripheral perfusion Skin:      Warm and dry; no rash Neuro: alert and oriented x 3 Psych: normal mood and affect  Data  Reviewed: Imaging: CTA 06/28/2021-no pulmonary embolism, bilateral confluent groundglass opacities CTA 07/03/2021-no pulmonary embolism, bilateral groundglass opacities with mild improvement I have reviewed the images personally.  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  If there is persistent inflammation then consider a longer course of steroids Referral to pulmonary rehab for post COVID-19  Plan/Recommendations: Nebs Change Symbicort to Trelegy High-res CT, PFTs Pulmonary rehab  Marshell Garfinkel MD [facility] 07/20/2021, 3:56 PM  CC: Maury Dus, MD

## 2021-07-20 NOTE — Patient Instructions (Signed)
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

## 2021-07-21 ENCOUNTER — Telehealth (HOSPITAL_COMMUNITY): Payer: Self-pay

## 2021-07-21 DIAGNOSIS — J9601 Acute respiratory failure with hypoxia: Secondary | ICD-10-CM | POA: Diagnosis not present

## 2021-07-21 DIAGNOSIS — M5412 Radiculopathy, cervical region: Secondary | ICD-10-CM | POA: Diagnosis not present

## 2021-07-21 DIAGNOSIS — R6520 Severe sepsis without septic shock: Secondary | ICD-10-CM | POA: Diagnosis not present

## 2021-07-21 DIAGNOSIS — G5603 Carpal tunnel syndrome, bilateral upper limbs: Secondary | ICD-10-CM | POA: Diagnosis not present

## 2021-07-21 DIAGNOSIS — M545 Low back pain, unspecified: Secondary | ICD-10-CM | POA: Diagnosis not present

## 2021-07-21 DIAGNOSIS — G603 Idiopathic progressive neuropathy: Secondary | ICD-10-CM | POA: Diagnosis not present

## 2021-07-21 DIAGNOSIS — A419 Sepsis, unspecified organism: Secondary | ICD-10-CM | POA: Diagnosis not present

## 2021-07-21 DIAGNOSIS — J4531 Mild persistent asthma with (acute) exacerbation: Secondary | ICD-10-CM | POA: Diagnosis not present

## 2021-07-21 DIAGNOSIS — J1282 Pneumonia due to coronavirus disease 2019: Secondary | ICD-10-CM | POA: Diagnosis not present

## 2021-07-21 DIAGNOSIS — U071 COVID-19: Secondary | ICD-10-CM | POA: Diagnosis not present

## 2021-07-21 NOTE — Telephone Encounter (Signed)
Attempted to contact pt in regards to PR, LMTCB. 

## 2021-07-25 DIAGNOSIS — J4531 Mild persistent asthma with (acute) exacerbation: Secondary | ICD-10-CM | POA: Diagnosis not present

## 2021-07-25 DIAGNOSIS — A419 Sepsis, unspecified organism: Secondary | ICD-10-CM | POA: Diagnosis not present

## 2021-07-25 DIAGNOSIS — J9601 Acute respiratory failure with hypoxia: Secondary | ICD-10-CM | POA: Diagnosis not present

## 2021-07-25 DIAGNOSIS — R6520 Severe sepsis without septic shock: Secondary | ICD-10-CM | POA: Diagnosis not present

## 2021-07-25 DIAGNOSIS — U071 COVID-19: Secondary | ICD-10-CM | POA: Diagnosis not present

## 2021-07-25 DIAGNOSIS — J1282 Pneumonia due to coronavirus disease 2019: Secondary | ICD-10-CM | POA: Diagnosis not present

## 2021-07-26 ENCOUNTER — Telehealth (HOSPITAL_COMMUNITY): Payer: Self-pay | Admitting: Family Medicine

## 2021-07-26 DIAGNOSIS — F331 Major depressive disorder, recurrent, moderate: Secondary | ICD-10-CM | POA: Diagnosis not present

## 2021-07-26 DIAGNOSIS — E782 Mixed hyperlipidemia: Secondary | ICD-10-CM | POA: Diagnosis not present

## 2021-07-26 DIAGNOSIS — K5900 Constipation, unspecified: Secondary | ICD-10-CM | POA: Diagnosis not present

## 2021-07-26 DIAGNOSIS — Z23 Encounter for immunization: Secondary | ICD-10-CM | POA: Diagnosis not present

## 2021-07-26 DIAGNOSIS — J4520 Mild intermittent asthma, uncomplicated: Secondary | ICD-10-CM | POA: Diagnosis not present

## 2021-07-26 DIAGNOSIS — R6520 Severe sepsis without septic shock: Secondary | ICD-10-CM | POA: Diagnosis not present

## 2021-07-26 DIAGNOSIS — G4700 Insomnia, unspecified: Secondary | ICD-10-CM | POA: Diagnosis not present

## 2021-07-26 DIAGNOSIS — M810 Age-related osteoporosis without current pathological fracture: Secondary | ICD-10-CM | POA: Diagnosis not present

## 2021-07-26 DIAGNOSIS — A419 Sepsis, unspecified organism: Secondary | ICD-10-CM | POA: Diagnosis not present

## 2021-07-26 DIAGNOSIS — U071 COVID-19: Secondary | ICD-10-CM | POA: Diagnosis not present

## 2021-07-26 DIAGNOSIS — K219 Gastro-esophageal reflux disease without esophagitis: Secondary | ICD-10-CM | POA: Diagnosis not present

## 2021-07-26 DIAGNOSIS — J1282 Pneumonia due to coronavirus disease 2019: Secondary | ICD-10-CM | POA: Diagnosis not present

## 2021-07-26 DIAGNOSIS — E2839 Other primary ovarian failure: Secondary | ICD-10-CM | POA: Diagnosis not present

## 2021-07-26 DIAGNOSIS — J4531 Mild persistent asthma with (acute) exacerbation: Secondary | ICD-10-CM | POA: Diagnosis not present

## 2021-07-26 DIAGNOSIS — B356 Tinea cruris: Secondary | ICD-10-CM | POA: Diagnosis not present

## 2021-07-26 DIAGNOSIS — M8588 Other specified disorders of bone density and structure, other site: Secondary | ICD-10-CM | POA: Diagnosis not present

## 2021-07-26 DIAGNOSIS — J9601 Acute respiratory failure with hypoxia: Secondary | ICD-10-CM | POA: Diagnosis not present

## 2021-07-26 DIAGNOSIS — N1831 Chronic kidney disease, stage 3a: Secondary | ICD-10-CM | POA: Diagnosis not present

## 2021-07-27 ENCOUNTER — Telehealth: Payer: Self-pay | Admitting: Pulmonary Disease

## 2021-07-27 MED ORDER — IPRATROPIUM-ALBUTEROL 0.5-2.5 (3) MG/3ML IN SOLN: 3.0000 mL | RESPIRATORY_TRACT | 5 refills | Status: DC | PRN

## 2021-07-27 MED ORDER — IPRATROPIUM-ALBUTEROL 0.5-2.5 (3) MG/3ML IN SOLN
3.0000 mL | RESPIRATORY_TRACT | 5 refills | Status: DC | PRN
Start: 2021-07-27 — End: 2021-07-27

## 2021-07-27 NOTE — Telephone Encounter (Signed)
I have sent the refill to the pharmacy for the albuterol neb meds

## 2021-07-28 DIAGNOSIS — J9601 Acute respiratory failure with hypoxia: Secondary | ICD-10-CM | POA: Diagnosis not present

## 2021-07-28 DIAGNOSIS — J1282 Pneumonia due to coronavirus disease 2019: Secondary | ICD-10-CM | POA: Diagnosis not present

## 2021-07-28 DIAGNOSIS — U071 COVID-19: Secondary | ICD-10-CM | POA: Diagnosis not present

## 2021-07-28 DIAGNOSIS — J4531 Mild persistent asthma with (acute) exacerbation: Secondary | ICD-10-CM | POA: Diagnosis not present

## 2021-07-28 DIAGNOSIS — A419 Sepsis, unspecified organism: Secondary | ICD-10-CM | POA: Diagnosis not present

## 2021-07-28 DIAGNOSIS — R6520 Severe sepsis without septic shock: Secondary | ICD-10-CM | POA: Diagnosis not present

## 2021-07-29 ENCOUNTER — Telehealth: Payer: Self-pay | Admitting: Pulmonary Disease

## 2021-07-29 NOTE — Telephone Encounter (Signed)
Called and spoke with Patient's spouse Tracy Young (DPR).  Patient was also listening to call.  Tracy Young stated they received neb machine and were unsure if they had to wait until DME showed then how to use machine.   Offered to show them how to use neb machine, if they brought it to the office.  [name] declined.Tracy Young stated they had watched a video online and felt they were comfortable enough to start nebs.  I did go over using neb machine and cleaning mouthpiece/mask as needed. Understanding stated.  Nothing further at this time.

## 2021-08-02 DIAGNOSIS — R6520 Severe sepsis without septic shock: Secondary | ICD-10-CM | POA: Diagnosis not present

## 2021-08-02 DIAGNOSIS — A419 Sepsis, unspecified organism: Secondary | ICD-10-CM | POA: Diagnosis not present

## 2021-08-02 DIAGNOSIS — J4531 Mild persistent asthma with (acute) exacerbation: Secondary | ICD-10-CM | POA: Diagnosis not present

## 2021-08-02 DIAGNOSIS — U071 COVID-19: Secondary | ICD-10-CM | POA: Diagnosis not present

## 2021-08-02 DIAGNOSIS — J1282 Pneumonia due to coronavirus disease 2019: Secondary | ICD-10-CM | POA: Diagnosis not present

## 2021-08-02 DIAGNOSIS — J9601 Acute respiratory failure with hypoxia: Secondary | ICD-10-CM | POA: Diagnosis not present

## 2021-08-04 DIAGNOSIS — K219 Gastro-esophageal reflux disease without esophagitis: Secondary | ICD-10-CM | POA: Diagnosis not present

## 2021-08-04 DIAGNOSIS — U071 COVID-19: Secondary | ICD-10-CM | POA: Diagnosis not present

## 2021-08-04 DIAGNOSIS — N1831 Chronic kidney disease, stage 3a: Secondary | ICD-10-CM | POA: Diagnosis not present

## 2021-08-04 DIAGNOSIS — E782 Mixed hyperlipidemia: Secondary | ICD-10-CM | POA: Diagnosis not present

## 2021-08-04 DIAGNOSIS — M47815 Spondylosis without myelopathy or radiculopathy, thoracolumbar region: Secondary | ICD-10-CM | POA: Diagnosis not present

## 2021-08-04 DIAGNOSIS — J4531 Mild persistent asthma with (acute) exacerbation: Secondary | ICD-10-CM | POA: Diagnosis not present

## 2021-08-04 DIAGNOSIS — Z96653 Presence of artificial knee joint, bilateral: Secondary | ICD-10-CM | POA: Diagnosis not present

## 2021-08-04 DIAGNOSIS — J1282 Pneumonia due to coronavirus disease 2019: Secondary | ICD-10-CM | POA: Diagnosis not present

## 2021-08-04 DIAGNOSIS — R6520 Severe sepsis without septic shock: Secondary | ICD-10-CM | POA: Diagnosis not present

## 2021-08-04 DIAGNOSIS — Z9981 Dependence on supplemental oxygen: Secondary | ICD-10-CM | POA: Diagnosis not present

## 2021-08-04 DIAGNOSIS — Z9181 History of falling: Secondary | ICD-10-CM | POA: Diagnosis not present

## 2021-08-04 DIAGNOSIS — Z7982 Long term (current) use of aspirin: Secondary | ICD-10-CM | POA: Diagnosis not present

## 2021-08-04 DIAGNOSIS — A419 Sepsis, unspecified organism: Secondary | ICD-10-CM | POA: Diagnosis not present

## 2021-08-04 DIAGNOSIS — D509 Iron deficiency anemia, unspecified: Secondary | ICD-10-CM | POA: Diagnosis not present

## 2021-08-04 DIAGNOSIS — I700 Atherosclerosis of aorta: Secondary | ICD-10-CM | POA: Diagnosis not present

## 2021-08-04 DIAGNOSIS — J9601 Acute respiratory failure with hypoxia: Secondary | ICD-10-CM | POA: Diagnosis not present

## 2021-08-04 DIAGNOSIS — R000 Tachycardia, unspecified: Secondary | ICD-10-CM | POA: Diagnosis not present

## 2021-08-07 ENCOUNTER — Encounter (HOSPITAL_COMMUNITY): Payer: Self-pay | Admitting: *Deleted

## 2021-08-07 NOTE — Progress Notes (Signed)
Received referral from Dr. Vaughan Browner for this pt to participate in pulmonary rehab with the primary diagnosis of dyspnea and secondary Covid 19 unspecified. PT with greater than four weeks of persistent respiratory dysfunction of dyspnea and cough.Clinical review of pt follow up appt on 11/2 Pulmonary office note.  Pt with Covid Risk Score - 3. Pt appropriate for scheduling as there is a wait list for Pulmonary rehab.  Will forward to support staff for scheduling and verification of insurance eligibility/benefits with pt consent. Cherre Huger, BSN Cardiac and Training and development officer

## 2021-08-08 ENCOUNTER — Ambulatory Visit
Admission: RE | Admit: 2021-08-08 | Discharge: 2021-08-08 | Disposition: A | Discharge status: Home / Self Care | Payer: Medicare Other | Source: Ambulatory Visit | Attending: Pulmonary Disease | Admitting: Pulmonary Disease

## 2021-08-08 ENCOUNTER — Other Ambulatory Visit: Payer: Self-pay

## 2021-08-08 DIAGNOSIS — Z8701 Personal history of pneumonia (recurrent): Secondary | ICD-10-CM | POA: Diagnosis not present

## 2021-08-08 DIAGNOSIS — I700 Atherosclerosis of aorta: Secondary | ICD-10-CM | POA: Diagnosis not present

## 2021-08-08 DIAGNOSIS — J479 Bronchiectasis, uncomplicated: Secondary | ICD-10-CM | POA: Diagnosis not present

## 2021-08-08 DIAGNOSIS — I2510 Atherosclerotic heart disease of native coronary artery without angina pectoris: Secondary | ICD-10-CM | POA: Diagnosis not present

## 2021-08-08 DIAGNOSIS — J849 Interstitial pulmonary disease, unspecified: Secondary | ICD-10-CM

## 2021-08-09 DIAGNOSIS — J4531 Mild persistent asthma with (acute) exacerbation: Secondary | ICD-10-CM | POA: Diagnosis not present

## 2021-08-09 DIAGNOSIS — R6520 Severe sepsis without septic shock: Secondary | ICD-10-CM | POA: Diagnosis not present

## 2021-08-09 DIAGNOSIS — J1282 Pneumonia due to coronavirus disease 2019: Secondary | ICD-10-CM | POA: Diagnosis not present

## 2021-08-09 DIAGNOSIS — U071 COVID-19: Secondary | ICD-10-CM | POA: Diagnosis not present

## 2021-08-09 DIAGNOSIS — J9601 Acute respiratory failure with hypoxia: Secondary | ICD-10-CM | POA: Diagnosis not present

## 2021-08-09 DIAGNOSIS — A419 Sepsis, unspecified organism: Secondary | ICD-10-CM | POA: Diagnosis not present

## 2021-08-12 ENCOUNTER — Emergency Department (HOSPITAL_BASED_OUTPATIENT_CLINIC_OR_DEPARTMENT_OTHER)
Admission: EM | Admit: 2021-08-12 | Discharge: 2021-08-12 | Disposition: A | Discharge status: Home / Self Care | Payer: Medicare Other | Attending: Emergency Medicine | Admitting: Emergency Medicine

## 2021-08-12 ENCOUNTER — Other Ambulatory Visit: Payer: Self-pay

## 2021-08-12 ENCOUNTER — Encounter (HOSPITAL_BASED_OUTPATIENT_CLINIC_OR_DEPARTMENT_OTHER): Payer: Self-pay | Admitting: *Deleted

## 2021-08-12 ENCOUNTER — Emergency Department (HOSPITAL_BASED_OUTPATIENT_CLINIC_OR_DEPARTMENT_OTHER): Payer: Medicare Other

## 2021-08-12 DIAGNOSIS — Z8616 Personal history of COVID-19: Secondary | ICD-10-CM | POA: Diagnosis not present

## 2021-08-12 DIAGNOSIS — W01198A Fall on same level from slipping, tripping and stumbling with subsequent striking against other object, initial encounter: Secondary | ICD-10-CM | POA: Insufficient documentation

## 2021-08-12 DIAGNOSIS — S0990XA Unspecified injury of head, initial encounter: Secondary | ICD-10-CM | POA: Diagnosis not present

## 2021-08-12 DIAGNOSIS — N1831 Chronic kidney disease, stage 3a: Secondary | ICD-10-CM | POA: Insufficient documentation

## 2021-08-12 DIAGNOSIS — J45909 Unspecified asthma, uncomplicated: Secondary | ICD-10-CM | POA: Insufficient documentation

## 2021-08-12 DIAGNOSIS — Z7951 Long term (current) use of inhaled steroids: Secondary | ICD-10-CM | POA: Insufficient documentation

## 2021-08-12 DIAGNOSIS — S0101XA Laceration without foreign body of scalp, initial encounter: Secondary | ICD-10-CM | POA: Diagnosis not present

## 2021-08-12 DIAGNOSIS — Z7982 Long term (current) use of aspirin: Secondary | ICD-10-CM | POA: Diagnosis not present

## 2021-08-12 HISTORY — DX: Pneumonia, unspecified organism: J18.9

## 2021-08-12 HISTORY — DX: COVID-19: U07.1

## 2021-08-12 NOTE — ED Triage Notes (Addendum)
Pt fell this morning while dressing, laceration to post head near left ear. Bleeding controlled. Pt states she is weak due to covid and pneumonia recently. No LOC nor on blood thinners

## 2021-08-12 NOTE — ED Provider Notes (Signed)
[facility] EMERGENCY DEPT Provider Note   CSN: [id number] Arrival date & time: 08/12/21  1237     History Chief Complaint  Patient presents with   Fall   Head Injury    Tracy Young is a 75 y.o. female.  HPI  75 year old female with past medical history of CKD, recent COVID infection with pneumonia and hospitalization presents emergency department with fall and head injury.  Patient was recently admitted and discharged, still following with PT/OT.  Since her admission she has been chronically tachycardic.  Patient states while she was home she was getting dressed in the closet, lost her footing and fell back hitting the side of her head on the doorway.  She sustained a laceration.  There is no loss of consciousness.  She is not on any blood thinners.  Currently complaining of a mild left posterior headache without any neck pain.  No other injury sustained.  She is otherwise been improving since her hospitalization.  Eating and drinking at baseline with no other new complaints in regards to COVID-pneumonia.  Past Medical History:  Diagnosis Date   Chronic kidney disease, stage 3a ([place]) 06/27/2021   COVID-19    GERD without esophagitis 06/27/2021   Mild intermittent asthma without complication 14/48/1856   Mixed hyperlipidemia 06/27/2021   Pneumonia     Patient Active Problem List   Diagnosis Date Noted   COVID-19 virus infection 06/27/2021   Acute respiratory failure with hypoxia ([place]) 06/27/2021   Chronic kidney disease, stage 3a ([facility]) 06/27/2021   GERD without esophagitis 06/27/2021   Mixed hyperlipidemia 06/27/2021   Mild intermittent asthma with (acute) exacerbation 06/27/2021   Sepsis due to COVID-19 [facility]) 06/27/2021    Past Surgical History:  Procedure Laterality Date   TOTAL KNEE ARTHROPLASTY       OB History   No obstetric history on file.     Family History  Problem Relation Age of Onset   Heart disease Neg Hx     Social History    Tobacco Use   Smoking status: Never    Passive exposure: Never   Smokeless tobacco: Never  Vaping Use   Vaping Use: Never used  Substance Use Topics   Alcohol use: Never   Drug use: Never    Home Medications Prior to Admission medications   Medication Sig Start Date End Date Taking? Authorizing Provider  albuterol (VENTOLIN HFA) 108 (90 Base) MCG/ACT inhaler Inhale 2 puffs into the lungs every 4 (four) hours as needed for wheezing or shortness of breath. 07/04/21  Yes Ghimire, Henreitta Leber, MD  Ascorbic Acid (VITAMIN C) 1000 MG tablet Take 1,000 mg by mouth every morning.   Yes [provider]  aspirin 325 MG tablet Take 325 mg by mouth daily.   Yes [provider]  atorvastatin (LIPITOR) 10 MG tablet Take 5 mg by mouth See admin instructions. Take one tablet (10 mg) by mouth every other night 04/02/21  Yes [provider]  benzonatate (TESSALON PERLES) 100 MG capsule Take 1 capsule (100 mg total) by mouth 3 (three) times daily as needed for cough. 07/04/21 07/04/22 Yes Ghimire, Henreitta Leber, MD  buPROPion (WELLBUTRIN XL) 300 MG 24 hr tablet Take 300 mg by mouth daily. 04/02/21  Yes [provider]  Cholecalciferol (VITAMIN D-3) 125 MCG (5000 UT) TABS Take 5,000 Units by mouth every morning.   Yes [provider]  Cyanocobalamin (VITAMIN B-12 PO) Take 1 tablet by mouth every morning.   Yes [provider]  DULoxetine (CYMBALTA) 60 MG capsule Take 60 mg by mouth at bedtime.   Yes [provider]  famotidine (PEPCID) 40 MG tablet Take 40 mg by mouth at bedtime.   Yes [provider]  ipratropium-albuterol (DUONEB) 0.5-2.5 (3) MG/3ML SOLN Take 3 mLs by nebulization every 4 (four) hours as needed. 07/27/21  Yes Mannam, Praveen, MD  Multiple Minerals-Vitamins (CALCIUM CITRATE-MAG-MINERALS) TABS Take 1 tablet by mouth every morning. Calcium Citrate plus magnesium and zinc   Yes [provider]  [facility] powder Apply 1  application topically daily. Apply to legs after showering 06/19/21  Yes [provider]  pantoprazole (PROTONIX) 40 MG tablet Take 40 mg by mouth every morning. 04/02/21  Yes [provider]  raloxifene (EVISTA) 60 MG tablet Take 60 mg by mouth daily. 04/02/21  Yes [provider]  budesonide-formoterol (SYMBICORT) 80-4.5 MCG/ACT inhaler Inhale 2 puffs into the lungs 2 (two) times daily. 07/04/21   Ghimire, Henreitta Leber, MD  DULoxetine (CYMBALTA) 30 MG capsule Take 30 mg by mouth daily. Patient not taking: No sig reported 06/22/21   [provider]  Fluticasone-Umeclidin-Vilant (TRELEGY ELLIPTA) 200-62.5-25 MCG/ACT AEPB Inhale 1 puff into the lungs daily. 07/20/21   Mannam, Hart Robinsons, MD  predniSONE (DELTASONE) 10 MG tablet Take 4 tablets (40 mg) daily for 2 days, 3 tabs (30 mg) daily for 2 days, 2 tabs (20 mg) daily for 2 days, then 1 tab (10 mg) daily for 1 days, then stop 07/04/21   Ghimire, Henreitta Leber, MD  zolpidem (AMBIEN) 10 MG tablet Take 10 mg by mouth at bedtime. 02/10/21   [provider]    Allergies    Doxycycline  Review of Systems   Review of Systems  Constitutional:  Negative for chills and fever.  HENT:  Negative for congestion.   Eyes:  Negative for visual disturbance.  Respiratory:  Negative for shortness of breath.   Cardiovascular:  Negative for chest pain, palpitations and leg swelling.  Gastrointestinal:  Negative for abdominal pain, diarrhea and vomiting.  Genitourinary:  Negative for dysuria.  Musculoskeletal:  Negative for back pain and neck pain.  Skin:  Positive for wound. Negative for rash.  Neurological:  Positive for headaches.   Physical Exam Updated Vital Signs BP (!) 159/99   Pulse (!) 118   Temp 97.8 F (36.6 C)   Resp 18   Ht 5' 4.5" (1.638 m)   Wt 72.6 kg   SpO2 100%   BMI 27.04 kg/m   Physical Exam Vitals and nursing note reviewed.  Constitutional:      Appearance: Normal appearance.  HENT:     Head:      Comments: Left posterior scalp hematoma with approximately 2 cm linear abrasion with about a 0.5 cm area that is an open laceration, bleeding controlled    Mouth/Throat:     Mouth: Mucous membranes are moist.  Eyes:     Extraocular Movements: Extraocular movements intact.     Pupils: Pupils are equal, round, and reactive to light.  Cardiovascular:     Rate and Rhythm: Normal rate.  Pulmonary:     Effort: Pulmonary effort is normal. No respiratory distress.  Abdominal:     Palpations: Abdomen is soft.     Tenderness: There is no abdominal tenderness.  Musculoskeletal:        General: No swelling or deformity.     Cervical back: No rigidity or tenderness.  Skin:    General: Skin is warm.  Neurological:  General: No focal deficit present.     Mental Status: She is alert and oriented to person, place, and time. Mental status is at baseline.  Psychiatric:        Mood and Affect: Mood normal.    ED Results / Procedures / Treatments   Labs (all labs ordered are listed, but only abnormal results are displayed) Labs Reviewed - No data to display  EKG None  Radiology CT Head Wo Contrast  Result Date: 08/12/2021 CLINICAL DATA:  Head trauma EXAM: CT HEAD WITHOUT CONTRAST TECHNIQUE: Contiguous axial images were obtained from the base of the skull through the vertex without intravenous contrast. COMPARISON:  None. FINDINGS: Brain: No acute intracranial hemorrhage, mass effect, or herniation. No extra-axial fluid collections. No evidence of acute territorial infarct. No hydrocephalus. Mild cortical volume loss. Mild chronic microvascular ischemic changes. Vascular: No hyperdense vessel or unexpected calcification. Skull: Normal. Negative for fracture or focal lesion. Sinuses/Orbits: No acute finding. Other: Small left parietal scalp swelling/injury. IMPRESSION: No acute intracranial process identified. Left parietal scalp injury. Electronically Signed   By: Ofilia Neas M.D.   On:  08/12/2021 14:01    Procedures .Marland KitchenLaceration Repair  Date/Time: 08/12/2021 4:45 PM Performed by: Lorelle Gibbs, DO Authorized by: Lorelle Gibbs, DO   Consent:    Consent obtained:  Verbal   Consent given by:  Patient   Risks discussed:  Infection, need for additional repair, poor cosmetic result and poor wound healing   Alternatives discussed:  No treatment Universal protocol:    Procedure explained and questions answered to patient or proxy's satisfaction: yes     Imaging studies available: yes     Patient identity confirmed:  Verbally with patient and arm band Anesthesia:    Anesthesia method:  None Laceration details:    Location:  Scalp   Scalp location:  L parietal   Length (cm):  1 Exploration:    Hemostasis achieved with:  Direct pressure   Contaminated: no   Treatment:    Area cleansed with:  Saline   Amount of cleaning:  Standard   Irrigation solution:  Sterile saline   Irrigation method:  Syringe   Visualized foreign bodies/material removed: no     Debridement:  None   Undermining:  None Skin repair:    Repair method:  Staples   Number of staples:  1 Approximation:    Approximation:  Close Repair type:    Repair type:  Simple Post-procedure details:    Dressing:  Open (no dressing)   Medications Ordered in ED Medications - No data to display  ED Course  I have reviewed the triage vital signs and the nursing notes.  Pertinent labs & imaging results that were available during my care of the patient were reviewed by me and considered in my medical decision making (see chart for details).    MDM Rules/Calculators/A&P                           75 year old female presents emergency department with head injury.  No loss of consciousness, no anticoagulation.  Currently complaining of a mild posterior headache, no other injury sustained.  Of note since the patient was admitted for COVID-pneumonia she has had chronic tachycardia.  We see this today.   In regards to COVID-pneumonia she has no ongoing complaints, no shortness of breath or chest pain.  No swelling of her lower extremities.  She has been baseline, eating and drinking  without any problems.  CT of the head is unremarkable.  She has no neck pain or tenderness on exam.  The small scalp laceration was repaired with 1 staple.  We will have her follow-up as an outpatient for removal.  She had some mild hypertension while here, appeared anxious.  I recommended that they keep a diary of this over the weekend and follow-up with her primary doctor on Monday for reevaluation of this.  She currently is on any medications for hypertension.  Patient at this time appears safe and stable for discharge and will be treated as an outpatient.  Discharge plan and strict return to ED precautions discussed, patient verbalizes understanding and agreement.  Final Clinical Impression(s) / ED Diagnoses Final diagnoses:  Injury of head, initial encounter  Laceration of scalp, initial encounter    Rx / DC Orders ED Discharge Orders     None        Lorelle Gibbs, DO 08/12/21 1646

## 2021-08-12 NOTE — Discharge Instructions (Addendum)
You have been seen and discharged from the emergency department.  Your head CT was negative.  You had a small scalp laceration that was repaired with 1 staple.  This will need to be removed in 7 to 10 days.  Your primary doctor will be able to do this.  You may let soapy water run over the area, do not scrub the area directly.  Pat the area dry, do not use a blow dryer on this area, be careful when brushing your hair.  In regards to your elevated blood pressure, this is most likely circumstantial.  Keep a diary of the blood pressure over the weekend and have this reevaluated by primary doctor to see if you qualify for medication.  Follow-up with your primary provider for reevaluation and further care. Take home medications as prescribed. If you have any worsening symptoms or further concerns for your health please return to an emergency department for further evaluation.

## 2021-08-14 ENCOUNTER — Other Ambulatory Visit: Payer: Self-pay

## 2021-08-14 ENCOUNTER — Encounter (HOSPITAL_BASED_OUTPATIENT_CLINIC_OR_DEPARTMENT_OTHER): Payer: Self-pay | Admitting: Emergency Medicine

## 2021-08-14 ENCOUNTER — Emergency Department (HOSPITAL_BASED_OUTPATIENT_CLINIC_OR_DEPARTMENT_OTHER)
Admission: EM | Admit: 2021-08-14 | Discharge: 2021-08-14 | Disposition: A | Discharge status: Home / Self Care | Payer: Medicare Other | Attending: Emergency Medicine | Admitting: Emergency Medicine

## 2021-08-14 DIAGNOSIS — Z79899 Other long term (current) drug therapy: Secondary | ICD-10-CM | POA: Insufficient documentation

## 2021-08-14 DIAGNOSIS — N1831 Chronic kidney disease, stage 3a: Secondary | ICD-10-CM | POA: Diagnosis not present

## 2021-08-14 DIAGNOSIS — Z7982 Long term (current) use of aspirin: Secondary | ICD-10-CM | POA: Diagnosis not present

## 2021-08-14 DIAGNOSIS — Z96653 Presence of artificial knee joint, bilateral: Secondary | ICD-10-CM | POA: Insufficient documentation

## 2021-08-14 DIAGNOSIS — R000 Tachycardia, unspecified: Secondary | ICD-10-CM | POA: Diagnosis not present

## 2021-08-14 DIAGNOSIS — J45909 Unspecified asthma, uncomplicated: Secondary | ICD-10-CM | POA: Diagnosis not present

## 2021-08-14 DIAGNOSIS — I129 Hypertensive chronic kidney disease with stage 1 through stage 4 chronic kidney disease, or unspecified chronic kidney disease: Secondary | ICD-10-CM | POA: Diagnosis not present

## 2021-08-14 DIAGNOSIS — I10 Essential (primary) hypertension: Secondary | ICD-10-CM | POA: Diagnosis not present

## 2021-08-14 DIAGNOSIS — Z8616 Personal history of COVID-19: Secondary | ICD-10-CM | POA: Insufficient documentation

## 2021-08-14 LAB — TSH: TSH: 1.916 u[IU]/mL (ref 0.350–4.500)

## 2021-08-14 NOTE — ED Provider Notes (Signed)
[facility] EMERGENCY DEPT Provider Note   CSN: [id number] Arrival date & time: 08/14/21  1325     History Chief Complaint  Patient presents with   Hypertension    Tracy Young is a 75 y.o. female.  Patient is a 75 year old female with a history of hyperlipidemia, asthma and recent COVID infection in October with hospitalization, pneumonia and sepsis who has had persistent tachycardia since her admission who is presenting today with complaints of elevated blood pressure.  Patient reports that she was seen in the emergency room 2 days ago after she had a fall because she missed stepped and hit her head.  While she was in the emergency room she was noted to be hypertensive which she was not aware of prior.  They have been checking it at home and her husband reports today the blood pressures have been 190/100s.  He did use the cuff and checked himself and his blood pressure seemed okay.  She denies any new symptoms.  She has not had chest pain, no new shortness of breath.  Since returning from the hospital she has had exertional dyspnea still wears oxygen at night and occasionally throughout the day.  She has not had a new cough or fever.  She has not had any nausea or vomiting.  Before leaving the hospital less than a month ago because of the persistent tachycardia she had had a CTA of the chest which was negative for PEs, CT without contrast done more recently has shown scarring and ongoing infiltrates consistent with COVID infection.  She reports if she had not been checking her blood pressure she otherwise would not feel any different.  They called their doctor who recommended that she come here today.  The history is provided by the patient and the spouse.  Hypertension      Past Medical History:  Diagnosis Date   Chronic kidney disease, stage 3a ([place]) 06/27/2021   COVID-19    GERD without esophagitis 06/27/2021   Mild intermittent asthma without complication  15/40/0867   Mixed hyperlipidemia 06/27/2021   Pneumonia     Patient Active Problem List   Diagnosis Date Noted   COVID-19 virus infection 06/27/2021   Acute respiratory failure with hypoxia ([place]) 06/27/2021   Chronic kidney disease, stage 3a ([place]) 06/27/2021   GERD without esophagitis 06/27/2021   Mixed hyperlipidemia 06/27/2021   Mild intermittent asthma with (acute) exacerbation 06/27/2021   Sepsis due to COVID-19 [facility]) 06/27/2021    Past Surgical History:  Procedure Laterality Date   TOTAL KNEE ARTHROPLASTY       OB History   No obstetric history on file.     Family History  Problem Relation Age of Onset   Heart disease Neg Hx     Social History   Tobacco Use   Smoking status: Never    Passive exposure: Never   Smokeless tobacco: Never  Vaping Use   Vaping Use: Never used  Substance Use Topics   Alcohol use: Never   Drug use: Never    Home Medications Prior to Admission medications   Medication Sig Start Date End Date Taking? Authorizing Provider  albuterol (VENTOLIN HFA) 108 (90 Base) MCG/ACT inhaler Inhale 2 puffs into the lungs every 4 (four) hours as needed for wheezing or shortness of breath. 07/04/21   Ghimire, Henreitta Leber, MD  Ascorbic Acid (VITAMIN C) 1000 MG tablet Take 1,000 mg by mouth every morning.    [provider]  aspirin 325 MG tablet  Take 325 mg by mouth daily.    [provider]  atorvastatin (LIPITOR) 10 MG tablet Take 5 mg by mouth See admin instructions. Take one tablet (10 mg) by mouth every other night 04/02/21   [provider]  benzonatate (TESSALON PERLES) 100 MG capsule Take 1 capsule (100 mg total) by mouth 3 (three) times daily as needed for cough. 07/04/21 07/04/22  Ghimire, Henreitta Leber, MD  budesonide-formoterol (SYMBICORT) 80-4.5 MCG/ACT inhaler Inhale 2 puffs into the lungs 2 (two) times daily. 07/04/21   Ghimire, Henreitta Leber, MD  buPROPion (WELLBUTRIN XL) 300 MG 24 hr tablet Take 300 mg by mouth daily.  04/02/21   [provider]  Cholecalciferol (VITAMIN D-3) 125 MCG (5000 UT) TABS Take 5,000 Units by mouth every morning.    [provider]  Cyanocobalamin (VITAMIN B-12 PO) Take 1 tablet by mouth every morning.    [provider]  DULoxetine (CYMBALTA) 30 MG capsule Take 30 mg by mouth daily. Patient not taking: No sig reported 06/22/21   [provider]  DULoxetine (CYMBALTA) 60 MG capsule Take 60 mg by mouth at bedtime.    [provider]  famotidine (PEPCID) 40 MG tablet Take 40 mg by mouth at bedtime.    [provider]  Fluticasone-Umeclidin-Vilant (TRELEGY ELLIPTA) 200-62.5-25 MCG/ACT AEPB Inhale 1 puff into the lungs daily. 07/20/21   Mannam, Hart Robinsons, MD  ipratropium-albuterol (DUONEB) 0.5-2.5 (3) MG/3ML SOLN Take 3 mLs by nebulization every 4 (four) hours as needed. 07/27/21   Mannam, Hart Robinsons, MD  Multiple Minerals-Vitamins (CALCIUM CITRATE-MAG-MINERALS) TABS Take 1 tablet by mouth every morning. Calcium Citrate plus magnesium and zinc    [provider]  [facility] powder Apply 1 application topically daily. Apply to legs after showering 06/19/21   [provider]  pantoprazole (PROTONIX) 40 MG tablet Take 40 mg by mouth every morning. 04/02/21   [provider]  predniSONE (DELTASONE) 10 MG tablet Take 4 tablets (40 mg) daily for 2 days, 3 tabs (30 mg) daily for 2 days, 2 tabs (20 mg) daily for 2 days, then 1 tab (10 mg) daily for 1 days, then stop 07/04/21   Jonetta Osgood, MD  raloxifene (EVISTA) 60 MG tablet Take 60 mg by mouth daily. 04/02/21   [provider]  zolpidem (AMBIEN) 10 MG tablet Take 10 mg by mouth at bedtime. 02/10/21   [provider]    Allergies    Doxycycline  Review of Systems   Review of Systems  All other systems reviewed and are negative.  Physical Exam Updated Vital Signs BP 116/81   Pulse (!) 131   Temp 99 F (37.2 C)   Resp 18   Ht 5' 4.5" (1.638 m)   Wt  72.6 kg   SpO2 93%   BMI 27.04 kg/m   Physical Exam Vitals and nursing note reviewed.  Constitutional:      General: She is not in acute distress.    Appearance: Normal appearance. She is well-developed.  HENT:     Head: Normocephalic and atraumatic.     Mouth/Throat:     Mouth: Mucous membranes are moist.  Eyes:     Conjunctiva/sclera: Conjunctivae normal.     Pupils: Pupils are equal, round, and reactive to light.     Funduscopic exam:    Right eye: No papilledema.        Left eye: No papilledema.  Cardiovascular:     Rate and Rhythm: Regular rhythm. Tachycardia present.  Pulses: Normal pulses.     Heart sounds: Normal heart sounds. No murmur heard.   No friction rub.  Pulmonary:     Effort: Pulmonary effort is normal. No respiratory distress.     Breath sounds: Normal breath sounds. No wheezing or rales.     Comments: Fine crackles noted in the left lower lobe base Abdominal:     General: Bowel sounds are normal. There is no distension.     Palpations: Abdomen is soft.     Tenderness: There is no abdominal tenderness. There is no guarding or rebound.  Musculoskeletal:        General: No tenderness. Normal range of motion.     Cervical back: Normal range of motion and neck supple.     Right lower leg: No edema.     Left lower leg: No edema.     Comments: No edema  Lymphadenopathy:     Cervical: No cervical adenopathy.  Skin:    General: Skin is warm and dry.     Findings: No erythema or rash.  Neurological:     Mental Status: She is alert and oriented to person, place, and time. Mental status is at baseline.     Cranial Nerves: No cranial nerve deficit.     Sensory: No sensory deficit.     Gait: Gait normal.     Comments: photophobia  Psychiatric:        Mood and Affect: Mood normal.        Behavior: Behavior normal.    ED Results / Procedures / Treatments   Labs (all labs ordered are listed, but only abnormal results are displayed) Labs Reviewed  TSH   T4    EKG None  Radiology No results found.  Procedures Procedures   Medications Ordered in ED Medications - No data to display  ED Course  I have reviewed the triage vital signs and the nursing notes.  Pertinent labs & imaging results that were available during my care of the patient were reviewed by me and considered in my medical decision making (see chart for details).    MDM Rules/Calculators/A&P                           Pleasant 75 year old female presenting today with hypertension.  Patient has been checking at home and her numbers have been running high without prior history of hypertension.  Patient had recently been hospitalized the end of October after having COVID and pneumonia.  She since being home she is still working with physical therapy, requiring oxygen sometimes during the day and at night and has had a persistent tachycardia that is mentioned throughout her hospital visit as well as outpatient evaluations.  Seems to be between 120-130.  Patient has no new complaints.  Blood pressure here initially was 116/81 however in the room when checked 2-3 times it was between 147-155/90 which is probably more accurate.  Patient in the last month has had CBC, BMP is done which show normal renal function and no significant anemia.  Looking back patient has not had thyroid studies done for over 6 months and with new recent illness and persistent tachycardia and hypertension concern for possible hyperthyroidism.  EKG shows sinus tachycardia but no other acute findings.  Since patient does not have any new symptoms and has been evaluated for the target tachycardia and negative for PE do not feel that she needs any further imaging at this time.  Reassured the patient and her husband.  Will need follow-up on the thyroid studies.  Also can call her doctor tomorrow they can follow-up on the labs and then decide if she should be started on a beta-blocker or be referred to  cardiology.   Final Clinical Impression(s) / ED Diagnoses Final diagnoses:  Tachycardia  Hypertension, unspecified type    Rx / DC Orders ED Discharge Orders     None        Blanchie Dessert, MD 08/14/21 1547

## 2021-08-14 NOTE — ED Triage Notes (Signed)
Pt c/o high blood pressure x 2 days. Pt denies any other symptoms.

## 2021-08-14 NOTE — Discharge Instructions (Addendum)
If you start having new chest pain, shortness of breath, passing out, any numbness or weakness on one side of your body you should return to the emergency room immediately.

## 2021-08-16 DIAGNOSIS — R6520 Severe sepsis without septic shock: Secondary | ICD-10-CM | POA: Diagnosis not present

## 2021-08-16 DIAGNOSIS — A419 Sepsis, unspecified organism: Secondary | ICD-10-CM | POA: Diagnosis not present

## 2021-08-16 DIAGNOSIS — J4531 Mild persistent asthma with (acute) exacerbation: Secondary | ICD-10-CM | POA: Diagnosis not present

## 2021-08-16 DIAGNOSIS — J1282 Pneumonia due to coronavirus disease 2019: Secondary | ICD-10-CM | POA: Diagnosis not present

## 2021-08-16 DIAGNOSIS — J9601 Acute respiratory failure with hypoxia: Secondary | ICD-10-CM | POA: Diagnosis not present

## 2021-08-16 DIAGNOSIS — U071 COVID-19: Secondary | ICD-10-CM | POA: Diagnosis not present

## 2021-08-16 LAB — T4: T4, Total: 6.4 ug/dL (ref 4.5–12.0)

## 2021-08-23 ENCOUNTER — Emergency Department (HOSPITAL_BASED_OUTPATIENT_CLINIC_OR_DEPARTMENT_OTHER)
Admission: EM | Admit: 2021-08-23 | Discharge: 2021-08-23 | Disposition: A | Discharge status: Home / Self Care | Payer: Medicare Other | Attending: Emergency Medicine | Admitting: Emergency Medicine

## 2021-08-23 ENCOUNTER — Other Ambulatory Visit: Payer: Self-pay

## 2021-08-23 ENCOUNTER — Encounter (HOSPITAL_BASED_OUTPATIENT_CLINIC_OR_DEPARTMENT_OTHER): Payer: Self-pay | Admitting: *Deleted

## 2021-08-23 DIAGNOSIS — Z8616 Personal history of COVID-19: Secondary | ICD-10-CM | POA: Insufficient documentation

## 2021-08-23 DIAGNOSIS — N1831 Chronic kidney disease, stage 3a: Secondary | ICD-10-CM | POA: Insufficient documentation

## 2021-08-23 DIAGNOSIS — Z96659 Presence of unspecified artificial knee joint: Secondary | ICD-10-CM | POA: Insufficient documentation

## 2021-08-23 DIAGNOSIS — Z4802 Encounter for removal of sutures: Secondary | ICD-10-CM | POA: Diagnosis not present

## 2021-08-23 DIAGNOSIS — J4520 Mild intermittent asthma, uncomplicated: Secondary | ICD-10-CM | POA: Diagnosis not present

## 2021-08-23 DIAGNOSIS — Z7951 Long term (current) use of inhaled steroids: Secondary | ICD-10-CM | POA: Diagnosis not present

## 2021-08-23 DIAGNOSIS — Z7982 Long term (current) use of aspirin: Secondary | ICD-10-CM | POA: Insufficient documentation

## 2021-08-23 DIAGNOSIS — J1282 Pneumonia due to coronavirus disease 2019: Secondary | ICD-10-CM | POA: Diagnosis not present

## 2021-08-23 DIAGNOSIS — A419 Sepsis, unspecified organism: Secondary | ICD-10-CM | POA: Diagnosis not present

## 2021-08-23 DIAGNOSIS — J4531 Mild persistent asthma with (acute) exacerbation: Secondary | ICD-10-CM | POA: Diagnosis not present

## 2021-08-23 DIAGNOSIS — J9601 Acute respiratory failure with hypoxia: Secondary | ICD-10-CM | POA: Diagnosis not present

## 2021-08-23 DIAGNOSIS — Z79899 Other long term (current) drug therapy: Secondary | ICD-10-CM | POA: Diagnosis not present

## 2021-08-23 DIAGNOSIS — R6520 Severe sepsis without septic shock: Secondary | ICD-10-CM | POA: Diagnosis not present

## 2021-08-23 DIAGNOSIS — S0101XD Laceration without foreign body of scalp, subsequent encounter: Secondary | ICD-10-CM | POA: Diagnosis not present

## 2021-08-23 DIAGNOSIS — U071 COVID-19: Secondary | ICD-10-CM | POA: Diagnosis not present

## 2021-08-23 NOTE — ED Notes (Signed)
AVS provided to patient. Pt verbalizes understanding of discharge instructions and denies any questions or concerns at this time. Pt waiting in spouse's room on him to be discharged. Pt ambulatory.

## 2021-08-23 NOTE — ED Triage Notes (Signed)
Pt fell on 11/25 one staple applied to head, Ready for removal.

## 2021-08-23 NOTE — ED Provider Notes (Signed)
[facility] Provider Note  CSN: [id number] Arrival date & time: 08/23/21 1526    History Chief Complaint  Patient presents with   Suture / Staple Removal    Tracy Young is a 75 y.o. female with recent ED visit for head injury, had a scalp laceration stapled and is here for staple removal. No conecerns regarding wound healing.    Past Medical History:  Diagnosis Date   Chronic kidney disease, stage 3a ([place]) 06/27/2021   COVID-19    GERD without esophagitis 06/27/2021   Mild intermittent asthma without complication 40/98/1191   Mixed hyperlipidemia 06/27/2021   Pneumonia     Past Surgical History:  Procedure Laterality Date   TOTAL KNEE ARTHROPLASTY      Family History  Problem Relation Age of Onset   Heart disease Neg Hx     Social History   Tobacco Use   Smoking status: Never    Passive exposure: Never   Smokeless tobacco: Never  Vaping Use   Vaping Use: Never used  Substance Use Topics   Alcohol use: Never   Drug use: Never     Home Medications Prior to Admission medications   Medication Sig Start Date End Date Taking? Authorizing Provider  albuterol (VENTOLIN HFA) 108 (90 Base) MCG/ACT inhaler Inhale 2 puffs into the lungs every 4 (four) hours as needed for wheezing or shortness of breath. 07/04/21   Ghimire, Henreitta Leber, MD  Ascorbic Acid (VITAMIN C) 1000 MG tablet Take 1,000 mg by mouth every morning.    [provider]  aspirin 325 MG tablet Take 325 mg by mouth daily.    [provider]  atorvastatin (LIPITOR) 10 MG tablet Take 5 mg by mouth See admin instructions. Take one tablet (10 mg) by mouth every other night 04/02/21   [provider]  benzonatate (TESSALON PERLES) 100 MG capsule Take 1 capsule (100 mg total) by mouth 3 (three) times daily as needed for cough. 07/04/21 07/04/22  Ghimire, Henreitta Leber, MD  budesonide-formoterol (SYMBICORT) 80-4.5 MCG/ACT inhaler Inhale 2 puffs into the lungs  2 (two) times daily. 07/04/21   Ghimire, Henreitta Leber, MD  buPROPion (WELLBUTRIN XL) 300 MG 24 hr tablet Take 300 mg by mouth daily. 04/02/21   [provider]  Cholecalciferol (VITAMIN D-3) 125 MCG (5000 UT) TABS Take 5,000 Units by mouth every morning.    [provider]  Cyanocobalamin (VITAMIN B-12 PO) Take 1 tablet by mouth every morning.    [provider]  DULoxetine (CYMBALTA) 30 MG capsule Take 30 mg by mouth daily. Patient not taking: No sig reported 06/22/21   [provider]  DULoxetine (CYMBALTA) 60 MG capsule Take 60 mg by mouth at bedtime.    [provider]  famotidine (PEPCID) 40 MG tablet Take 40 mg by mouth at bedtime.    [provider]  Fluticasone-Umeclidin-Vilant (TRELEGY ELLIPTA) 200-62.5-25 MCG/ACT AEPB Inhale 1 puff into the lungs daily. 07/20/21   Mannam, Hart Robinsons, MD  ipratropium-albuterol (DUONEB) 0.5-2.5 (3) MG/3ML SOLN Take 3 mLs by nebulization every 4 (four) hours as needed. 07/27/21   Mannam, Hart Robinsons, MD  Multiple Minerals-Vitamins (CALCIUM CITRATE-MAG-MINERALS) TABS Take 1 tablet by mouth every morning. Calcium Citrate plus magnesium and zinc    [provider]  [facility] powder Apply 1 application topically daily. Apply to legs after showering 06/19/21   [provider]  pantoprazole (PROTONIX) 40 MG tablet Take 40 mg by mouth every morning. 04/02/21   [provider]  predniSONE (DELTASONE) 10 MG tablet Take 4 tablets (40 mg) daily for 2 days, 3 tabs (30 mg) daily for 2 days, 2 tabs (20 mg) daily for 2 days, then 1 tab (10 mg) daily for 1 days, then stop 07/04/21   Jonetta Osgood, MD  raloxifene (EVISTA) 60 MG tablet Take 60 mg by mouth daily. 04/02/21   [provider]  zolpidem (AMBIEN) 10 MG tablet Take 10 mg by mouth at bedtime. 02/10/21   [provider]     Allergies    Doxycycline   Review of Systems   Review of Systems A comprehensive review of systems was  completed and negative except as noted in HPI.    Physical Exam BP (!) 154/98 (BP Location: Right Arm)   Pulse (!) 119   Temp 98.4 F (36.9 C)   Resp 18   Ht 5' 4.5" (1.638 m)   Wt 71.2 kg   SpO2 96%   BMI 26.53 kg/m   Physical Exam Vitals and nursing note reviewed.  HENT:     Head: Normocephalic.     Comments: Healed wound L parietal scalp    Nose: Nose normal.  Eyes:     Extraocular Movements: Extraocular movements intact.  Pulmonary:     Effort: Pulmonary effort is normal.  Musculoskeletal:        General: Normal range of motion.     Cervical back: Neck supple.  Skin:    Findings: No rash (on exposed skin).  Neurological:     Mental Status: She is alert and oriented to person, place, and time.  Psychiatric:        Mood and Affect: Mood normal.     ED Results / Procedures / Treatments   Labs (all labs ordered are listed, but only abnormal results are displayed) Labs Reviewed - No data to display  EKG None   Radiology No results found.  Procedures .Suture Removal  Date/Time: 08/23/2021 4:06 PM Performed by: Truddie Hidden, MD Authorized by: Truddie Hidden, MD   Consent:    Consent obtained:  Verbal Procedure details:    Wound appearance:  No signs of infection   Number of staples removed:  1 Post-procedure details:    Procedure completion:  Tolerated well, no immediate complications  Medications Ordered in the ED Medications - No data to display   MDM Rules/Calculators/A&P MDM   ED Course  I have reviewed the triage vital signs and the nursing notes.  Pertinent labs & imaging results that were available during my care of the patient were reviewed by me and considered in my medical decision making (see chart for details).     Final Clinical Impression(s) / ED Diagnoses Final diagnoses:  Removal of staple    Rx / DC Orders ED Discharge Orders     None        Truddie Hidden, MD 08/23/21 [phone]

## 2021-08-24 NOTE — Progress Notes (Signed)
Cardiology Office Note:   Date:  08/25/2021  NAME:  Tracy Young    MRN: [id number] DOB:  09/13/1946   PCP:  Maury Dus, MD  Cardiologist:  None  Electrophysiologist:  None   Referring MD: Maury Dus, MD   Chief Complaint  Patient presents with   Coronary Artery Disease         History of Present Illness:   Tracy Young is a 75 y.o. female with a hx of asthma/ILD, HLD who is being seen today for the evaluation of coronary calcifications at the request of Maury Dus, MD. 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 Coronary calcifications seen on chest CT.  She is a wife of Mr. Brasil who is a patient of mine who underwent bypass surgery.  She apparently was diagnosed with COVID-19 pneumonia in October of this year.  She has now developed what appears to be interstitial lung disease that is being treated by pulmonary.  CT imaging demonstrated coronary calcifications in the LAD and RCA.  She reports no chest pressure or chest tightness.  She does have COVID interstitial lung disease and is getting over this.  She is short of breath with activity.  No echo has been performed that I can see.  We discussed obtaining 1 of these.  EKG demonstrates sinus tachycardia with no acute ischemic changes or evidence of infarction.  She does report that her father had heart disease.  She is also noticed that her blood pressure has been high.  Systolic values have ranged between 140 and 160.  This is quite concerning.  She also has remained on 325 aspirin since her discharge.  She had no evidence of pulmonary embolism.  We discussed switching to 81 mg of aspirin daily.  She does take cholesterol medication but takes it every other day.  Most recent LDL cholesterol 72 and triglycerides 270.  She also has sinus tachycardia.  I suspect this is secondary to her interstitial lung disease and shortness of breath.  Most recent TSH 1.9.  She does not smoke.  She drinks alcohol in moderation.  No drug use  is reported.  She is not anemic with a hemoglobin 11.6.  She is retired Press photographer for [employer].  She has 1 son.  Mr. Thorson has 3 children.  They have several grandchildren.  Problem List Asthma/ILD Coronary calcifications on Chest CT -mild LAD/RCA 3. HLD -T chol 169, HDL 54, LDL 72, TG 270 4. HTN  Past Medical History: Past Medical History:  Diagnosis Date   Chronic kidney disease, stage 3a ([place]) 06/27/2021   COVID-19    GERD without esophagitis 06/27/2021   Hypertension    Mild intermittent asthma without complication 54/56/2563   Mixed hyperlipidemia 06/27/2021   Pneumonia     Past Surgical History: Past Surgical History:  Procedure Laterality Date   TOTAL KNEE ARTHROPLASTY      Current Medications: Current Meds  Medication Sig   albuterol (VENTOLIN HFA) 108 (90 Base) MCG/ACT inhaler Inhale 2 puffs into the lungs every 4 (four) hours as needed for wheezing or shortness of breath.   amLODipine (NORVASC) 5 MG tablet Take 1 tablet (5 mg total) by mouth daily.   Ascorbic Acid (VITAMIN C) 1000 MG tablet Take 1,000 mg by mouth every morning.   aspirin EC 81 MG tablet Take 1 tablet (81 mg total) by mouth daily. Swallow whole.   benzonatate (TESSALON PERLES) 100 MG capsule Take 1 capsule (100 mg total) by mouth 3 (  three) times daily as needed for cough.   budesonide-formoterol (SYMBICORT) 80-4.5 MCG/ACT inhaler Inhale 2 puffs into the lungs 2 (two) times daily.   buPROPion (WELLBUTRIN XL) 300 MG 24 hr tablet Take 300 mg by mouth daily.   Cholecalciferol (VITAMIN D-3) 125 MCG (5000 UT) TABS Take 5,000 Units by mouth every morning.   Cyanocobalamin (VITAMIN B-12 PO) Take 1 tablet by mouth every morning.   DULoxetine (CYMBALTA) 60 MG capsule Take 60 mg by mouth at bedtime.   famotidine (PEPCID) 40 MG tablet Take 40 mg by mouth at bedtime.   Fluticasone-Umeclidin-Vilant (TRELEGY ELLIPTA) 200-62.5-25 MCG/ACT AEPB Inhale 1 puff into the lungs daily.   ipratropium-albuterol  (DUONEB) 0.5-2.5 (3) MG/3ML SOLN Take 3 mLs by nebulization every 4 (four) hours as needed.   Multiple Minerals-Vitamins (CALCIUM CITRATE-MAG-MINERALS) TABS Take 1 tablet by mouth every morning. Calcium Citrate plus magnesium and zinc   NYAMYC powder Apply 1 application topically daily. Apply to legs after showering   pantoprazole (PROTONIX) 40 MG tablet Take 40 mg by mouth every morning.   raloxifene (EVISTA) 60 MG tablet Take 60 mg by mouth daily.   [DISCONTINUED] aspirin 325 MG tablet Take 325 mg by mouth daily.   [DISCONTINUED] atorvastatin (LIPITOR) 10 MG tablet Take 5 mg by mouth See admin instructions. Take one tablet (10 mg) by mouth every other night     Allergies:    Doxycycline   Social History: Social History   Socioeconomic History   Marital status: Married    Spouse name: Not on file   Number of children: 4   Years of education: Not on file   Highest education level: Not on file  Occupational History   Occupation: Retired Press photographer  Tobacco Use   Smoking status: Never    Passive exposure: Never   Smokeless tobacco: Never  Vaping Use   Vaping Use: Never used  Substance and Sexual Activity   Alcohol use: Never   Drug use: Never   Sexual activity: Not on file  Other Topics Concern   Not on file  Social History Narrative   Not on file   Social Determinants of Health   Financial Resource Strain: Not on file  Food Insecurity: Not on file  Transportation Needs: Not on file  Physical Activity: Not on file  Stress: Not on file  Social Connections: Not on file     Family History: The patient's family history includes Heart attack in her father. There is no history of Heart disease.  ROS:   All other ROS reviewed and negative. Pertinent positives noted in the HPI.     EKGs/Labs/Other Studies Reviewed:   The following studies were personally reviewed by me today:  EKG:  EKG is ordered today.  The ekg ordered today demonstrates sinus tachycardia heart rate  121, no acute ischemic changes or evidence of infarction, and was personally reviewed by me.   Recent Labs: 06/27/2021: B Natriuretic Peptide 26.8 07/02/2021: ALT 41; Magnesium 2.2 07/03/2021: BUN 17; Creatinine, Ser 0.73; Hemoglobin 11.6; Platelets 307; Potassium 4.2; Sodium 134 08/14/2021: TSH 1.916   Recent Lipid Panel No results found for: CHOL, TRIG, HDL, CHOLHDL, VLDL, LDLCALC, LDLDIRECT  Physical Exam:   VS:  BP 140/82   Pulse (!) 120   Ht 5' 4.5" (1.638 m)   Wt 167 lb 3.2 oz (75.8 kg)   SpO2 96%   BMI 28.26 kg/m    Wt Readings from Last 3 Encounters:  08/25/21 167 lb 3.2 oz (75.8 kg)  08/23/21 157 lb (71.2 kg)  08/14/21 160 lb (72.6 kg)    General: Well nourished, well developed, in no acute distress Head: Atraumatic, normal size  Eyes: PEERLA, EOMI  Neck: Supple, no JVD Endocrine: No thryomegaly Cardiac: Normal S1, S2; tachycardia noted, no murmurs Lungs: Clear to auscultation bilaterally, no wheezing, rhonchi or rales  Abd: Soft, nontender, no hepatomegaly  Ext: No edema, pulses 2+ Musculoskeletal: No deformities, BUE and BLE strength normal and equal Skin: Warm and dry, no rashes   Neuro: Alert and oriented to person, place, time, and situation, CNII-XII grossly intact, no focal deficits  Psych: Normal mood and affect   ASSESSMENT:   Tracy Young is a 75 y.o. female who presents for the following: 1. Coronary artery calcification seen on computed tomography   2. Mixed hyperlipidemia   3. SOB (shortness of breath)   4. Primary hypertension     PLAN:   1. Coronary artery calcification seen on computed tomography 2. Mixed hyperlipidemia -Coronary calcification seen on recent chest CT imaging.  EKG demonstrates no acute ischemic changes or evidence of infarction.  She describes no exertional chest pain or pressure.  She is short of breath with exertion but has been recently diagnosed with interstitial lung disease after COVID-19 infection.  I believe this is  the etiology of her shortness of breath.  I do not believe she has underlying obstructive CAD. -Would recommend she reduce aspirin to 81 mg daily. -Would recommend she take Lipitor 10 mg daily.  She was taking it every other day and her LDL cholesterol was 72.  We will plan to recheck her lipid profile when I see her back in 4 months.  3. SOB (shortness of breath) -Shortness of breath is related to interstitial lung disease.  I would like to check an echocardiogram just to make sure she has no evidence of increased pulmonary pressures.  Clearly this is a big risk in the setting of interstitial lung disease.  She has no evidence of congestive heart failure today.  This is reassuring.  I see no need for a stress test at this time.  I think we have a clear etiology for her shortness of breath.  The echocardiogram will complete her work-up.  4. Primary hypertension -Blood pressure is elevated.  Has been elevated since a fall several months ago.  Would start Norvasc 5 mg daily.  We will keep a close eye on this.     Disposition: Return in about 4 months (around 12/24/2021).  Medication Adjustments/Labs and Tests Ordered: Current medicines are reviewed at length with the patient today.  Concerns regarding medicines are outlined above.  Orders Placed This Encounter  Procedures   Lipid panel   EKG 12-Lead   ECHOCARDIOGRAM COMPLETE    Meds ordered this encounter  Medications   amLODipine (NORVASC) 5 MG tablet    Sig: Take 1 tablet (5 mg total) by mouth daily.    Dispense:  180 tablet    Refill:  3   DISCONTD: atorvastatin (LIPITOR) 10 MG tablet    Sig: Take 0.5 tablets (5 mg total) by mouth daily. Take one tablet (10 mg) by mouth every other night    Dispense:  60 tablet    Refill:  1   aspirin EC 81 MG tablet    Sig: Take 1 tablet (81 mg total) by mouth daily. Swallow whole.    Dispense:  90 tablet    Refill:  3   atorvastatin (LIPITOR) 10 MG tablet  Sig: Take 1 tablet (10 mg total) by  mouth daily. Take one tablet (10 mg) by mouth every other night    Dispense:  90 tablet    Refill:  1     Patient Instructions  Medication Instructions:  Start taking LIPITOR daily START Amlodipine 5 mg daily  TAKE Aspirin 81 mg daily   *If you need a refill on your cardiac medications before your next appointment, please call your pharmacy*   Lab Work: LIPID (1 week before 4 month follow up, no lab appointment needed, come fasting- nothing to eat or drink)   If you have labs (blood work) drawn today and your tests are completely normal, you will receive your results only by: [place] (if you have MyChart) OR A paper copy in the mail If you have any lab test that is abnormal or we need to change your treatment, we will call you to review the results.   Testing/Procedures: Echocardiogram - Your physician has requested that you have an echocardiogram. Echocardiography is a painless test that uses sound waves to create images of your heart. It provides your doctor with information about the size and shape of your heart and how well your heart's chambers and valves are working. This procedure takes approximately one hour. There are no restrictions for this procedure. This will be performed at either our [facility] location - [address] [place] location [facility] 2nd floor.    Follow-Up: At [facility], you and your health needs are our priority.  As part of our continuing mission to provide you with exceptional heart care, we have created designated Provider Care Teams.  These Care Teams include your primary Cardiologist (physician) and Advanced Practice Providers (APPs -  Physician Assistants and Nurse Practitioners) who all work together to provide you with the care you need, when you need it.  We recommend signing up for the patient portal called "MyChart".  Sign up information is provided on this After Visit Summary.  MyChart is used  to connect with patients for Virtual Visits (Telemedicine).  Patients are able to view lab/test results, encounter notes, upcoming appointments, etc.  Non-urgent messages can be sent to your provider as well.   To learn more about what you can do with MyChart, go to [email or web address].    Your next appointment:   4 month(s)  The format for your next appointment:   In Person  Provider:   Eleonore Chiquito, MD   Signed, Addison Naegeli. Audie Box, MD, [place]  [address], [place], [place] [address] [phone]  08/25/2021 4:42 PM

## 2021-08-25 ENCOUNTER — Encounter: Payer: Self-pay | Admitting: Cardiovascular Disease

## 2021-08-25 ENCOUNTER — Other Ambulatory Visit: Payer: Self-pay

## 2021-08-25 ENCOUNTER — Ambulatory Visit (INDEPENDENT_AMBULATORY_CARE_PROVIDER_SITE_OTHER): Payer: Medicare Other | Admitting: Cardiovascular Disease

## 2021-08-25 VITALS — BP 140/82 | HR 120 | Ht 64.5 in | Wt 167.2 lb

## 2021-08-25 DIAGNOSIS — I2510 Atherosclerotic heart disease of native coronary artery without angina pectoris: Secondary | ICD-10-CM

## 2021-08-25 DIAGNOSIS — R0602 Shortness of breath: Secondary | ICD-10-CM

## 2021-08-25 DIAGNOSIS — I10 Essential (primary) hypertension: Secondary | ICD-10-CM | POA: Diagnosis not present

## 2021-08-25 DIAGNOSIS — E782 Mixed hyperlipidemia: Secondary | ICD-10-CM | POA: Diagnosis not present

## 2021-08-25 MED ORDER — ASPIRIN EC 81 MG PO TBEC: 81.0000 mg | DELAYED_RELEASE_TABLET | Freq: Every day | ORAL | 3 refills | Status: DC

## 2021-08-25 MED ORDER — ATORVASTATIN CALCIUM 10 MG PO TABS: 10.0000 mg | ORAL_TABLET | Freq: Every day | ORAL | 1 refills | Status: DC

## 2021-08-25 MED ORDER — ATORVASTATIN CALCIUM 10 MG PO TABS: 5.0000 mg | ORAL_TABLET | Freq: Every day | ORAL | 1 refills | Status: DC

## 2021-08-25 MED ORDER — AMLODIPINE BESYLATE 5 MG PO TABS: 5.0000 mg | ORAL_TABLET | Freq: Every day | ORAL | 3 refills | Status: DC

## 2021-08-25 NOTE — Patient Instructions (Signed)
Medication Instructions:  Start taking LIPITOR daily START Amlodipine 5 mg daily  TAKE Aspirin 81 mg daily   *If you need a refill on your cardiac medications before your next appointment, please call your pharmacy*   Lab Work: LIPID (1 week before 4 month follow up, no lab appointment needed, come fasting- nothing to eat or drink)   If you have labs (blood work) drawn today and your tests are completely normal, you will receive your results only by: [place] (if you have MyChart) OR A paper copy in the mail If you have any lab test that is abnormal or we need to change your treatment, we will call you to review the results.   Testing/Procedures: Echocardiogram - Your physician has requested that you have an echocardiogram. Echocardiography is a painless test that uses sound waves to create images of your heart. It provides your doctor with information about the size and shape of your heart and how well your heart's chambers and valves are working. This procedure takes approximately one hour. There are no restrictions for this procedure. This will be performed at either our [facility] location - [address] [place] location [facility] 2nd floor.    Follow-Up: At [facility], you and your health needs are our priority.  As part of our continuing mission to provide you with exceptional heart care, we have created designated Provider Care Teams.  These Care Teams include your primary Cardiologist (physician) and Advanced Practice Providers (APPs -  Physician Assistants and Nurse Practitioners) who all work together to provide you with the care you need, when you need it.  We recommend signing up for the patient portal called "MyChart".  Sign up information is provided on this After Visit Summary.  MyChart is used to connect with patients for Virtual Visits (Telemedicine).  Patients are able to view lab/test results, encounter notes, upcoming  appointments, etc.  Non-urgent messages can be sent to your provider as well.   To learn more about what you can do with MyChart, go to [email or web address].    Your next appointment:   4 month(s)  The format for your next appointment:   In Person  Provider:   Eleonore Chiquito, MD

## 2021-08-26 ENCOUNTER — Other Ambulatory Visit: Payer: Self-pay

## 2021-08-26 MED ORDER — ATORVASTATIN CALCIUM 10 MG PO TABS: 10.0000 mg | ORAL_TABLET | Freq: Every day | ORAL | 1 refills | Status: DC

## 2021-09-13 ENCOUNTER — Ambulatory Visit (INDEPENDENT_AMBULATORY_CARE_PROVIDER_SITE_OTHER): Payer: Medicare Other

## 2021-09-13 ENCOUNTER — Other Ambulatory Visit: Payer: Self-pay

## 2021-09-13 DIAGNOSIS — R0602 Shortness of breath: Secondary | ICD-10-CM | POA: Diagnosis not present

## 2021-09-13 LAB — ECHOCARDIOGRAM COMPLETE
AR max vel: 2.26 cm2
AV Area VTI: 1.84 cm2
AV Area mean vel: 1.89 cm2
AV Mean grad: 4 mmHg
AV Peak grad: 6.1 mmHg
Ao pk vel: 1.23 m/s
Area-P 1/2: 6.37 cm2
Calc EF: 62.3 %
MV VTI: 2.84 cm2
S' Lateral: 2.6 cm
Single Plane A2C EF: 63.8 %
Single Plane A4C EF: 60.4 %

## 2021-09-28 ENCOUNTER — Ambulatory Visit (INDEPENDENT_AMBULATORY_CARE_PROVIDER_SITE_OTHER): Payer: Medicare Other | Admitting: Pulmonary Disease

## 2021-09-28 ENCOUNTER — Other Ambulatory Visit: Payer: Self-pay

## 2021-09-28 ENCOUNTER — Encounter: Payer: Self-pay | Admitting: Pulmonary Disease

## 2021-09-28 VITALS — BP 150/84 | HR 120 | Temp 98.3°F | Ht 64.5 in | Wt 164.8 lb

## 2021-09-28 DIAGNOSIS — U099 Post covid-19 condition, unspecified: Secondary | ICD-10-CM

## 2021-09-28 DIAGNOSIS — J849 Interstitial pulmonary disease, unspecified: Secondary | ICD-10-CM

## 2021-09-28 LAB — PULMONARY FUNCTION TEST
DL/VA % pred: 81 %
DL/VA: 3.36 ml/min/mmHg/L
DLCO cor % pred: 43 %
DLCO cor: 8.61 ml/min/mmHg
DLCO unc % pred: 43 %
DLCO unc: 8.61 ml/min/mmHg
FEF 25-75 Post: 3.69 L/sec
FEF 25-75 Pre: 3.7 L/sec
FEF2575-%Change-Post: 0 %
FEF2575-%Pred-Post: 206 %
FEF2575-%Pred-Pre: 207 %
FEV1-%Change-Post: -3 %
FEV1-%Pred-Post: 85 %
FEV1-%Pred-Pre: 88 %
FEV1-Post: 1.9 L
FEV1-Pre: 1.97 L
FEV1FVC-%Change-Post: -2 %
FEV1FVC-%Pred-Pre: 123 %
FEV6-%Change-Post: 0 %
FEV6-%Pred-Post: 74 %
FEV6-%Pred-Pre: 73 %
FEV6-Post: 2.09 L
FEV6-Pre: 2.08 L
FEV6FVC-%Pred-Post: 104 %
FEV6FVC-%Pred-Pre: 104 %
FVC-%Change-Post: -1 %
FVC-%Pred-Post: 70 %
FVC-%Pred-Pre: 72 %
FVC-Post: 2.09 L
FVC-Pre: 2.13 L
Post FEV1/FVC ratio: 91 %
Post FEV6/FVC ratio: 100 %
Pre FEV1/FVC ratio: 93 %
Pre FEV6/FVC Ratio: 100 %
RV % pred: 53 %
RV: 1.21 L
TLC % pred: 63 %
TLC: 3.27 L

## 2021-09-28 MED ORDER — TRELEGY ELLIPTA 200-62.5-25 MCG/ACT IN AEPB: 1.0000 | INHALATION_SPRAY | Freq: Every day | RESPIRATORY_TRACT | 2 refills | Status: DC

## 2021-09-28 NOTE — Addendum Note (Signed)
Addended by: Elton Sin on: 09/28/2021 04:48 PM   Modules accepted: Orders

## 2021-09-28 NOTE — Progress Notes (Signed)
PFT done today. 

## 2021-09-28 NOTE — Progress Notes (Signed)
Tracy Young    [id number]    09/09/1946  Primary Care Physician:Reade, Herbie [place], MD  Referring Physician: Maury Dus, MD [address] 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  Required high flow nasal cannula.  She was treated with steroids, baricitinib and gradually weaned down to 6 L oxygen on discharge.  She also got 10 days of prednisone on discharge Hospital course noted for elevated D-dimer with negative CT angiogram and lower extremity Dopplers for clot.  She was advised to use daily aspirin at discharge  Post discharge she is improved to 2 L oxygen but continues to have significant dyspnea on exertion, chronic cough which is nonproductive in nature.  No fevers or chills  Currently on Symbicort inhaler which she is using twice daily  Pets: No pets Occupation: Retired Optometrist Exposures: No exposures. Smoking history: Non-smoker Travel history: No significant travel history Relevant family history: No family history of lung disease  Interim history: Continues to make improvements with regard to breathing.  She is weaned herself off oxygen.  Her sats remained above 94% on exertion at home  Outpatient Encounter Medications as of 09/28/2021  Medication Sig   albuterol (VENTOLIN HFA) 108 (90 Base) MCG/ACT inhaler Inhale 2 puffs into the lungs every 4 (four) hours as needed for wheezing or shortness of breath.   amLODipine (NORVASC) 5 MG tablet Take 1 tablet (5 mg total) by mouth daily.   Ascorbic Acid (VITAMIN C) 1000 MG tablet Take 1,000 mg by mouth every morning.   aspirin EC 81 MG tablet Take 1 tablet (81 mg total) by mouth daily. Swallow whole.   atorvastatin (LIPITOR) 10 MG tablet Take 1 tablet (10 mg total) by mouth daily.   benzonatate (TESSALON PERLES) 100 MG  capsule Take 1 capsule (100 mg total) by mouth 3 (three) times daily as needed for cough.   buPROPion (WELLBUTRIN XL) 300 MG 24 hr tablet Take 300 mg by mouth daily.   Cholecalciferol (VITAMIN D-3) 125 MCG (5000 UT) TABS Take 5,000 Units by mouth every morning.   Cyanocobalamin (VITAMIN B-12 PO) Take 1 tablet by mouth every morning.   DULoxetine (CYMBALTA) 60 MG capsule Take 60 mg by mouth at bedtime.   famotidine (PEPCID) 40 MG tablet Take 40 mg by mouth at bedtime.   Fluticasone-Umeclidin-Vilant (TRELEGY ELLIPTA) 200-62.5-25 MCG/ACT AEPB Inhale 1 puff into the lungs daily.   ipratropium-albuterol (DUONEB) 0.5-2.5 (3) MG/3ML SOLN Take 3 mLs by nebulization every 4 (four) hours as needed.   Multiple Minerals-Vitamins (CALCIUM CITRATE-MAG-MINERALS) TABS Take 1 tablet by mouth every morning. Calcium Citrate plus magnesium and zinc   NYAMYC powder Apply 1 application topically daily. Apply to legs after showering   pantoprazole (PROTONIX) 40 MG tablet Take 40 mg by mouth every morning.   raloxifene (EVISTA) 60 MG tablet Take 60 mg by mouth daily.   budesonide-formoterol (SYMBICORT) 80-4.5 MCG/ACT inhaler Inhale 2 puffs into the lungs 2 (two) times daily.   No facility-administered encounter medications on file as of 09/28/2021.    Physical Exam: Blood pressure (!) 150/84, pulse (!) 120, temperature 98.3 F (36.8 C), temperature source Oral, height 5' 4.5" (1.638 m), weight 164 lb 12.8 oz (74.8 kg), SpO2 93 %. Gen:      No acute distress HEENT:  EOMI, sclera anicteric  Neck:     No masses; no thyromegaly Lungs:    Clear to auscultation bilaterally; normal respiratory effort CV:         Regular rate and rhythm; no murmurs Abd:      + bowel sounds; soft, non-tender; no palpable masses, no distension Ext:    No edema; adequate peripheral perfusion Skin:      Warm and dry; no rash Neuro: alert and oriented x 3 Psych: normal mood and affect   Data Reviewed: Imaging: CTA 06/28/2021-no pulmonary  embolism, bilateral confluent groundglass opacities CTA 07/03/2021-no pulmonary embolism, bilateral groundglass opacities with mild improvement High-resolution CT 08/08/2021-evolving interstitial lung disease and probable UIP pattern I have reviewed the images personally.  PFTs: 09/28/2021 FVC 2.09 [90%], FEV1 1.90 [85%], F/F 91, TLC 3.27 [63%], DLCO 8.61 [43%] Moderate restriction, severe diffusion defect  Labs:   Cardiac: Echocardiogram 09/13/2021 LVEF 65-70%, normal PA systolic pressure, RV size and function is normal.   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  DuoNebs as needed Off supplemental oxygen. She is still waiting to hear from pulmonary rehab.  I think she will benefit from getting on exercise therapy at [facility] well in the [place] reviewed with evolving post-COVID fibrosis.  Order follow-up CT and PFTs in 1 year  Sinus tachycardia, hypertension Follows with cardiology.  Recently started on Norvasc Echo reviewed with normal function  Plan/Recommendations: Continue trelegy Exercise therapy  Marshell Garfinkel MD [facility] 09/28/2021, 1:57 PM  CC: Maury Dus, MD

## 2021-09-28 NOTE — Patient Instructions (Addendum)
Can we give her a referral to [facility] so that he can get on an exercise program I am glad that you are continuing to improve We will order high-res CT and PFTs in 1 year for follow-up Follow-up in clinic in 6 months

## 2021-10-06 DIAGNOSIS — N390 Urinary tract infection, site not specified: Secondary | ICD-10-CM | POA: Diagnosis not present

## 2021-10-06 DIAGNOSIS — J0140 Acute pansinusitis, unspecified: Secondary | ICD-10-CM | POA: Diagnosis not present

## 2021-10-10 ENCOUNTER — Telehealth (HOSPITAL_COMMUNITY): Payer: Self-pay

## 2021-10-10 NOTE — Telephone Encounter (Signed)
Pt is not interested in pulmonary rehab at this time. Closed referral.

## 2021-10-13 DIAGNOSIS — R269 Unspecified abnormalities of gait and mobility: Secondary | ICD-10-CM | POA: Diagnosis not present

## 2021-10-13 DIAGNOSIS — Z79899 Other long term (current) drug therapy: Secondary | ICD-10-CM | POA: Diagnosis not present

## 2021-10-13 DIAGNOSIS — U099 Post covid-19 condition, unspecified: Secondary | ICD-10-CM | POA: Diagnosis not present

## 2021-10-13 DIAGNOSIS — M545 Low back pain, unspecified: Secondary | ICD-10-CM | POA: Diagnosis not present

## 2021-10-19 ENCOUNTER — Telehealth: Payer: Self-pay | Admitting: Pulmonary Disease

## 2021-10-19 DIAGNOSIS — U099 Post covid-19 condition, unspecified: Secondary | ICD-10-CM

## 2021-10-19 NOTE — Telephone Encounter (Signed)
Called and spoke with patient and her husband. They stated that [facility] did receive the referral that was placed back on 09/28/21 but it was sent to the fitness center instead of the physical therapy dept. Per patient, the order has been sent directly to physical therapy as the fitness center will not forward the referral to them.   I will replace the order as a amb referral to physical therapy.   Nothing further needed at time of call.

## 2021-10-26 DIAGNOSIS — N1831 Chronic kidney disease, stage 3a: Secondary | ICD-10-CM | POA: Diagnosis not present

## 2021-10-26 DIAGNOSIS — F331 Major depressive disorder, recurrent, moderate: Secondary | ICD-10-CM | POA: Diagnosis not present

## 2021-10-26 DIAGNOSIS — E782 Mixed hyperlipidemia: Secondary | ICD-10-CM | POA: Diagnosis not present

## 2021-10-26 DIAGNOSIS — G4700 Insomnia, unspecified: Secondary | ICD-10-CM | POA: Diagnosis not present

## 2021-11-01 ENCOUNTER — Ambulatory Visit (HOSPITAL_BASED_OUTPATIENT_CLINIC_OR_DEPARTMENT_OTHER): Payer: Medicare Other | Attending: Pulmonary Disease | Admitting: Physical Therapy

## 2021-11-01 ENCOUNTER — Encounter (HOSPITAL_BASED_OUTPATIENT_CLINIC_OR_DEPARTMENT_OTHER): Payer: Self-pay | Admitting: Physical Therapy

## 2021-11-01 ENCOUNTER — Other Ambulatory Visit: Payer: Self-pay

## 2021-11-01 DIAGNOSIS — U099 Post covid-19 condition, unspecified: Secondary | ICD-10-CM | POA: Insufficient documentation

## 2021-11-01 DIAGNOSIS — R262 Difficulty in walking, not elsewhere classified: Secondary | ICD-10-CM

## 2021-11-01 DIAGNOSIS — M6281 Muscle weakness (generalized): Secondary | ICD-10-CM

## 2021-11-01 DIAGNOSIS — J849 Interstitial pulmonary disease, unspecified: Secondary | ICD-10-CM | POA: Insufficient documentation

## 2021-11-01 NOTE — Therapy (Addendum)
OUTPATIENT PHYSICAL THERAPY LOWER EXTREMITY EVALUATION   Patient Name: Tracy Young MRN: [id number] DOB:[birth year], 76 y.o., female Today's Date: 11/01/2021   PT End of Session - 11/01/21 1745     Visit Number 1    Number of Visits 12    Date for PT Re-Evaluation 12/13/21    Authorization Type MCR A&B    Progress Note Due on Visit 10    PT Start Time 1605    PT Stop Time 1700    PT Time Calculation (min) 55 min    Activity Tolerance Patient tolerated treatment well    Behavior During Therapy [facility] for tasks assessed/performed             Past Medical History:  Diagnosis Date   Chronic kidney disease, stage 3a ([place]) 06/27/2021   COVID-19    GERD without esophagitis 06/27/2021   Hypertension    Mild intermittent asthma without complication 92/07/9416   Mixed hyperlipidemia 06/27/2021   Pneumonia    Past Surgical History:  Procedure Laterality Date   TOTAL KNEE ARTHROPLASTY     Patient Active Problem List   Diagnosis Date Noted   COVID-19 virus infection 06/27/2021   Acute respiratory failure with hypoxia ([place]) 06/27/2021   Chronic kidney disease, stage 3a ([place]) 06/27/2021   GERD without esophagitis 06/27/2021   Mixed hyperlipidemia 06/27/2021   Mild intermittent asthma with (acute) exacerbation 06/27/2021   Sepsis due to COVID-19 ([place]) 06/27/2021    PCP: Maury Dus, MD  REFERRING PROVIDER: Marshell Garfinkel, MD  REFERRING DIAG: 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  She was   hospitalized 11 days for Covid and pneumonia in October 2022 and now has developed interstitial lung disease.  Pt was on O2 but has not used O2 in 6 weeks.  Pt had a CT scan of her lungs.  She saw cardiologist and he  states her heart looks good.  Pt and husband state her HR has not been under 100 since having Covid in October.  Pt and husband state that MDs are aware of her high HR.      -Pt received HHPT and HHOT and made good improvement.  Pt is not receiving pulmonary rehab due to not being on oxygen.   -Pt is limited with ambulation and becomes very fatigued with ambulation.  Pt uses a rollator for long distance or in a crowd.  Pt is able to ambulate 1/4 mile though has labored breathing and LE fatigue.  Pt becomes fatigued with standing to cook.  Fatigue and difficulty with performing stairs.  She just started showering independently over the past few weeks.   Pt states she has been having bilat feet pain.  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  She receives lumbar steroid injections which improves bilat feet pain.    PERTINENT HISTORY: Tachycardia, depth perception issues, lumbar arthritis, bilat TKA in 2012, Coronary artery calcification,  HTN, Chronic kidney disease, stage 3a which she states she no longer has.  PAIN:  Are you having pain? No   PRECAUTIONS: Pt has some unsteadiness with gait.  Pt's husband states she has depth perception issues.  Tachycardia  WEIGHT BEARING RESTRICTIONS No  FALLS:  Has patient fallen in last 6 months? Yes, Number of falls: 3-4.  Pt's husband states she has depth perception issues.  LIVING ENVIRONMENT: Lives with: lives with their spouse Lives in: 2 story home but 2nd floor is only a bonus room Stairs: Yes; 5 steps to enter home with rail, and 1 flight of stairs to enter bonus room Has following equipment at home: Single point cane, Walker - 2 wheeled, and rollator  OCCUPATION: Pt is retired.  PLOF: Independent; Pt was able to perform her normal household chores and functional mobility skills without limitation.  Pt did not use an AD with extended ambulation.   PATIENT GOALS improve LE and UE strength, improve ambulation distance.     OBJECTIVE:    DIAGNOSTIC FINDINGS: CT scan for chest  PATIENT SURVEYS:  FOTO 53 with a goal of 65 at visit#10.  COGNITION:  Overall cognitive status: Within functional limits for tasks assessed    VITALS: BP:  153/87, HR:  105 bpm PULSE OXIMETER:  HR:  106-114 bpm ; O2:  97%  SENSATION:  Light touch: Appears intact; 2+ to LT t/o bilat LEs     LE/UE MMT:  MMT Right 11/01/2021 Left 11/01/2021  Hip flexion 4/5 4/5  Hip extension    Hip abduction 4-/5 4-/5  Hip adduction    Hip internal rotation    Hip external rotation 5/5 5/5  Knee flexion 5/5 tested in sitting 5/5 tested in sitting  Knee extension 5/5 4/5  Ankle dorsiflexion 5/5 5/5  Ankle plantarflexion WFL tested in sitting WFL tested in sitting  Shoulder Flex 5/5 5/5  Shoulder Abd 5/5 4+/5   (Blank rows = not tested)  UE ROM:  ROM Right 11/01/2021 Left 11/01/2021  Hip flexion    Hip extension    Hip abduction    Hip adduction    Shoulder Flex [facility] WFL  Shoulder Abd WFL with some limitation but symmetrical WFL with some limitation but symmetrical  Knee flexion    Knee extension    Ankle dorsiflexion    Ankle plantarflexion    Ankle inversion    Ankle eversion     (Blank rows = not tested)    FUNCTIONAL TESTS:  5 times sit to stand: 14 seconds without UEs ; HR did not significantly increase after 5x STS test.  GAIT: Comments: Pt ambulates with a heel to toe gait with reciprocal arm swing.  Pt had decreased foot clearance and decreased gait speed.  She had some unsteadiness with gait having increased sway with ambulation and actually crossed her feet one time.   Pt was fatigued with ambulating minimal distance in the hallway multiple reps.  Her HR increased to 122-123 and O2 decreased to 92% after ambulation which improved after sitting down.      TODAY'S TREATMENT: Reviewed current HEP from [facility] which pt states she is not doing as much as she should.  Pt performs standing marching, mini squats, heel and toe raises,  sidestepping.  Pt demonstrates increased fwd knee translation with squats.  PT educated pt with correct form.  See below for pt education.     PATIENT EDUCATION:  Education details: POC, rationale of exercises/treatment, and objective findings.  Form with mini-squats.   Person educated: Patient and Spouse Education method: Explanation, Demonstration, and Verbal cues Education comprehension: verbalized understanding, verbal cues required, and needs further education   HOME EXERCISE PROGRAM: Pt has a HEP form HHPT.  ASSESSMENT:  CLINICAL IMPRESSION: Patient is a 76 y.o. 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  Pt was on O2 for awhile though has now been off of O2 for 6 weeks.  Pt has a high resting HR and has been cleared by cardiologist.  Pt and husband state that MDs are aware of her high HR which has not been under 100 (except 1 time) since having Covid in October.  Pt had some unsteadiness with gait and was fatigued with minimal ambulation.  Her HR also increased with minimal ambulation.  Pt is limited with functional mobility including ambulation distance and performance of household chores due to weakness and decreased tolerance to activity.  Patient should benefit from skilled PT to address above impairments and improve overall function.  Objective impairments include Abnormal gait, cardiopulmonary status limiting activity, decreased activity tolerance, decreased balance, decreased endurance, decreased mobility, difficulty walking, and decreased strength. These impairments are limiting patient from cleaning, community activity, meal prep, shopping, and ambulation . Personal factors including 3+ comorbidities: Tachycardia, depth perception issues, lumbar arthritis, and bilat TKA  are also affecting patient's functional outcome.   REHAB POTENTIAL: Good  CLINICAL DECISION  MAKING: Evolving/moderate complexity  EVALUATION COMPLEXITY: Moderate   GOALS: Goals reviewed with patient? No  SHORT TERM GOALS:  STG Name Target Date Goal status  1 Pt will be independent and compliant with HEP for improved strength, tolerance to activity, stability, and function.  Baseline:  11/22/2021 INITIAL  2 Pt will be able to increase her walking program by 50% without adverse effects and demo improved  tolerance to and stability with gait in the clinic.  Baseline:  11/29/2021 INITIAL  3 Pt will ambulate community distance without significant fatigue.  Baseline: 11/29/2021 INITIAL  4 Pt will be able to stand to cook without significant difficulty or fatigue. Baseline: 11/29/2021 INITIAL                  LONG TERM GOALS:   LTG Name Target Date Goal status  1 Pt will be able to ambulate in grocery store to shop without significant fatigue and difficulty.  Baseline: 12/13/2021 INITIAL  2 Pt will demo 5/5 bilat hip flex and abd strength and L knee extension strength for improved performance of and tolerance with functional mobility.  Baseline: 12/13/2021 INITIAL  3 Pt will report she is able to stand to perform her ADLs and household chores without significant fatigue or difficulty.   Baseline: 12/13/2021 INITIAL  4 Pt will be able to ambulate extended community distance without significant difficulty.  Baseline: 12/13/2021 INITIAL  5 Pt will be able to perform a reciprocal gait on the stairs with 1 rail.  Baseline: 12/13/2021 INITIAL             PLAN: PT FREQUENCY: 2x/week  PT DURATION: 6 weeks  PLANNED INTERVENTIONS: Therapeutic exercises, Therapeutic activity, Neuro Muscular re-education, Balance training, Gait training, Patient/Family education, Joint mobilization, Stair training, DME instructions, Aquatic Therapy, Cryotherapy, Moist heat, Taping, and Manual therapy  PLAN FOR NEXT SESSION: Perform her current HEP and progress exercises slowly per pt tolerance.  Monitor O2  and HR with exercise.  Perform Nustep and balance exercises including standing with FT, tandem stance with UE support, and standing on airex. Will perform 6 MWT when HR has improved.    Selinda Michaels III PT, DPT 11/01/21 5:48 PM

## 2021-11-06 NOTE — Therapy (Signed)
OUTPATIENT PHYSICAL THERAPY TREATMENT NOTE   Patient Name: Tracy Young MRN: [id number] DOB:March 24, 1946, 76 y.o., female Today's Date: 11/07/2021  PCP: Maury Dus, MD  PT End of Session - 11/07/21 1534     Visit Number 2    Number of Visits 12    Date for PT Re-Evaluation 12/13/21    Authorization Type MCR A&B    Progress Note Due on Visit 10    PT Start Time 1435    PT Stop Time 3545    PT Time Calculation (min) 42 min    Activity Tolerance Patient tolerated treatment well    Behavior During Therapy [facility] for tasks assessed/performed             Past Medical History:  Diagnosis Date   Chronic kidney disease, stage 3a ([place]) 06/27/2021   COVID-19    GERD without esophagitis 06/27/2021   Hypertension    Mild intermittent asthma without complication 62/56/3893   Mixed hyperlipidemia 06/27/2021   Pneumonia    Past Surgical History:  Procedure Laterality Date   TOTAL KNEE ARTHROPLASTY     Patient Active Problem List   Diagnosis Date Noted   COVID-19 virus infection 06/27/2021   Acute respiratory failure with hypoxia ([place]) 06/27/2021   Chronic kidney disease, stage 3a ([place]) 06/27/2021   GERD without esophagitis 06/27/2021   Mixed hyperlipidemia 06/27/2021   Mild intermittent asthma with (acute) exacerbation 06/27/2021   Sepsis due to COVID-19 [facility]) 06/27/2021    REFERRING PROVIDER: Marshell Garfinkel, MD   REFERRING DIAG: 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  She was  hospitalized 11 days for Covid and pneumonia in October 2022 and now has developed interstitial lung disease.  Pt was on O2 but has not used O2 in 6 weeks.  Pt had a CT scan of her lungs.  She saw cardiologist and he states her  heart looks good.  Pt and husband state her HR has not been under 100 since having Covid in October.  Pt and husband state that MDs are aware of her high HR.        -Pt is limited with ambulation and becomes very fatigued with ambulation.  Pt uses a rollator for long distance or in a crowd.  Pt is able to ambulate 1/4 mile though has labored breathing and LE fatigue.  Pt becomes fatigued with standing to cook though was able to perform more cooking this weekend with less fatigue.  She has fatigue and difficulty with performing stairs.     -Pt is a little worn out today; she had family visiting this past weekend.  Pt states she felt good after prior Rx and was tired.  Pt's husband reports that pt walked 4/10 of a mile outdoors.  Her HR increased to 150 bpm and her HR quickly reduced when she stopped ambulation.  Her HR decreased to low teens and then to 106 - 108.  Pt denies pain currently.  Pt states she performed her home exercises once since last PT rx.        PERTINENT HISTORY: Tachycardia, interstitial lung disease, depth perception issues, lumbar arthritis, bilat TKA in 2012, Coronary artery calcification,  HTN, Chronic kidney disease, stage 3a which she states she no  longer has.   PAIN:  Are you having pain? No     PRECAUTIONS: Pt has some unsteadiness with gait.  Pt's husband states she has depth perception issues.  Tachycardia   WEIGHT BEARING RESTRICTIONS No   FALLS:  Has patient fallen in last 6 months? Yes, Number of falls: 3-4.  Pt's husband states she has depth perception issues.   LIVING ENVIRONMENT: Lives with: lives with their spouse Lives in: 2 story home but 2nd floor is only a bonus room Stairs: Yes; 5 steps to enter home with rail, and 1 flight of stairs to enter bonus room Has following equipment at home: Single point cane, Walker - 2 wheeled, and rollator   OCCUPATION: Pt is retired.   PLOF: Independent; Pt was able to perform her normal household chores and  functional mobility skills without limitation.  Pt did not use an AD with extended ambulation.    PATIENT GOALS improve LE and UE strength, improve ambulation distance.       OBJECTIVE:    VITALS: At beginning of Rx: PULSE OXIMETER:  HR:  96-98 bpm ; O2:  98%  After 3-4 standing exercises:  HR:  119-122, O2:  94%  After Nu-step:  HR: 111-113 bpm, O2: 96%     TODAY'S TREATMENT: -Reviewed current HEP from [facility], current function, response to prior Rx, and pain level.  -Assessed HR/O2 during Rx -Pt performed:  Marching with 1 UE assist x 10 reps on floor and 2x10 reps on airex  Heel/Toe raises 2x10 reps   Sidestepping x 2 laps  Supine SLR 2x10 reps  Supine clams with RTB x 15 and x 20 reps  S/L Hip abduction 2x10 reps  Mini squats with UE assist x 10 reps  Standing on airex 3x30 sec  Nu-step at L2 with UE/LE x 4 mins -Pt received a HEP handout and was educated in correct form and appropriate frequency.      PATIENT EDUCATION:  Education details:  Pt received a HEP handout and was educated in correct form and appropriate frequency.  Pt instructed to stop any exercise if she has SOB.  Reviewed HH HEP.  POC, rationale of exercises/treatment, and exercise form    Person educated: Patient and Spouse Education method: Explanation, Demonstration, and Verbal cues Education comprehension: verbalized understanding, verbal cues required, and needs further education     HOME EXERCISE PROGRAM: Access Code: [facility] URL: [email or web address] Date: 11/07/2021 Prepared by: Ronny Flurry  Exercises Supine Active Straight Leg Raise - 1 x daily - 5-6 x weekly - 1-2 sets - 10 reps Hooklying Clamshell with Resistance - 1 x daily - 3-4 x weekly - 2 sets - 10 reps Sidelying Hip Abduction - 1 x daily - 5-6 x weekly - 1-2 sets - 10 reps     ASSESSMENT:   CLINICAL IMPRESSION: Pt has tachycardia and presents today with a lower HR than compared to prior Rx.  Pt's husband reports  pt's HR increasing significantly with ambulating 4/10 of a mile though quickly decreased when resting.  Her HR increased with standing activities in the clinic as well and improved with sitting down and resting.  Pt able to perform Nu-step at L2 for 4 mins without significant increased HR.  PT reviewed her current HH HEP and updated HEP.  Pt demonstrates good understanding of HEP.  Pt gives good effort with all exercises and performed exercises well with cuing and instruction in correct form.   Pt is limited with functional mobility including  ambulation distance and performance of household chores due to weakness and decreased tolerance to activity.  Pt responded well to Rx stating she felt better after Rx than before Rx.   Patient should benefit from skilled PT to address above impairments and improve overall function.   Objective impairments include Abnormal gait, cardiopulmonary status limiting activity, decreased activity tolerance, decreased balance, decreased endurance, decreased mobility, difficulty walking, and decreased strength. These impairments are limiting patient from cleaning, community activity, meal prep, shopping, and ambulation . Personal factors including 3+ comorbidities: Tachycardia, depth perception issues, lumbar arthritis, and bilat TKA  are also affecting patient's functional outcome.    REHAB POTENTIAL: Good   CLINICAL DECISION MAKING: Evolving/moderate complexity   EVALUATION COMPLEXITY: Moderate     GOALS:   SHORT TERM GOALS:   STG Name Target Date Goal status  1 Pt will be independent and compliant with HEP for improved strength, tolerance to activity, stability, and function.  Baseline:  11/22/2021 INITIAL  2 Pt will be able to increase her walking program by 50% without adverse effects and demo improved  tolerance to and stability with gait in the clinic.  Baseline:  11/29/2021 INITIAL  3 Pt will ambulate community distance without significant fatigue.  Baseline:  11/29/2021 INITIAL  4 Pt will be able to stand to cook without significant difficulty or fatigue. Baseline: 11/29/2021 INITIAL                               LONG TERM GOALS:    LTG Name Target Date Goal status  1 Pt will be able to ambulate in grocery store to shop without significant fatigue and difficulty.  Baseline: 12/13/2021 INITIAL  2 Pt will demo 5/5 bilat hip flex and abd strength and L knee extension strength for improved performance of and tolerance with functional mobility.  Baseline: 12/13/2021 INITIAL  3 Pt will report she is able to stand to perform her ADLs and household chores without significant fatigue or difficulty.   Baseline: 12/13/2021 INITIAL  4 Pt will be able to ambulate extended community distance without significant difficulty.  Baseline: 12/13/2021 INITIAL  5 Pt will be able to perform a reciprocal gait on the stairs with 1 rail.  Baseline: 12/13/2021 INITIAL                      PLAN: PT FREQUENCY: 2x/week   PT DURATION: 6 weeks   PLANNED INTERVENTIONS: Therapeutic exercises, Therapeutic activity, Neuro Muscular re-education, Balance training, Gait training, Patient/Family education, Joint mobilization, Stair training, DME instructions, Aquatic Therapy, Cryotherapy, Moist heat, Taping, and Manual therapy   PLAN FOR NEXT SESSION: Perform her current HEP and progress exercises slowly per pt tolerance.  Monitor O2 and HR with exercise.  Perform Nustep and balance exercises including standing with FT, tandem stance with UE support, and standing on airex. Will perform 6 MWT when HR has improved.      Selinda Michaels III PT, DPT 11/07/21 3:36 PM

## 2021-11-07 ENCOUNTER — Other Ambulatory Visit: Payer: Self-pay

## 2021-11-07 ENCOUNTER — Encounter (HOSPITAL_BASED_OUTPATIENT_CLINIC_OR_DEPARTMENT_OTHER): Payer: Self-pay | Admitting: Physical Therapy

## 2021-11-07 ENCOUNTER — Ambulatory Visit (HOSPITAL_BASED_OUTPATIENT_CLINIC_OR_DEPARTMENT_OTHER): Payer: Medicare Other | Admitting: Physical Therapy

## 2021-11-07 DIAGNOSIS — M6281 Muscle weakness (generalized): Secondary | ICD-10-CM

## 2021-11-07 DIAGNOSIS — J849 Interstitial pulmonary disease, unspecified: Secondary | ICD-10-CM | POA: Diagnosis not present

## 2021-11-07 DIAGNOSIS — R262 Difficulty in walking, not elsewhere classified: Secondary | ICD-10-CM | POA: Diagnosis not present

## 2021-11-07 DIAGNOSIS — U099 Post covid-19 condition, unspecified: Secondary | ICD-10-CM | POA: Diagnosis not present

## 2021-11-16 DIAGNOSIS — R059 Cough, unspecified: Secondary | ICD-10-CM | POA: Diagnosis not present

## 2021-11-16 DIAGNOSIS — U071 COVID-19: Secondary | ICD-10-CM | POA: Diagnosis not present

## 2021-11-17 DIAGNOSIS — R051 Acute cough: Secondary | ICD-10-CM | POA: Diagnosis not present

## 2021-11-17 DIAGNOSIS — Z03818 Encounter for observation for suspected exposure to other biological agents ruled out: Secondary | ICD-10-CM | POA: Diagnosis not present

## 2021-11-23 ENCOUNTER — Ambulatory Visit (HOSPITAL_BASED_OUTPATIENT_CLINIC_OR_DEPARTMENT_OTHER): Payer: Medicare Other | Attending: Pulmonary Disease | Admitting: Physical Therapy

## 2021-11-23 ENCOUNTER — Other Ambulatory Visit: Payer: Self-pay

## 2021-11-23 ENCOUNTER — Encounter (HOSPITAL_BASED_OUTPATIENT_CLINIC_OR_DEPARTMENT_OTHER): Payer: Self-pay | Admitting: Physical Therapy

## 2021-11-23 DIAGNOSIS — M6281 Muscle weakness (generalized): Secondary | ICD-10-CM | POA: Diagnosis not present

## 2021-11-23 DIAGNOSIS — R262 Difficulty in walking, not elsewhere classified: Secondary | ICD-10-CM

## 2021-11-23 NOTE — Therapy (Signed)
OUTPATIENT PHYSICAL THERAPY TREATMENT NOTE   Patient Name: Tracy Young MRN: [id number] DOB:[birth year], 76 y.o., female Today's Date: 11/24/2021  PCP: Maury Dus, MD  PT End of Session - 11/23/21 1541     Visit Number 3    Number of Visits 12    Date for PT Re-Evaluation 12/13/21    Authorization Type MCR A&B    PT Start Time 1525    PT Stop Time 8657    PT Time Calculation (min) 28 min    Activity Tolerance --   limited due to vitals   Behavior During Therapy [facility] for tasks assessed/performed              Past Medical History:  Diagnosis Date   Chronic kidney disease, stage 3a ([place]) 06/27/2021   COVID-19    GERD without esophagitis 06/27/2021   Hypertension    Mild intermittent asthma without complication 84/69/6295   Mixed hyperlipidemia 06/27/2021   Pneumonia    Past Surgical History:  Procedure Laterality Date   TOTAL KNEE ARTHROPLASTY     Patient Active Problem List   Diagnosis Date Noted   COVID-19 virus infection 06/27/2021   Acute respiratory failure with hypoxia ([place]) 06/27/2021   Chronic kidney disease, stage 3a ([place]) 06/27/2021   GERD without esophagitis 06/27/2021   Mixed hyperlipidemia 06/27/2021   Mild intermittent asthma with (acute) exacerbation 06/27/2021   Sepsis due to COVID-19 [facility]) 06/27/2021    REFERRING PROVIDER: Marshell Garfinkel, MD   REFERRING DIAG: 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  She was  hospitalized 11 days for Covid and pneumonia in October 2022 and now has developed interstitial lung disease.  Pt was on O2 but not now.  Pt had a CT scan of her lungs.  She saw cardiologist and he states her heart looks good.  Pt and husband state that MDs are aware  of her high HR.        -Pt is limited with ambulation and becomes very fatigued with ambulation.  Pt uses a rollator for long distance or in a crowd. Pt becomes fatigued with standing to cook.  She has fatigue and difficulty with performing stairs.    -Pt denies any adverse effects after prior Rx.  She became sick later and tested negative for COVID, flu, and RSV.  She had a sinus infection and bronchitis.  Pt states she didn't do her exercises or her walking program due to being sick.  She has been on antibiotics for 5 days and just started back her HEP yesterday.      PERTINENT HISTORY: Tachycardia, interstitial lung disease, depth perception issues, lumbar arthritis, bilat TKA in 2012, Coronary artery calcification,  HTN, Chronic kidney disease, stage 3a which she states she no longer has.   PAIN:  Are you having pain? No     PRECAUTIONS: Pt has some unsteadiness with gait.  Pt's husband states she has depth perception issues.  Tachycardia   WEIGHT BEARING RESTRICTIONS No   FALLS:  Has patient fallen in last 6 months? Yes, Number of falls: 3-4.  Pt's husband states she has depth perception issues.    PLOF: Independent; Pt was able to perform her normal household chores and functional mobility skills without  limitation.  Pt did not use an AD with extended ambulation.    PATIENT GOALS improve LE and UE strength, improve ambulation distance.       OBJECTIVE:    VITALS: After Nu-step:  HR: 122 bpm, O2: 97% After supine and S/L exercises:  HR:  131, O2:  95%  which improved to HR:  110-113 and O2: 96% After standing on airex:  HR:  136, O2: 96% which improved to 113 - 117      TODAY'S TREATMENT: -Reviewed current HEP from [facility], current function, response to prior Rx, and pain level.  -Assessed HR/O2 during Rx -Pt performed:  Supine SLR 2x10 reps  S/L Hip abduction 2x10 reps  Standing on airex with FA and FT 2x30 sec each  Nu-step at L2 with UE/LE x 4 mins    PATIENT  EDUCATION:  Education details:  Vitals, HEP, POC, rationale of exercises/treatment, and exercise form.  Appropriate response to exercises and HEP.    Person educated: Patient and Spouse Education method: Explanation, Demonstration, and Verbal cues Education comprehension: verbalized understanding, verbal cues required, and needs further education     HOME EXERCISE PROGRAM: Access Code: [facility] URL: [email or web address] Date: 11/07/2021 Prepared by: Ronny Flurry  Exercises Supine Active Straight Leg Raise - 1 x daily - 5-6 x weekly - 1-2 sets - 10 reps Hooklying Clamshell with Resistance - 1 x daily - 3-4 x weekly - 2 sets - 10 reps Sidelying Hip Abduction - 1 x daily - 5-6 x weekly - 1-2 sets - 10 reps     ASSESSMENT:   CLINICAL IMPRESSION: Pt missed last PT session due to being sick.  She has been inactive due to being sick and is feeling better now since being on antibiotics.  Pt has tachycardia.  She had a significantly high HR with low intensity exercises today which did improve with resting.  PT stopped Rx due to pt's high HR with exercise.  Pt's heart rate was fluctuating some.  Pt and husband deny any A-fib.  She has been checked out by cardiologist and has had an echocardiogram.  PT provided education concerning amount of HEP and appropriate response including breathing, fatigue, and vitals.  Patient should benefit from skilled PT to address above impairments and improve overall function.    Objective impairments include Abnormal gait, cardiopulmonary status limiting activity, decreased activity tolerance, decreased balance, decreased endurance, decreased mobility, difficulty walking, and decreased strength. These impairments are limiting patient from cleaning, community activity, meal prep, shopping, and ambulation . Personal factors including 3+ comorbidities: Tachycardia, depth perception issues, lumbar arthritis, and bilat TKA  are also affecting patient's  functional outcome.    REHAB POTENTIAL: Good   CLINICAL DECISION MAKING: Evolving/moderate complexity   EVALUATION COMPLEXITY: Moderate     GOALS:   SHORT TERM GOALS:   STG Name Target Date Goal status  1 Pt will be independent and compliant with HEP for improved strength, tolerance to activity, stability, and function.  Baseline:  11/22/2021 INITIAL  2 Pt will be able to increase her walking program by 50% without adverse effects and demo improved  tolerance to and stability with gait in the clinic.  Baseline:  11/29/2021 INITIAL  3 Pt will ambulate community distance without significant fatigue.  Baseline: 11/29/2021 INITIAL  4 Pt will be able to stand to cook without significant difficulty or fatigue. Baseline: 11/29/2021 INITIAL  LONG TERM GOALS:    LTG Name Target Date Goal status  1 Pt will be able to ambulate in grocery store to shop without significant fatigue and difficulty.  Baseline: 12/13/2021 INITIAL  2 Pt will demo 5/5 bilat hip flex and abd strength and L knee extension strength for improved performance of and tolerance with functional mobility.  Baseline: 12/13/2021 INITIAL  3 Pt will report she is able to stand to perform her ADLs and household chores without significant fatigue or difficulty.   Baseline: 12/13/2021 INITIAL  4 Pt will be able to ambulate extended community distance without significant difficulty.  Baseline: 12/13/2021 INITIAL  5 Pt will be able to perform a reciprocal gait on the stairs with 1 rail.  Baseline: 12/13/2021 INITIAL                      PLAN: PT FREQUENCY: 2x/week   PT DURATION: 6 weeks   PLANNED INTERVENTIONS: Therapeutic exercises, Therapeutic activity, Neuro Muscular re-education, Balance training, Gait training, Patient/Family education, Joint mobilization, Stair training, DME instructions, Aquatic Therapy, Cryotherapy, Moist heat, Taping, and Manual therapy   PLAN FOR NEXT SESSION:  Monitor O2 and  HR with exercise.  Assess if pt's vitals respond better next Rx.  Progress exercises slowly per pt tolerance.   Perform Nustep and balance exercises including standing with FT, tandem stance with UE support, and standing on airex. Will perform 6 MWT when HR has improved.      Selinda Michaels III PT, DPT 11/24/21 10:39 AM

## 2021-11-25 ENCOUNTER — Other Ambulatory Visit: Payer: Self-pay

## 2021-11-25 ENCOUNTER — Encounter (HOSPITAL_BASED_OUTPATIENT_CLINIC_OR_DEPARTMENT_OTHER): Payer: Self-pay | Admitting: Physical Therapy

## 2021-11-25 ENCOUNTER — Ambulatory Visit (HOSPITAL_BASED_OUTPATIENT_CLINIC_OR_DEPARTMENT_OTHER): Payer: Medicare Other | Admitting: Physical Therapy

## 2021-11-25 DIAGNOSIS — R262 Difficulty in walking, not elsewhere classified: Secondary | ICD-10-CM

## 2021-11-25 DIAGNOSIS — M6281 Muscle weakness (generalized): Secondary | ICD-10-CM

## 2021-11-25 NOTE — Therapy (Signed)
?OUTPATIENT PHYSICAL THERAPY TREATMENT NOTE ? ? ?Patient Name: Tracy Young ?MRN: [id number] ?DOB:March 04, 1946, 76 y.o., female ?Today's Date: 11/25/2021 ? ?PCP: Maury Dus, MD ? PT End of Session - 11/25/21 2045   ? ? Visit Number 3   ? PT Start Time 1106   ? ?  ?  ? ?  ? ? ? ? ?Past Medical History:  ?Diagnosis Date  ? Chronic kidney disease, stage 3a ([place]) 06/27/2021  ? COVID-19   ? GERD without esophagitis 06/27/2021  ? Hypertension   ? Mild intermittent asthma without complication 71/24/5809  ? Mixed hyperlipidemia 06/27/2021  ? Pneumonia   ? ?Past Surgical History:  ?Procedure Laterality Date  ? TOTAL KNEE ARTHROPLASTY    ? ?Patient Active Problem List  ? Diagnosis Date Noted  ? COVID-19 virus infection 06/27/2021  ? Acute respiratory failure with hypoxia ([place]) 06/27/2021  ? Chronic kidney disease, stage 3a ([place]) 06/27/2021  ? GERD without esophagitis 06/27/2021  ? Mixed hyperlipidemia 06/27/2021  ? Mild intermittent asthma with (acute) exacerbation 06/27/2021  ? Sepsis due to COVID-19 [facility]) 06/27/2021  ? ? ?REFERRING PROVIDER: Marshell Garfinkel, MD ?  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  ?THERAPY DIAG:  ?Muscle weakness (generalized) ?  ?Difficulty in walking, not elsewhere classified ?  ?ONSET DATE: October 2022 ?  ?SUBJECTIVE:  ?  ?SUBJECTIVE STATEMENT: ?Pt's husband states her BP was a little high this AM though improved after taking BP meds.  He states her BP is normal now.  Pt states she had a good feeling after prior Rx and had no adverse effects.  Pt had a sinus infection and bronchitis and has been on antibiotics for 7 days. ?  ?PERTINENT HISTORY: ?Tachycardia, interstitial lung disease, depth perception issues, lumbar arthritis, bilat TKA in 2012, Coronary artery calcification,  HTN, Chronic kidney disease, stage 3a which she states she no longer has. ?  ?  ?  ?PRECAUTIONS: Pt has some unsteadiness with gait.  Pt's husband states she has depth perception issues.  Tachycardia ?   ?WEIGHT BEARING RESTRICTIONS No ?  ?FALLS:  ?Has patient fallen in last 6 months? Yes, Number of falls: 3-4.  Pt's husband states she has depth perception issues. ?  ? ?PLOF: Independent; Pt was able to perform her normal household chores and functional mobility skills without limitation.  Pt did not use an AD with extended ambulation.  ?  ?PATIENT GOALS improve LE and UE strength, improve ambulation distance.   ?  ?  ?OBJECTIVE:  ?  ?VITALS: ?At beginning of Rx at rest:  105 - 121 bpm, O2:  96%    ?99 - 120 though mainly in 105-114 range, O2:  97% ?  ?  ?TODAY'S TREATMENT: ?-PT withheld Rx. ? ? ?PATIENT EDUCATION:  ?Education details:  Vitals and POC. ?Person educated: Patient and Spouse ?Education method: Explanation,  ?Education comprehension: verbalized understanding ? ? ?HOME EXERCISE PROGRAM: ?Access Code: [email or web address] ?URL: [email or web address] ?Date: 11/07/2021 ?Prepared by: Ronny Flurry ? ?Exercises ?Supine Active Straight Leg Raise - 1 x daily - 5-6 x weekly - 1-2 sets - 10 reps ?Hooklying Clamshell with Resistance - 1 x daily - 3-4 x weekly - 2 sets - 10 reps ?Sidelying Hip Abduction - 1 x daily - 5-6 x weekly - 1-2 sets - 10 reps ? ? ?  ?ASSESSMENT: ?  ?CLINICAL IMPRESSION: ?Pt has been sick and is currently on antibiotics.  She states she is feeling better.  PT assessed vitals before treating  patient.  Pt had a high HR which did improve the 2nd time taking it.  Though it did improve, her HR was still variable.  PT withheld Rx today due to pt's high HR.     ? ?Objective impairments include Abnormal gait, cardiopulmonary status limiting activity, decreased activity tolerance, decreased balance, decreased endurance, decreased mobility, difficulty walking, and decreased strength. These impairments are limiting patient from cleaning, community activity, meal prep, shopping, and ambulation . Personal factors including 3+ comorbidities: Tachycardia, depth perception issues, lumbar arthritis, and  bilat TKA  are also affecting patient's functional outcome.  ?  ?REHAB POTENTIAL: Good ?  ?CLINICAL DECISION MAKING: Evolving/moderate complexity ?  ?EVALUATION COMPLEXITY: Moderate ?  ?  ?GOALS: ?  ?SHORT TERM GOALS: ?  ?STG Name Target Date Goal status  ?1 Pt will be independent and compliant with HEP for improved strength, tolerance to activity, stability, and function.  ?Baseline:  11/22/2021 INITIAL  ?2 Pt will be able to increase her walking program by 50% without adverse effects and demo improved  tolerance to and stability with gait in the clinic.  ?Baseline:  11/29/2021 INITIAL  ?3 Pt will ambulate community distance without significant fatigue.  ?Baseline: 11/29/2021 INITIAL  ?4 Pt will be able to stand to cook without significant difficulty or fatigue. ?Baseline: 11/29/2021 INITIAL  ?         ?         ?         ?  ?LONG TERM GOALS:  ?  ?LTG Name Target Date Goal status  ?1 Pt will be able to ambulate in grocery store to shop without significant fatigue and difficulty.  ?Baseline: 12/13/2021 INITIAL  ?2 Pt will demo 5/5 bilat hip flex and abd strength and L knee extension strength for improved performance of and tolerance with functional mobility.  ?Baseline: 12/13/2021 INITIAL  ?3 Pt will report she is able to stand to perform her ADLs and household chores without significant fatigue or difficulty.   ?Baseline: 12/13/2021 INITIAL  ?4 Pt will be able to ambulate extended community distance without significant difficulty.  ?Baseline: 12/13/2021 INITIAL  ?5 Pt will be able to perform a reciprocal gait on the stairs with 1 rail.  ?Baseline: 12/13/2021 INITIAL  ?         ?         ?  ?PLAN: ?PT FREQUENCY: 2x/week ?  ?PT DURATION: 6 weeks ?  ?PLANNED INTERVENTIONS: Therapeutic exercises, Therapeutic activity, Neuro Muscular re-education, Balance training, Gait training, Patient/Family education, Joint mobilization, Stair training, DME instructions, Aquatic Therapy, Cryotherapy, Moist heat, Taping, and Manual therapy ?   ?PLAN FOR NEXT SESSION:  Assess HR before Rx.   ? ? ?Selinda Michaels III PT, DPT ?11/25/21 9:13 PM ? ? ? ? ?  ? ?

## 2021-11-28 ENCOUNTER — Other Ambulatory Visit: Payer: Self-pay

## 2021-11-28 ENCOUNTER — Encounter (HOSPITAL_BASED_OUTPATIENT_CLINIC_OR_DEPARTMENT_OTHER): Payer: Self-pay | Admitting: Physical Therapy

## 2021-11-28 ENCOUNTER — Ambulatory Visit (HOSPITAL_BASED_OUTPATIENT_CLINIC_OR_DEPARTMENT_OTHER): Payer: Medicare Other | Admitting: Physical Therapy

## 2021-11-28 DIAGNOSIS — M6281 Muscle weakness (generalized): Secondary | ICD-10-CM | POA: Diagnosis not present

## 2021-11-28 DIAGNOSIS — R262 Difficulty in walking, not elsewhere classified: Secondary | ICD-10-CM | POA: Diagnosis not present

## 2021-11-28 NOTE — Therapy (Addendum)
?OUTPATIENT PHYSICAL THERAPY TREATMENT NOTE ? ? ?Patient Name: Tracy Young ?MRN: [id number] ?DOB:09-30-1945, 76 y.o., female ?Today's Date: 11/28/2021 ? ?PCP: Maury Dus, MD ? PT End of Session - 11/28/21 1042   ? ? Visit Number 4   ? Number of Visits 12   ? Date for PT Re-Evaluation 12/13/21   ? Authorization Type MCR A&B   ? Progress Note Due on Visit 10   ? PT Start Time 1025   ? PT Stop Time 1058   ? PT Time Calculation (min) 33 min   ? Activity Tolerance --   Rx limited due to vitals though pt had no c/o's  ? Behavior During Therapy [facility] for tasks assessed/performed   ? ?  ?  ? ?  ? ? ? ? ? ?Past Medical History:  ?Diagnosis Date  ? Chronic kidney disease, stage 3a ([place]) 06/27/2021  ? COVID-19   ? GERD without esophagitis 06/27/2021  ? Hypertension   ? Mild intermittent asthma without complication 93/79/0240  ? Mixed hyperlipidemia 06/27/2021  ? Pneumonia   ? ?Past Surgical History:  ?Procedure Laterality Date  ? TOTAL KNEE ARTHROPLASTY    ? ?Patient Active Problem List  ? Diagnosis Date Noted  ? COVID-19 virus infection 06/27/2021  ? Acute respiratory failure with hypoxia ([place]) 06/27/2021  ? Chronic kidney disease, stage 3a ([place]) 06/27/2021  ? GERD without esophagitis 06/27/2021  ? Mixed hyperlipidemia 06/27/2021  ? Mild intermittent asthma with (acute) exacerbation 06/27/2021  ? Sepsis due to COVID-19 [facility]) 06/27/2021  ? ? ?REFERRING PROVIDER: Marshell Garfinkel, MD ?  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  ?THERAPY DIAG:  ?Muscle weakness (generalized) ?  ?Difficulty in walking, not elsewhere classified ?  ?ONSET DATE: October 2022 ?  ?SUBJECTIVE:  ?  ?SUBJECTIVE STATEMENT: ?Pt reports her HR was improving before having bronchitis.  Pt bought an apple watch which can monitor if she is in A-fib and states she is not in A-fib.  Pt states she feels better.  Pt reports her MD told her that her HR should improve with diet and exercise.  Pt's husband states her brain fog is not as evident as  it was.  Pt denies pain currently.  ?Pt completed antibiotics yesterday. ?  ?PERTINENT HISTORY: ?Tachycardia, interstitial lung disease, depth perception issues, lumbar arthritis, bilat TKA in 2012, Coronary artery calcification,  HTN, Chronic kidney disease, stage 3a which she states she no longer has. ?  ?  ?  ?PRECAUTIONS: Pt has some unsteadiness with gait.  Pt's husband states she has depth perception issues.  Tachycardia ?  ?WEIGHT BEARING RESTRICTIONS No ?  ?FALLS:  ?Has patient fallen in last 6 months? Yes, Number of falls: 3-4.  Pt's husband states she has depth perception issues. ?  ? ?PLOF: Independent; Pt was able to perform her normal household chores and functional mobility skills without limitation.  Pt did not use an AD with extended ambulation.  ?  ?PATIENT GOALS improve LE and UE strength, improve ambulation distance.   ?  ?  ?OBJECTIVE:  ?  ?VITALS: ?Max HR (220 - 73) = 147 ?At beginning of Rx at rest:  111 - 122 bpm, O2:  95%.  Her watch states she is not in A-fib.    ?HR:  did reach 136 - 138 during Rx, O2 95% ?At end of Rx:  115 - 125 bpm ?  ?  ?TODAY'S TREATMENT: ?-Reviewed current function and pain level.  ?-Assessed HR/O2 during Rx ?-  Pt performed: ?            Heel/Toe raises 2x10 reps  ?            Supine SLR x10 reps ?            Supine clams with RTB  x 20 reps ?            S/L Hip abduction x10 reps ?            Marching on airex 1x10 reps ?Seated clams with RTB 2x10 reps ?             ? ? ?PATIENT EDUCATION:  ?Education details:  Vitals, exercise form, and POC. ?Person educated: Patient and Spouse ?Education method: Explanation,  ?Education comprehension: verbalized understanding ? ? ?HOME EXERCISE PROGRAM: ?Access Code: [email or web address] ?URL: [email or web address] ?Date: 11/07/2021 ?Prepared by: Ronny Flurry ? ?Exercises ?Supine Active Straight Leg Raise - 1 x daily - 5-6 x weekly - 1-2 sets - 10 reps ?Hooklying Clamshell with Resistance - 1 x daily - 3-4 x weekly - 2 sets - 10  reps ?Sidelying Hip Abduction - 1 x daily - 5-6 x weekly - 1-2 sets - 10 reps ? ? ?  ?ASSESSMENT: ?  ?CLINICAL IMPRESSION: ?Pt presents to Rx stating she is feeling better.  Pt bought an Apple watch like her husband that monitors HR and lets her know if she is in A-fib.  Pt continues to have a HR though seems to be doing better today overall.  HR was closely monitored during Rx/exercises today.  When pt's HR increased, pt would sit down and rest until HR decreased.  Pt did have an increase in HR with exercises which improved each time after sitting down and resting.  Pt's O2 sats did not decrease during Rx.  Treatment and exercises were limited today due to pt's HR.  Pt's breathing was not as labored as 2 treatments ago.  She stated she felt fine after Rx and had no pain after Rx.      ? ?Objective impairments include Abnormal gait, cardiopulmonary status limiting activity, decreased activity tolerance, decreased balance, decreased endurance, decreased mobility, difficulty walking, and decreased strength. These impairments are limiting patient from cleaning, community activity, meal prep, shopping, and ambulation . Personal factors including 3+ comorbidities: Tachycardia, depth perception issues, lumbar arthritis, and bilat TKA  are also affecting patient's functional outcome.  ?  ?REHAB POTENTIAL: Good ?  ?CLINICAL DECISION MAKING: Evolving/moderate complexity ?  ?EVALUATION COMPLEXITY: Moderate ?  ?  ?GOALS: ?  ?SHORT TERM GOALS: ?  ?STG Name Target Date Goal status  ?1 Pt will be independent and compliant with HEP for improved strength, tolerance to activity, stability, and function.  ?Baseline:  11/22/2021 INITIAL  ?2 Pt will be able to increase her walking program by 50% without adverse effects and demo improved  tolerance to and stability with gait in the clinic.  ?Baseline:  11/29/2021 INITIAL  ?3 Pt will ambulate community distance without significant fatigue.  ?Baseline: 11/29/2021 INITIAL  ?4 Pt will be able  to stand to cook without significant difficulty or fatigue. ?Baseline: 11/29/2021 INITIAL  ?         ?         ?         ?  ?LONG TERM GOALS:  ?  ?LTG Name Target Date Goal status  ?1 Pt will be able to ambulate in grocery store to shop without significant fatigue and difficulty.  ?  Baseline: 12/13/2021 INITIAL  ?2 Pt will demo 5/5 bilat hip flex and abd strength and L knee extension strength for improved performance of and tolerance with functional mobility.  ?Baseline: 12/13/2021 INITIAL  ?3 Pt will report she is able to stand to perform her ADLs and household chores without significant fatigue or difficulty.   ?Baseline: 12/13/2021 INITIAL  ?4 Pt will be able to ambulate extended community distance without significant difficulty.  ?Baseline: 12/13/2021 INITIAL  ?5 Pt will be able to perform a reciprocal gait on the stairs with 1 rail.  ?Baseline: 12/13/2021 INITIAL  ?         ?         ?  ?PLAN: ?PT FREQUENCY: 2x/week ?  ?PT DURATION: 6 weeks ?  ?PLANNED INTERVENTIONS: Therapeutic exercises, Therapeutic activity, Neuro Muscular re-education, Balance training, Gait training, Patient/Family education, Joint mobilization, Stair training, DME instructions, Aquatic Therapy, Cryotherapy, Moist heat, Taping, and Manual therapy ?  ?PLAN FOR NEXT SESSION:  Assess HR before Rx.  Cont to monitor HR during exercises.  Cont with low intensity exercises depending on HR and response to exercises.  ? ? ?Selinda Michaels III PT, DPT ?11/28/21 9:36 PM ? ? ? ? ? ?  ? ?

## 2021-11-30 ENCOUNTER — Encounter (HOSPITAL_BASED_OUTPATIENT_CLINIC_OR_DEPARTMENT_OTHER): Payer: Self-pay | Admitting: Physical Therapy

## 2021-11-30 ENCOUNTER — Other Ambulatory Visit: Payer: Self-pay

## 2021-11-30 ENCOUNTER — Ambulatory Visit (HOSPITAL_BASED_OUTPATIENT_CLINIC_OR_DEPARTMENT_OTHER): Payer: Medicare Other | Admitting: Physical Therapy

## 2021-11-30 DIAGNOSIS — R262 Difficulty in walking, not elsewhere classified: Secondary | ICD-10-CM | POA: Diagnosis not present

## 2021-11-30 DIAGNOSIS — M6281 Muscle weakness (generalized): Secondary | ICD-10-CM | POA: Diagnosis not present

## 2021-11-30 NOTE — Therapy (Signed)
?OUTPATIENT PHYSICAL THERAPY TREATMENT NOTE ? ? ?Patient Name: HASINA KREAGER ?MRN: [id number] ?DOB:[birth year], 76 y.o., female ?Today's Date: 12/01/2021 ? ?PCP: Maury Dus, MD ? PT End of Session - 11/30/21 1528   ? ? Visit Number 5   ? Number of Visits 12   ? Date for PT Re-Evaluation 12/13/21   ? Authorization Type MCR A&B   ? PT Start Time 1524   ? PT Stop Time 0938   ? PT Time Calculation (min) 33 min   ? Activity Tolerance --   Improved tolerance, but limited due to vitals  ? Behavior During Therapy [facility] for tasks assessed/performed   ? ?  ?  ? ?  ? ? ? ? ? ? ?Past Medical History:  ?Diagnosis Date  ? Chronic kidney disease, stage 3a ([place]) 06/27/2021  ? COVID-19   ? GERD without esophagitis 06/27/2021  ? Hypertension   ? Mild intermittent asthma without complication 18/29/9371  ? Mixed hyperlipidemia 06/27/2021  ? Pneumonia   ? ?Past Surgical History:  ?Procedure Laterality Date  ? TOTAL KNEE ARTHROPLASTY    ? ?Patient Active Problem List  ? Diagnosis Date Noted  ? COVID-19 virus infection 06/27/2021  ? Acute respiratory failure with hypoxia ([place]) 06/27/2021  ? Chronic kidney disease, stage 3a ([place]) 06/27/2021  ? GERD without esophagitis 06/27/2021  ? Mixed hyperlipidemia 06/27/2021  ? Mild intermittent asthma with (acute) exacerbation 06/27/2021  ? Sepsis due to COVID-19 [facility]) 06/27/2021  ? ? ?REFERRING PROVIDER: Marshell Garfinkel, MD ?  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  ?THERAPY DIAG:  ?Muscle weakness (generalized) ?  ?Difficulty in walking, not elsewhere classified ?  ?ONSET DATE: October 2022 ?  ?SUBJECTIVE:  ?  ?SUBJECTIVE STATEMENT: ?Pt states she is feeling better.  Pt was able to walk 5 mins twice inside her home yesterday.  Pt and husband state that her HR has been less than 100 twice since prior visit.   Pt denies any adverse effects after prior Rx.  Pt bought an apple watch which can monitor if she is in A-fib and states she is not in A-fib.  Pt states she feels better.  Pt  reports her MD told her that her HR should improve with diet and exercise.  Pt's husband states her brain fog is not as evident as it was.  Pt denies pain currently.  ?Pt completed antibiotics yesterday. ?  ?PERTINENT HISTORY: ?Tachycardia, interstitial lung disease, depth perception issues, lumbar arthritis, bilat TKA in 2012, Coronary artery calcification,  HTN, Chronic kidney disease, stage 3a which she states she no longer has. ?  ?  ?  ?PRECAUTIONS: Pt has some unsteadiness with gait.  Pt's husband states she has depth perception issues.  Tachycardia ?  ?WEIGHT BEARING RESTRICTIONS No ?  ?FALLS:  ?Has patient fallen in last 6 months? Yes, Number of falls: 3-4.  Pt's husband states she has depth perception issues. ?  ? ?PLOF: Independent; Pt was able to perform her normal household chores and functional mobility skills without limitation.  Pt did not use an AD with extended ambulation.  ?  ?PATIENT GOALS improve LE and UE strength, improve ambulation distance.   ?  ?  ?OBJECTIVE:  ?  ?VITALS: ?Max HR (220 - 73) = 147 ?At beginning of Rx at rest:  111 - 120 bpm primarily, O2:  96%.  Her watch states she is not in A-fib.   Pt's HR was over 120 occasionally in sitting ?HR:  did  reach 136 during standing exercises, O2 95%.  HR improved with sitting and resting.   ?Pt's HR did reach below 110 a few times measuring 106-109 at times though increased quickly. ?At end of Rx:  111 - 122 bpm ?  ?  ?TODAY'S TREATMENT: ?-Reviewed current function and pain level.  ?-Assessed HR/O2 during Rx ?-Pt performed: ?            standing Heel/Toe raises 2x10 reps  ?            Supine clams with RTB  2x 10 reps ?            S/L Hip abduction x10 reps ?            Marching on airex 1x10 reps ?Seated clams with RTB 2x10 reps ?Seated marching 2x10 reps with RTB ?LAQ with RTB 2x10 reps ?             ? ? ?PATIENT EDUCATION:  ?Education details:  Vitals, exercise form, and POC. ?Person educated: Patient and Spouse ?Education method:  Explanation,  ?Education comprehension: verbalized understanding ? ? ?HOME EXERCISE PROGRAM: ?Access Code: [email or web address] ?URL: [email or web address] ?Date: 11/07/2021 ?Prepared by: Ronny Flurry ? ?Exercises ?Supine Active Straight Leg Raise - 1 x daily - 5-6 x weekly - 1-2 sets - 10 reps ?Hooklying Clamshell with Resistance - 1 x daily - 3-4 x weekly - 2 sets - 10 reps ?Sidelying Hip Abduction - 1 x daily - 5-6 x weekly - 1-2 sets - 10 reps ? ? ?  ?ASSESSMENT: ?  ?CLINICAL IMPRESSION: ?Pt presents to Rx stating she is feeling better.  Pt did have improved tolerance today with exercises.  Her HR reached lower levels today.  HR was closely monitored during Rx/exercises today.  When pt's HR increased, pt would sit down and rest until HR decreased.  Pt continues to have increased HR with exercises including a larger increase with standing exercises.  Pt did have an increase in HR with exercises which improved each time after sitting down and resting.  Pt's O2 sats were at a good level during Rx.  Treatment and exercises were limited today due to pt's HR.  She stated she felt fine after Rx and had no pain after Rx.      ? ?Objective impairments include Abnormal gait, cardiopulmonary status limiting activity, decreased activity tolerance, decreased balance, decreased endurance, decreased mobility, difficulty walking, and decreased strength. These impairments are limiting patient from cleaning, community activity, meal prep, shopping, and ambulation . Personal factors including 3+ comorbidities: Tachycardia, depth perception issues, lumbar arthritis, and bilat TKA  are also affecting patient's functional outcome.  ?  ?REHAB POTENTIAL: Good ?  ?CLINICAL DECISION MAKING: Evolving/moderate complexity ?  ?EVALUATION COMPLEXITY: Moderate ?  ?  ?GOALS: ?  ?SHORT TERM GOALS: ?  ?STG Name Target Date Goal status  ?1 Pt will be independent and compliant with HEP for improved strength, tolerance to activity, stability, and  function.  ?Baseline:  11/22/2021 INITIAL  ?2 Pt will be able to increase her walking program by 50% without adverse effects and demo improved  tolerance to and stability with gait in the clinic.  ?Baseline:  11/29/2021 INITIAL  ?3 Pt will ambulate community distance without significant fatigue.  ?Baseline: 11/29/2021 INITIAL  ?4 Pt will be able to stand to cook without significant difficulty or fatigue. ?Baseline: 11/29/2021 INITIAL  ?         ?         ?         ?  ?  LONG TERM GOALS:  ?  ?LTG Name Target Date Goal status  ?1 Pt will be able to ambulate in grocery store to shop without significant fatigue and difficulty.  ?Baseline: 12/13/2021 INITIAL  ?2 Pt will demo 5/5 bilat hip flex and abd strength and L knee extension strength for improved performance of and tolerance with functional mobility.  ?Baseline: 12/13/2021 INITIAL  ?3 Pt will report she is able to stand to perform her ADLs and household chores without significant fatigue or difficulty.   ?Baseline: 12/13/2021 INITIAL  ?4 Pt will be able to ambulate extended community distance without significant difficulty.  ?Baseline: 12/13/2021 INITIAL  ?5 Pt will be able to perform a reciprocal gait on the stairs with 1 rail.  ?Baseline: 12/13/2021 INITIAL  ?         ?         ?  ?PLAN: ?PT FREQUENCY: 2x/week ?  ?PT DURATION: 6 weeks ?  ?PLANNED INTERVENTIONS: Therapeutic exercises, Therapeutic activity, Neuro Muscular re-education, Balance training, Gait training, Patient/Family education, Joint mobilization, Stair training, DME instructions, Aquatic Therapy, Cryotherapy, Moist heat, Taping, and Manual therapy ?  ?PLAN FOR NEXT SESSION:  Assess HR before Rx.  Cont to closely monitor HR during exercises.  Cont with low intensity exercises depending on HR and response to exercises.  ? ? ?Selinda Michaels III PT, DPT ?12/01/21 6:45 PM ? ? ? ? ? ? ?  ? ?

## 2021-12-05 ENCOUNTER — Ambulatory Visit (HOSPITAL_BASED_OUTPATIENT_CLINIC_OR_DEPARTMENT_OTHER): Payer: Medicare Other | Admitting: Physical Therapy

## 2021-12-05 ENCOUNTER — Other Ambulatory Visit: Payer: Self-pay

## 2021-12-05 DIAGNOSIS — M6281 Muscle weakness (generalized): Secondary | ICD-10-CM | POA: Diagnosis not present

## 2021-12-05 DIAGNOSIS — R262 Difficulty in walking, not elsewhere classified: Secondary | ICD-10-CM | POA: Diagnosis not present

## 2021-12-05 NOTE — Therapy (Signed)
?OUTPATIENT PHYSICAL THERAPY TREATMENT NOTE ? ? ?Patient Name: Tracy Young ?MRN: [id number] ?DOB:August 06, 1946, 76 y.o., female ?Today's Date: 12/06/2021 ? ?PCP: Maury Dus, MD ? PT End of Session - 12/05/21 1610   ? ? Visit Number 6   ? Number of Visits 12   ? Date for PT Re-Evaluation 12/13/21   ? Authorization Type MCR A&B   ? PT Start Time 1530   ? PT Stop Time 5462   ? PT Time Calculation (min) 34 min   ? Activity Tolerance --   Improved tolerance, but limited due to vitals  ? Behavior During Therapy [facility] for tasks assessed/performed   ? ?  ?  ? ?  ? ? ? ? ? ? ? ?Past Medical History:  ?Diagnosis Date  ? Chronic kidney disease, stage 3a ([place]) 06/27/2021  ? COVID-19   ? GERD without esophagitis 06/27/2021  ? Hypertension   ? Mild intermittent asthma without complication 70/35/0093  ? Mixed hyperlipidemia 06/27/2021  ? Pneumonia   ? ?Past Surgical History:  ?Procedure Laterality Date  ? TOTAL KNEE ARTHROPLASTY    ? ?Patient Active Problem List  ? Diagnosis Date Noted  ? COVID-19 virus infection 06/27/2021  ? Acute respiratory failure with hypoxia ([place]) 06/27/2021  ? Chronic kidney disease, stage 3a ([place]) 06/27/2021  ? GERD without esophagitis 06/27/2021  ? Mixed hyperlipidemia 06/27/2021  ? Mild intermittent asthma with (acute) exacerbation 06/27/2021  ? Sepsis due to COVID-19 [facility]) 06/27/2021  ? ? ?REFERRING PROVIDER: Marshell Garfinkel, MD ?  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  ?THERAPY DIAG:  ?Muscle weakness (generalized) ?  ?Difficulty in walking, not elsewhere classified ?  ?ONSET DATE: October 2022 ?  ?SUBJECTIVE:  ?  ?SUBJECTIVE STATEMENT: ?Pt states she is feeling better.  Pt was able to walk 5 mins twice inside her home yesterday.  Pt and husband state that her HR has been less than 100 twice since prior visit.   Pt denies any adverse effects after prior Rx.  Pt bought an apple watch which can monitor if she is in A-fib and states she is not in A-fib.  Pt states she feels better.   Pt reports her MD told her that her HR should improve with diet and exercise.  Pt's husband states her brain fog is not as evident as it was.  Pt denies pain currently.  ?Pt completed antibiotics yesterday. ? ?Pt states she felt tired after prior Rx and a little sluggish the following day.  Pt states she felt good on Saturday and went to the movies.  Pt went to [facility] on Sunday but only able to stay for Sunday School.  Pt walked in her home everyday except Saturday.   ? ?PERTINENT HISTORY: ?Tachycardia, interstitial lung disease, depth perception issues, lumbar arthritis, bilat TKA in 2012, Coronary artery calcification,  HTN, Chronic kidney disease, stage 3a which she states she no longer has. ?  ?  ?  ?PRECAUTIONS: Pt has some unsteadiness with gait.  Pt's husband states she has depth perception issues.  Tachycardia ?  ?WEIGHT BEARING RESTRICTIONS No ?  ?FALLS:  ?Has patient fallen in last 6 months? Yes, Number of falls: 3-4.  Pt's husband states she has depth perception issues. ?  ? ?PLOF: Independent; Pt was able to perform her normal household chores and functional mobility skills without limitation.  Pt did not use an AD with extended ambulation.  ?  ?PATIENT GOALS improve LE and UE strength, improve ambulation distance.   ?  ?  ?  OBJECTIVE:  ?  ?VITALS: ?Max HR (220 - 73) = 147 ?At beginning of Rx at rest:  115 - 121 bpm primarily, O2:  96%.  Her watch states she is not in A-fib.   Pt's HR was over 120 occasionally in sitting ?HR:  124 during Nu-step ? ?did reach 136 after standing exercises.  HR improved with sitting and resting.   ? ?At end of Rx:  116 - 122 bpm ?  ?  ?TODAY'S TREATMENT: ?-Reviewed current function, HEP compliance, and pain level.  ?-Assessed HR/O2 during Rx ?-Pt performed: ?            standing Heel/Toe raises 2x10 reps  ?            Marching on airex 2x10 reps ?Seated clams with RTB 2x10 reps ?Seated marching 2x10 reps with RTB ?LAQ with RTB 2x10 reps ?             ? ? ?PATIENT EDUCATION:   ?Education details:  Vitals, exercise form, and POC. ?Person educated: Patient and Spouse ?Education method: Explanation,  ?Education comprehension: verbalized understanding ? ? ?HOME EXERCISE PROGRAM: ?Access Code: [email or web address] ?URL: [email or web address] ?Date: 11/07/2021 ?Prepared by: Ronny Flurry ? ?Exercises ?Supine Active Straight Leg Raise - 1 x daily - 5-6 x weekly - 1-2 sets - 10 reps ?Hooklying Clamshell with Resistance - 1 x daily - 3-4 x weekly - 2 sets - 10 reps ?Sidelying Hip Abduction - 1 x daily - 5-6 x weekly - 1-2 sets - 10 reps ? ? ?  ?ASSESSMENT: ?  ?CLINICAL IMPRESSION: ?Pt is improving with tolerance to exercises though continues to be limited due to increased HR.  Pt has also been walking more in home.  Pt continues to have a high resting HR which increases more with standing exercises.  Pt didn't have a large increase in HR with Nu-step though it was only at level 1.  HR was closely monitored during Rx/exercises today.  When pt's HR increased, pt would sit down and rest until HR decreased.    She stated she felt fine after Rx and had no pain after Rx.      ? ?Objective impairments include Abnormal gait, cardiopulmonary status limiting activity, decreased activity tolerance, decreased balance, decreased endurance, decreased mobility, difficulty walking, and decreased strength. These impairments are limiting patient from cleaning, community activity, meal prep, shopping, and ambulation . Personal factors including 3+ comorbidities: Tachycardia, depth perception issues, lumbar arthritis, and bilat TKA  are also affecting patient's functional outcome.  ?  ?REHAB POTENTIAL: Good ?  ?CLINICAL DECISION MAKING: Evolving/moderate complexity ?  ?EVALUATION COMPLEXITY: Moderate ?  ?  ?GOALS: ?  ?SHORT TERM GOALS: ?  ?STG Name Target Date Goal status  ?1 Pt will be independent and compliant with HEP for improved strength, tolerance to activity, stability, and function.  ?Baseline:  11/22/2021  INITIAL  ?2 Pt will be able to increase her walking program by 50% without adverse effects and demo improved  tolerance to and stability with gait in the clinic.  ?Baseline:  11/29/2021 INITIAL  ?3 Pt will ambulate community distance without significant fatigue.  ?Baseline: 11/29/2021 INITIAL  ?4 Pt will be able to stand to cook without significant difficulty or fatigue. ?Baseline: 11/29/2021 INITIAL  ?         ?         ?         ?  ?LONG TERM GOALS:  ?  ?LTG Name Target Date Goal  status  ?1 Pt will be able to ambulate in grocery store to shop without significant fatigue and difficulty.  ?Baseline: 12/13/2021 INITIAL  ?2 Pt will demo 5/5 bilat hip flex and abd strength and L knee extension strength for improved performance of and tolerance with functional mobility.  ?Baseline: 12/13/2021 INITIAL  ?3 Pt will report she is able to stand to perform her ADLs and household chores without significant fatigue or difficulty.   ?Baseline: 12/13/2021 INITIAL  ?4 Pt will be able to ambulate extended community distance without significant difficulty.  ?Baseline: 12/13/2021 INITIAL  ?5 Pt will be able to perform a reciprocal gait on the stairs with 1 rail.  ?Baseline: 12/13/2021 INITIAL  ?         ?         ?  ?PLAN: ?PT FREQUENCY: 2x/week ?  ?PT DURATION: 6 weeks ?  ?PLANNED INTERVENTIONS: Therapeutic exercises, Therapeutic activity, Neuro Muscular re-education, Balance training, Gait training, Patient/Family education, Joint mobilization, Stair training, DME instructions, Aquatic Therapy, Cryotherapy, Moist heat, Taping, and Manual therapy ?  ?PLAN FOR NEXT SESSION:  Assess HR before Rx.  Cont to closely monitor HR during exercises.  Cont with low intensity exercises depending on HR and response to exercises.  ? ? ?Selinda Michaels III PT, DPT ?12/06/21 9:56 PM ? ? ? ? ? ? ? ?  ? ?

## 2021-12-06 ENCOUNTER — Encounter (HOSPITAL_BASED_OUTPATIENT_CLINIC_OR_DEPARTMENT_OTHER): Payer: Self-pay | Admitting: Physical Therapy

## 2021-12-07 ENCOUNTER — Ambulatory Visit (HOSPITAL_BASED_OUTPATIENT_CLINIC_OR_DEPARTMENT_OTHER): Payer: Medicare Other | Admitting: Physical Therapy

## 2021-12-07 ENCOUNTER — Other Ambulatory Visit: Payer: Self-pay

## 2021-12-07 DIAGNOSIS — M6281 Muscle weakness (generalized): Secondary | ICD-10-CM | POA: Diagnosis not present

## 2021-12-07 DIAGNOSIS — R262 Difficulty in walking, not elsewhere classified: Secondary | ICD-10-CM

## 2021-12-07 NOTE — Therapy (Signed)
?OUTPATIENT PHYSICAL THERAPY TREATMENT NOTE ? ? ?Patient Name: Tracy Young ?MRN: [id number] ?DOB:[birth year], 76 y.o., female ?Today's Date: 12/08/2021 ? ?PCP: Maury Dus, MD ? PT End of Session - 12/07/21 1534   ? ? Visit Number 7   ? Number of Visits 12   ? Date for PT Re-Evaluation 12/13/21   ? Authorization Type MCR A&B   ? Progress Note Due on Visit 10   ? PT Start Time 1525   ? PT Stop Time 4034   ? PT Time Calculation (min) 39 min   ? Activity Tolerance --   improved tolerance but limited due to vitals  ? Behavior During Therapy [facility] for tasks assessed/performed   ? ?  ?  ? ?  ? ? ? ? ? ? ? ?Past Medical History:  ?Diagnosis Date  ? Chronic kidney disease, stage 3a ([place]) 06/27/2021  ? COVID-19   ? GERD without esophagitis 06/27/2021  ? Hypertension   ? Mild intermittent asthma without complication 74/25/9563  ? Mixed hyperlipidemia 06/27/2021  ? Pneumonia   ? ?Past Surgical History:  ?Procedure Laterality Date  ? TOTAL KNEE ARTHROPLASTY    ? ?Patient Active Problem List  ? Diagnosis Date Noted  ? COVID-19 virus infection 06/27/2021  ? Acute respiratory failure with hypoxia ([place]) 06/27/2021  ? Chronic kidney disease, stage 3a ([place]) 06/27/2021  ? GERD without esophagitis 06/27/2021  ? Mixed hyperlipidemia 06/27/2021  ? Mild intermittent asthma with (acute) exacerbation 06/27/2021  ? Sepsis due to COVID-19 [facility]) 06/27/2021  ? ? ?REFERRING PROVIDER: Marshell Garfinkel, MD ?  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  ?THERAPY DIAG:  ?Muscle weakness (generalized) ?  ?Difficulty in walking, not elsewhere classified ?  ?ONSET DATE: October 2022 ?  ?SUBJECTIVE:  ?  ?SUBJECTIVE STATEMENT: ?Pt states she felt good on Saturday and went to the movies.  Pt went to [facility] on Sunday but only able to stay for Sunday School.  Pt walked in her home everyday except Saturday.   ?Pt reports she felt fine after prior Rx.  Pt states "I had a good day yesterday".  Pt reports she had difficulty getting OOB this AM  but felt better after eating and showering.  Pt states she received a 90 minute massage yesterday and also received cupping during massage.  She states the sensation on the bottom of her feet is so much better since the cupping.   ? ?PERTINENT HISTORY: ?Tachycardia, interstitial lung disease, depth perception issues, lumbar arthritis, bilat TKA in 2012, Coronary artery calcification,  HTN, Chronic kidney disease, stage 3a which she states she no longer has. ?  ?  ?  ?PRECAUTIONS: Pt has some unsteadiness with gait.  Pt's husband states she has depth perception issues.  Tachycardia ?  ?WEIGHT BEARING RESTRICTIONS No ?  ?FALLS:  ?Has patient fallen in last 6 months? Yes, Number of falls: 3-4.  Pt's husband states she has depth perception issues. ?  ? ?PLOF: Independent; Pt was able to perform her normal household chores and functional mobility skills without limitation.  Pt did not use an AD with extended ambulation.  ?  ?PATIENT GOALS improve LE and UE strength, improve ambulation distance.   ?  ?  ?OBJECTIVE:  ?  ?VITALS: ?Max HR (220 - 73) = 147 ?At beginning of Rx at rest:  105 - 119 bpm primarily, O2:  95-97%.  Pt's HR was over 120 occasionally in sitting ?HR:  113-122, O2:  93% after Nu-step ?HR:  125-133, O2:  93% after standing heel/toe raises ?HR:  134, O2:  89-90% after sidestepping which improved to 115-120 and 96% respectively after resting ?HR:  133, O2: 89% after step ups on airex which improved with sitting and resting ? ? ?TODAY'S TREATMENT: ?-Reviewed current function, HEP compliance, and pain level.  ?-Assessed HR/O2 during Rx ?-Pt performed: ?            standing Heel/Toe raises 2x10 reps  ?            Marching on airex 2x10 reps ?Seated clams with RTB 2x10 reps ?Sidestepping at rail x 2 laps with break between reps ?LAQ with RTB 2x10 reps ?Tandem gait at rail x 1 rep with UE support ?Step ups on airex x 10 reps each with 1 UE assist lightly ?             ? ? ?PATIENT EDUCATION:  ?Education details:   Vitals, exercise form, and POC. ?Person educated: Patient and Spouse ?Education method: Explanation,  ?Education comprehension: verbalized understanding ? ? ?HOME EXERCISE PROGRAM: ?Access Code: [email or web address] ?URL: [email or web address] ?Date: 11/07/2021 ?Prepared by: Ronny Flurry ? ?Exercises ?Supine Active Straight Leg Raise - 1 x daily - 5-6 x weekly - 1-2 sets - 10 reps ?Hooklying Clamshell with Resistance - 1 x daily - 3-4 x weekly - 2 sets - 10 reps ?Sidelying Hip Abduction - 1 x daily - 5-6 x weekly - 1-2 sets - 10 reps ? ? ?  ?ASSESSMENT: ?  ?CLINICAL IMPRESSION: ?Pt is improving with tolerance to exercises though continues to be limited due to increased HR.  Her HR is slowly improving including her resting HR. Pt has been walking more in home.  PT did increase exercise intensity with increased exercises including standing exercises.  Pt tolerated increased exercise intensity well.  Pt's HR increased with exercises especially more with standing exercises.  When pt's HR increased, pt would sit down and rest until HR decreased.    She stated she felt fine after Rx and had no pain after Rx.   Pt should continue to benefit from cont skilled PT services to address goals, improve tolerance to exercises, and to assist in restoring desired level of function.  ? ? ?Objective impairments include Abnormal gait, cardiopulmonary status limiting activity, decreased activity tolerance, decreased balance, decreased endurance, decreased mobility, difficulty walking, and decreased strength. These impairments are limiting patient from cleaning, community activity, meal prep, shopping, and ambulation . Personal factors including 3+ comorbidities: Tachycardia, depth perception issues, lumbar arthritis, and bilat TKA  are also affecting patient's functional outcome.  ?  ?REHAB POTENTIAL: Good ?  ?CLINICAL DECISION MAKING: Evolving/moderate complexity ?  ?EVALUATION COMPLEXITY: Moderate ?  ?  ?GOALS: ?  ?SHORT TERM  GOALS: ?  ?STG Name Target Date Goal status  ?1 Pt will be independent and compliant with HEP for improved strength, tolerance to activity, stability, and function.  ?Baseline:  11/22/2021 INITIAL  ?2 Pt will be able to increase her walking program by 50% without adverse effects and demo improved  tolerance to and stability with gait in the clinic.  ?Baseline:  11/29/2021 INITIAL  ?3 Pt will ambulate community distance without significant fatigue.  ?Baseline: 11/29/2021 INITIAL  ?4 Pt will be able to stand to cook without significant difficulty or fatigue. ?Baseline: 11/29/2021 INITIAL  ?         ?         ?         ?  ?  LONG TERM GOALS:  ?  ?LTG Name Target Date Goal status  ?1 Pt will be able to ambulate in grocery store to shop without significant fatigue and difficulty.  ?Baseline: 12/13/2021 INITIAL  ?2 Pt will demo 5/5 bilat hip flex and abd strength and L knee extension strength for improved performance of and tolerance with functional mobility.  ?Baseline: 12/13/2021 INITIAL  ?3 Pt will report she is able to stand to perform her ADLs and household chores without significant fatigue or difficulty.   ?Baseline: 12/13/2021 INITIAL  ?4 Pt will be able to ambulate extended community distance without significant difficulty.  ?Baseline: 12/13/2021 INITIAL  ?5 Pt will be able to perform a reciprocal gait on the stairs with 1 rail.  ?Baseline: 12/13/2021 INITIAL  ?         ?         ?  ?PLAN: ?PT FREQUENCY: 2x/week ?  ?PT DURATION: 6 weeks ?  ?PLANNED INTERVENTIONS: Therapeutic exercises, Therapeutic activity, Neuro Muscular re-education, Balance training, Gait training, Patient/Family education, Joint mobilization, Stair training, DME instructions, Aquatic Therapy, Cryotherapy, Moist heat, Taping, and Manual therapy ?  ?PLAN FOR NEXT SESSION:  Assess HR before Rx.  Cont to closely monitor HR during exercises.  Slowly progress exercises for improved strength, endurance, and standing depending on HR and response to exercises.   ? ? ?Selinda Michaels III PT, DPT ?12/08/21 8:04 PM ? ? ? ? ? ? ? ? ?  ? ?

## 2021-12-08 ENCOUNTER — Other Ambulatory Visit: Payer: Self-pay | Admitting: Family Medicine

## 2021-12-08 ENCOUNTER — Encounter (HOSPITAL_BASED_OUTPATIENT_CLINIC_OR_DEPARTMENT_OTHER): Payer: Self-pay | Admitting: Physical Therapy

## 2021-12-08 DIAGNOSIS — M5412 Radiculopathy, cervical region: Secondary | ICD-10-CM | POA: Diagnosis not present

## 2021-12-08 DIAGNOSIS — M5417 Radiculopathy, lumbosacral region: Secondary | ICD-10-CM | POA: Diagnosis not present

## 2021-12-08 DIAGNOSIS — G603 Idiopathic progressive neuropathy: Secondary | ICD-10-CM | POA: Diagnosis not present

## 2021-12-08 DIAGNOSIS — Z1231 Encounter for screening mammogram for malignant neoplasm of breast: Secondary | ICD-10-CM

## 2021-12-12 ENCOUNTER — Other Ambulatory Visit: Payer: Self-pay

## 2021-12-12 ENCOUNTER — Ambulatory Visit (HOSPITAL_BASED_OUTPATIENT_CLINIC_OR_DEPARTMENT_OTHER): Payer: Medicare Other | Admitting: Physical Therapy

## 2021-12-12 DIAGNOSIS — R262 Difficulty in walking, not elsewhere classified: Secondary | ICD-10-CM | POA: Diagnosis not present

## 2021-12-12 DIAGNOSIS — M6281 Muscle weakness (generalized): Secondary | ICD-10-CM

## 2021-12-12 NOTE — Therapy (Signed)
?OUTPATIENT PHYSICAL THERAPY TREATMENT NOTE ? ? ?Patient Name: Tracy Young ?MRN: [id number] ?DOB:29-Sep-1945, 76 y.o., female ?Today's Date: 12/13/2021 ? ?PCP: Maury Dus, MD ? PT End of Session - 12/12/21 1617   ? ? Visit Number 8   ? Number of Visits 12   ? Date for PT Re-Evaluation 12/13/21   ? Authorization Type MCR A&B   ? Progress Note Due on Visit 10   ? PT Start Time 1525   ? PT Stop Time 4196   ? PT Time Calculation (min) 43 min   ? Activity Tolerance --   improved tolerance but limited to vitals  ? Behavior During Therapy [facility] for tasks assessed/performed   ? ?  ?  ? ?  ? ? ? ? ? ? ? ? ?Past Medical History:  ?Diagnosis Date  ? Chronic kidney disease, stage 3a ([place]) 06/27/2021  ? COVID-19   ? GERD without esophagitis 06/27/2021  ? Hypertension   ? Mild intermittent asthma without complication 22/29/7989  ? Mixed hyperlipidemia 06/27/2021  ? Pneumonia   ? ?Past Surgical History:  ?Procedure Laterality Date  ? TOTAL KNEE ARTHROPLASTY    ? ?Patient Active Problem List  ? Diagnosis Date Noted  ? COVID-19 virus infection 06/27/2021  ? Acute respiratory failure with hypoxia ([place]) 06/27/2021  ? Chronic kidney disease, stage 3a ([place]) 06/27/2021  ? GERD without esophagitis 06/27/2021  ? Mixed hyperlipidemia 06/27/2021  ? Mild intermittent asthma with (acute) exacerbation 06/27/2021  ? Sepsis due to COVID-19 [facility]) 06/27/2021  ? ? ?REFERRING PROVIDER: Marshell Garfinkel, MD ?  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  ?THERAPY DIAG:  ?Muscle weakness (generalized) ?  ?Difficulty in walking, not elsewhere classified ?  ?ONSET DATE: October 2022 ?  ?SUBJECTIVE:  ?  ?SUBJECTIVE STATEMENT: ?Pt states she felt good on Saturday and went to the movies.  Pt went to [facility] on Sunday but only able to stay for Sunday School.  Pt walked in her home everyday except Saturday.   ?Pt reports she felt fine after prior Rx.  Pt states "I had a good day yesterday".  Pt reports she had difficulty getting OOB this AM but  felt better after eating and showering.  Pt states she received a 90 minute massage yesterday and also received cupping during massage.  She states the sensation on the bottom of her feet is so much better since the cupping.   ? ?Pt reports she walked 5 mins 4 times today.  Pt has done standing exercises this AM including marching, heel raises, and squats.  Pt's husband states that she has a couple of good days and a couple of bad days.  Pt was exhausted after church yesterday.  Pt had no adverse effects after prior Rx.  She was a little shaky though recovered quickly.  Pt becomes very fatigued with showering.  Pt's resting HR has been below 100 at night watching TV.  ? ?PERTINENT HISTORY: ?Tachycardia, interstitial lung disease, depth perception issues, lumbar arthritis, bilat TKA in 2012, Coronary artery calcification,  HTN, Chronic kidney disease, stage 3a which she states she no longer has. ?  ?  ?  ?PRECAUTIONS: Pt has some unsteadiness with gait.  Pt's husband states she has depth perception issues.  Tachycardia ?  ?WEIGHT BEARING RESTRICTIONS No ?  ?FALLS:  ?Has patient fallen in last 6 months? Yes, Number of falls: 3-4.  Pt's husband states she has depth perception issues. ?  ? ?PLOF: Independent; Pt was  able to perform her normal household chores and functional mobility skills without limitation.  Pt did not use an AD with extended ambulation.  ?  ?PATIENT GOALS improve LE and UE strength, improve ambulation distance.   ?  ?  ?OBJECTIVE:  ?  ?VITALS: ?Max HR (220 - 73) = 147 ?At beginning of Rx at rest: 119-120 bpm primarily, O2:  95%.   ?HR:  125, O2:  96% after Nu-step ?HR:  128,   after seated exercise  ?HR:  130-133, O2:  92-93% after standing heel/toe raises ?HR:  133-136, O2:  91-93% after sidestepping which improved after sitting and resting ?At end of Rx:  95% ? ? ?TODAY'S TREATMENT: ?-Reviewed current function, HEP compliance, and pain level.  ?-Assessed HR/O2 during Rx ?-Pt performed: ?             Nustep with UE/LE x 5 mins ?standing Heel/Toe raises 2x10 reps  ?            Marching on airex 2x10 reps ?Seated clams with GTB 2x10 reps ?LAQ with GTB 2x10 reps ?Tandem gait at rail x 1 rep with UE support ? ?Therapeutic Activity: ?Squats 2x10 reps ?Step ups on airex x 10 reps each with 1 UE assist lightly ?Sidestepping at rail x 2 laps with break between reps ? ?             ? ? ?PATIENT EDUCATION:  ?Education details:  Updated HEP and gave pt a handout. Vitals, exercise form, and POC. ?Person educated: Patient and Spouse ?Education method: Explanation,  ?Education comprehension: verbalized understanding ? ? ?HOME EXERCISE PROGRAM: ?Access Code: [email or web address] ?URL: [email or web address] ?Date: 11/07/2021 ?Prepared by: Ronny Flurry ? ?Exercises ?Supine Active Straight Leg Raise - 1 x daily - 5-6 x weekly - 1-2 sets - 10 reps ?Hooklying Clamshell with Resistance - 1 x daily - 3-4 x weekly - 2 sets - 10 reps ?Sidelying Hip Abduction - 1 x daily - 5-6 x weekly - 1-2 sets - 10 reps ? ?- Seated Knee Extension with Resistance  - 1 x daily - 3-4 x weekly - 2 sets - 10 reps ?- Standing Heel Raise with Support  - 1 x daily - 5-6 x weekly - 2 sets - 10 reps ?- Standing Toe Raises at Chair  - 1 x daily - 5-6 x weekly - 2 sets - 10 reps ? ?  ?ASSESSMENT: ?  ?CLINICAL IMPRESSION: ?Pt is improving with tolerance to exercises though continues to be limited due to increased HR.  Pt's HR was more consistent and steady today.  PT progressed intensity with increased resistance and increased standing exercises.  Pt able to perform exercises well.  Pt did have increased HR especially with standing exercises though overall improving.  Pt's HR decreased and O2 stats increased when sitting down and resting.  Pt has been walking more in home.  She stated she felt fine after Rx and had no c/o'safter Rx.   Pt should continue to benefit from cont skilled PT services to address goals, improve tolerance to exercises, and to assist in  restoring desired level of function.  ? ? ?Objective impairments include Abnormal gait, cardiopulmonary status limiting activity, decreased activity tolerance, decreased balance, decreased endurance, decreased mobility, difficulty walking, and decreased strength. These impairments are limiting patient from cleaning, community activity, meal prep, shopping, and ambulation . Personal factors including 3+ comorbidities: Tachycardia, depth perception issues, lumbar arthritis, and bilat TKA  are also affecting patient's functional outcome.  ?  ?  REHAB POTENTIAL: Good ?  ?CLINICAL DECISION MAKING: Evolving/moderate complexity ?  ?EVALUATION COMPLEXITY: Moderate ?  ?  ?GOALS: ?  ?SHORT TERM GOALS: ?  ?STG Name Target Date Goal status  ?1 Pt will be independent and compliant with HEP for improved strength, tolerance to activity, stability, and function.  ?Baseline:  11/22/2021 INITIAL  ?2 Pt will be able to increase her walking program by 50% without adverse effects and demo improved  tolerance to and stability with gait in the clinic.  ?Baseline:  11/29/2021 INITIAL  ?3 Pt will ambulate community distance without significant fatigue.  ?Baseline: 11/29/2021 INITIAL  ?4 Pt will be able to stand to cook without significant difficulty or fatigue. ?Baseline: 11/29/2021 INITIAL  ?         ?         ?         ?  ?LONG TERM GOALS:  ?  ?LTG Name Target Date Goal status  ?1 Pt will be able to ambulate in grocery store to shop without significant fatigue and difficulty.  ?Baseline: 12/13/2021 INITIAL  ?2 Pt will demo 5/5 bilat hip flex and abd strength and L knee extension strength for improved performance of and tolerance with functional mobility.  ?Baseline: 12/13/2021 INITIAL  ?3 Pt will report she is able to stand to perform her ADLs and household chores without significant fatigue or difficulty.   ?Baseline: 12/13/2021 INITIAL  ?4 Pt will be able to ambulate extended community distance without significant difficulty.  ?Baseline:  12/13/2021 INITIAL  ?5 Pt will be able to perform a reciprocal gait on the stairs with 1 rail.  ?Baseline: 12/13/2021 INITIAL  ?         ?         ?  ?PLAN: ?PT FREQUENCY: 2x/week ?  ?PT DURATION: 6 weeks ?

## 2021-12-13 ENCOUNTER — Encounter (HOSPITAL_BASED_OUTPATIENT_CLINIC_OR_DEPARTMENT_OTHER): Payer: Self-pay | Admitting: Physical Therapy

## 2021-12-14 ENCOUNTER — Ambulatory Visit (HOSPITAL_BASED_OUTPATIENT_CLINIC_OR_DEPARTMENT_OTHER): Payer: Medicare Other | Admitting: Physical Therapy

## 2021-12-14 DIAGNOSIS — M6281 Muscle weakness (generalized): Secondary | ICD-10-CM

## 2021-12-14 DIAGNOSIS — R262 Difficulty in walking, not elsewhere classified: Secondary | ICD-10-CM | POA: Diagnosis not present

## 2021-12-14 NOTE — Therapy (Signed)
?OUTPATIENT PHYSICAL THERAPY TREATMENT NOTE / RECERT ? ?Progress Note ?Reporting Period 11/01/2021 to 12/14/2021 ? ?See note below for Objective Data and Assessment of Progress/Goals.  ? ? ? ? ? ?Patient Name: Tracy Young ?MRN: [id number] ?DOB:[birth year], 76 y.o., female ?Today's Date: 12/15/2021 ? ?PCP: Maury Dus, MD ? PT End of Session - 12/14/21 1535   ? ? Visit Number 9   ? Number of Visits 21   ? Date for PT Re-Evaluation 01/25/22   ? Authorization Type MCR A&B   ? PT Start Time 0459   ? PT Stop Time 1610   ? PT Time Calculation (min) 43 min   ? Activity Tolerance Patient tolerated treatment well   ? Behavior During Therapy [facility] for tasks assessed/performed   ? ?  ?  ? ?  ? ? ? ? ? ? ? ? ?Past Medical History:  ?Diagnosis Date  ? Chronic kidney disease, stage 3a ([place]) 06/27/2021  ? COVID-19   ? GERD without esophagitis 06/27/2021  ? Hypertension   ? Mild intermittent asthma without complication 97/74/1423  ? Mixed hyperlipidemia 06/27/2021  ? Pneumonia   ? ?Past Surgical History:  ?Procedure Laterality Date  ? TOTAL KNEE ARTHROPLASTY    ? ?Patient Active Problem List  ? Diagnosis Date Noted  ? COVID-19 virus infection 06/27/2021  ? Acute respiratory failure with hypoxia ([place]) 06/27/2021  ? Chronic kidney disease, stage 3a ([place]) 06/27/2021  ? GERD without esophagitis 06/27/2021  ? Mixed hyperlipidemia 06/27/2021  ? Mild intermittent asthma with (acute) exacerbation 06/27/2021  ? Sepsis due to COVID-19 [facility]) 06/27/2021  ? ? ?REFERRING PROVIDER: Marshell Garfinkel, MD ?  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  ?THERAPY DIAG:  ?Muscle weakness (generalized) ?  ?Difficulty in walking, not elsewhere classified ?  ?ONSET DATE: October 2022 ?  ?SUBJECTIVE:  ?  ?SUBJECTIVE STATEMENT: ?"I feel great today".  Pt states she was able to ambulate 23 mins with 5 minute increments today which is 1/2 mile.  Pt's resting HR has been below 100 at night watching TV.   Pt's husband states that she has a couple  of good days and a couple of bad days.  Pt reports she felt fine after prior Rx.   ? ?FUNCTIONAL IMPROVEMENTS:  balance including with ambulating to B/R in the middle of the night, ambulation distance.  Getting OOB.  Pt able stand longer duration to cook.  Showering. ?FUNCTIONAL LIMITATIONS:  tolerance to activity.  fatigued with showering.  Fatigued after church.  Ambulation.  Household chores ? ?   ?     ? ?PERTINENT HISTORY: ?Tachycardia, interstitial lung disease, depth perception issues, lumbar arthritis, bilat TKA in 2012, Coronary artery calcification,  HTN, Chronic kidney disease, stage 3a which she states she no longer has. ?  ?  ?  ?PRECAUTIONS: Pt has some unsteadiness with gait.  Pt's husband states she has depth perception issues.  Tachycardia ?  ?WEIGHT BEARING RESTRICTIONS No ?  ?FALLS:  ?Has patient fallen in last 6 months? Yes, Number of falls: 3-4.  Pt's husband states she has depth perception issues. ?  ? ?PLOF: Independent; Pt was able to perform her normal household chores and functional mobility skills without limitation.  Pt did not use an AD with extended ambulation.  ?  ?PATIENT GOALS improve LE and UE strength, improve ambulation distance.   ?  ?  ?OBJECTIVE:  ?  ?TODAY'S TREATMENT: ? ?VITALS: ?Max HR (220 - 73) = 147 ? ?At  beginning of Rx at rest: 107-110 bpm , O2:  97%.   ?HR:  117-119, O2:  92% after Nu-step ?HR:  128,   after seated exercise  ?HR:  128, O2:  92% after assessing gait  ? ?At end of Rx: HR:  112, O2: 97% after siting and resting ?PHYSICAL PERFORMANCE TESTING: ? ?-Reviewed current function, reported functional progress/deficits, HEP compliance, and pain level.  ? ?PATIENT SURVEYS:  ? ?FOTO 46 with a goal of 65 at visit#10 ? ? ?LE/UE MMT: ?  ?MMT Right ?11/01/2021 Left ?11/01/2021  ?Hip flexion 5/5 4+/5  ?Hip extension      ?Hip abduction 4+/5 4+/5  ?Hip adduction      ?Hip internal rotation      ?Hip external rotation 5/5 5/5  ?Knee flexion 5/5 tested in sitting 5/5 tested in  sitting  ?Knee extension 5/5 5/5  ?Ankle dorsiflexion 5/5 5/5  ?Ankle plantarflexion WFL tested in sitting WFL tested in sitting  ?Shoulder Flex 5/5 5/5  ?Shoulder Abd 4+/5 5/5  ? (Blank rows = not tested) ? ?FUNCTIONAL TESTS:  ?5 times sit to stand: 11 seconds without UEs  ? ?GAIT: ?Comments: Pt ambulates with a heel to toe gait with reciprocal arm swing.  Pt has improved gait speed.  She has reduced sway and increased stability with gait.  Pt has minimally decreased foot clearance which has improved.  She was a little unsteady with turning and used the wall for support on one occasion.  ?   ? ? ? ?-Assessed HR/O2 during Rx ?-Pt performed: ?            Nustep with UE/LE x 7 mins at L3 with 3 bouts of 20 second intervals ?            Marching on airex 2x10 reps ?Squats 2x10 reps ?             ? ? ?PATIENT EDUCATION:  ?Education details:  Updated HEP and gave pt a handout. Vitals, exercise form, and POC. ?Person educated: Patient and Spouse ?Education method: Explanation,  ?Education comprehension: verbalized understanding ? ? ?HOME EXERCISE PROGRAM: ?Access Code: [email or web address] ?URL: [email or web address] ?Date: 11/07/2021 ?Prepared by: Ronny Flurry ? ?Exercises ?Supine Active Straight Leg Raise - 1 x daily - 5-6 x weekly - 1-2 sets - 10 reps ?Hooklying Clamshell with Resistance - 1 x daily - 3-4 x weekly - 2 sets - 10 reps ?Sidelying Hip Abduction - 1 x daily - 5-6 x weekly - 1-2 sets - 10 reps ? ?- Seated Knee Extension with Resistance  - 1 x daily - 3-4 x weekly - 2 sets - 10 reps ?- Standing Heel Raise with Support  - 1 x daily - 5-6 x weekly - 2 sets - 10 reps ?- Standing Toe Raises at Chair  - 1 x daily - 5-6 x weekly - 2 sets - 10 reps ? ?  ?ASSESSMENT: ?  ?CLINICAL IMPRESSION: ?Pt has missed PT appointments and has been limited with exercises due to being sick with bronchitis.  Pt recently able to increase distance with ambulation and perform HEP.  Pt's tachycardia has been worse since having  bronchitis and Pt has been limited in PT.  Her tachycardia has recently improved and her HR has been more consistent.  She has started walking more at home.  Pt reports improved balance with mobility.  Pt is able to stand for longer duration including to cook though is still very limited with standing and performing  household chores.  Pt becomes fatigued with community activities and daily activities having limited tolerance.  Pt demonstrates improved strength in bilat hip flexion, L knee extension, and bilat hip abd.  Pt demonstrates improved fxnl LE strength and performance of transfers with 5x STS improving from 14 sec initially to 11 sec currently.  Pt demonstrates improved quality of gait including speed and stability.  Pt's FOTO score worsened from 78 initially to 73 currently.  Pt is progressing toward goals and should continue to benefit from cont skilled PT services to address goals, improve tolerance to exercises, and to assist in restoring desired level of function.   ? ? ?Objective impairments include Abnormal gait, cardiopulmonary status limiting activity, decreased activity tolerance, decreased balance, decreased endurance, decreased mobility, difficulty walking, and decreased strength. These impairments are limiting patient from cleaning, community activity, meal prep, shopping, and ambulation . Personal factors including 3+ comorbidities: Tachycardia, depth perception issues, lumbar arthritis, and bilat TKA  are also affecting patient's functional outcome.  ?  ?REHAB POTENTIAL: Good ?  ?CLINICAL DECISION MAKING: Evolving/moderate complexity ?  ?EVALUATION COMPLEXITY: Moderate ?  ?  ?GOALS: ?  ?SHORT TERM GOALS: ?  ?STG Name Target Date Goal status  ?1 Pt will be independent and compliant with HEP for improved strength, tolerance to activity, stability, and function.  ?Baseline:  11/22/2021 GOAL MET  ?2 Pt will be able to increase her walking program by 50% without adverse effects and demo improved   tolerance to and stability with gait in the clinic.  ?Baseline:  11/29/2021 PROGRESSING  ?3 Pt will ambulate community distance without significant fatigue.  ?Baseline: 11/29/2021 NOT MET  ?4 Pt will be able to

## 2021-12-15 ENCOUNTER — Encounter (HOSPITAL_BASED_OUTPATIENT_CLINIC_OR_DEPARTMENT_OTHER): Payer: Self-pay | Admitting: Physical Therapy

## 2021-12-20 ENCOUNTER — Ambulatory Visit (HOSPITAL_BASED_OUTPATIENT_CLINIC_OR_DEPARTMENT_OTHER): Payer: Medicare Other | Attending: Pulmonary Disease | Admitting: Physical Therapy

## 2021-12-20 DIAGNOSIS — M6281 Muscle weakness (generalized): Secondary | ICD-10-CM | POA: Insufficient documentation

## 2021-12-20 DIAGNOSIS — R262 Difficulty in walking, not elsewhere classified: Secondary | ICD-10-CM | POA: Diagnosis not present

## 2021-12-20 NOTE — Therapy (Signed)
?OUTPATIENT PHYSICAL THERAPY TREATMENT NOTE  ? ? ? ? ?Patient Name: Tracy Young ?MRN: [id number] ?DOB:04-05-1946, 76 y.o., female ?Today's Date: 12/21/2021 ? ?PCP: Maury Dus, MD ? PT End of Session - 12/20/21 1442   ? ? Visit Number 10   ? Number of Visits 21   ? Date for PT Re-Evaluation 01/25/22   ? Authorization Type MCR A&B   ? PT Start Time 4259   ? PT Stop Time 5638   ? PT Time Calculation (min) 43 min   ? Activity Tolerance Patient tolerated treatment well   ? Behavior During Therapy [facility] for tasks assessed/performed   ? ?  ?  ? ?  ? ? ? ? ? ? ? ? ? ?Past Medical History:  ?Diagnosis Date  ? Chronic kidney disease, stage 3a ([place]) 06/27/2021  ? COVID-19   ? GERD without esophagitis 06/27/2021  ? Hypertension   ? Mild intermittent asthma without complication 75/64/3329  ? Mixed hyperlipidemia 06/27/2021  ? Pneumonia   ? ?Past Surgical History:  ?Procedure Laterality Date  ? TOTAL KNEE ARTHROPLASTY    ? ?Patient Active Problem List  ? Diagnosis Date Noted  ? COVID-19 virus infection 06/27/2021  ? Acute respiratory failure with hypoxia ([place]) 06/27/2021  ? Chronic kidney disease, stage 3a ([place]) 06/27/2021  ? GERD without esophagitis 06/27/2021  ? Mixed hyperlipidemia 06/27/2021  ? Mild intermittent asthma with (acute) exacerbation 06/27/2021  ? Sepsis due to COVID-19 [facility]) 06/27/2021  ? ? ?REFERRING PROVIDER: Marshell Garfinkel, MD ?  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  ?THERAPY DIAG:  ?Muscle weakness (generalized) ?  ?Difficulty in walking, not elsewhere classified ?  ?ONSET DATE: October 2022 ?  ?SUBJECTIVE:  ?  ?SUBJECTIVE STATEMENT: ?Pt states she felt good after prior Rx.  Pt states she had increased fatigue with watching her dogs and her grandaughter.  Pt states she didn't walk as much this weekend.  Pt reports compliance with HEP.  ?Pt and husband report improved fatigue overall.  She did have increased fatigue over weekend.  Pt is unable to attend church and Sunday school due to  fatigue.  She has to choose one.   ? ?FUNCTIONAL IMPROVEMENTS:  balance including with ambulating to B/R in the middle of the night, ambulation distance.  Getting OOB.  Pt able stand longer duration to cook.  Showering. ?FUNCTIONAL LIMITATIONS:  tolerance to activity.  fatigued with showering.  Fatigued after church.  Ambulation.  Household chores ? ?   ?     ?PERTINENT HISTORY: ?Tachycardia, interstitial lung disease, depth perception issues, lumbar arthritis, bilat TKA in 2012, Coronary artery calcification,  HTN, Chronic kidney disease, stage 3a which she states she no longer has. ?  ?  ?  ?PRECAUTIONS: Pt has some unsteadiness with gait.  Pt's husband states she has depth perception issues.  Tachycardia ?  ?WEIGHT BEARING RESTRICTIONS No ?  ?FALLS:  ?Has patient fallen in last 6 months? Yes, Number of falls: 3-4.  Pt's husband states she has depth perception issues. ?  ? ?PLOF: Independent; Pt was able to perform her normal household chores and functional mobility skills without limitation.  Pt did not use an AD with extended ambulation.  ?  ?PATIENT GOALS improve LE and UE strength, improve ambulation distance.   ?  ?  ?OBJECTIVE:  ?  ?TODAY'S TREATMENT: ? ?VITALS: ?Max HR (220 - 73) = 147 ? ?At beginning of Rx at rest: 123-125 bpm , O2:  97%.  Pt states her HR was 113 sitting in waiting room ?HR:  125 after Nu-step ?HR:  135,  O2:  90-92% after sidestepping and squats eated exercise  ?HR:  128, O2:  92% after assessing gait  ? ?At end of Rx: HR:  134, O2: 91% which improved to HR: 117-119  and O2: 97%   after siting and resting ? ? ? Therapeutic Exercise: ?-Reviewed current function, HEP compliance, and pain level.  ?-Assessed HR/O2 during Rx ?-Pt performed: ?            Nustep with UE/LE x 7 mins at L3 with approx 5 bouts of 20 second intervals ?            Marching on airex 2x10 reps ?Heel raises 2x10 reps ?Sidestepping with UE assistance on rail x 3 laps ?LAQ with GTB 2x10 ?Seated Clams with GTB 2x10  reps ?Tandem gait with rail ? ? ?Therapeutic Activity: ?Squats 2x10 reps ?Step ups 2x5 each leg  ?Lateral step ups x5 reps bilat ? ?             ? ? ?PATIENT EDUCATION:  ?Education details:  Updated HEP and gave pt a handout. Vitals, exercise form, and POC. ?Person educated: Patient and Spouse ?Education method: Explanation,  ?Education comprehension: verbalized understanding ? ? ?HOME EXERCISE PROGRAM: ?Access Code: [email or web address] ?URL: [email or web address] ?Date: 11/07/2021 ?Prepared by: Ronny Flurry ? ?Exercises ?Supine Active Straight Leg Raise - 1 x daily - 5-6 x weekly - 1-2 sets - 10 reps ?Hooklying Clamshell with Resistance - 1 x daily - 3-4 x weekly - 2 sets - 10 reps ?Sidelying Hip Abduction - 1 x daily - 5-6 x weekly - 1-2 sets - 10 reps ? ?- Seated Knee Extension with Resistance  - 1 x daily - 3-4 x weekly - 2 sets - 10 reps ?- Standing Heel Raise with Support  - 1 x daily - 5-6 x weekly - 2 sets - 10 reps ?- Standing Toe Raises at Chair  - 1 x daily - 5-6 x weekly - 2 sets - 10 reps ? ?  ?ASSESSMENT: ?  ?CLINICAL IMPRESSION: ?Pt is progressing with tolerance to activity, functional endurance, function, and strength.  Though she is improving with functional endurance she continues to have significant fatigue that limits her activities.  PT increased intensity with exercises increasing closed chain activities today.  Pt did have increased fatigue with increased closed chain activities.  Pt had increased HR and decreased O2 sats with standing exercises.  PT monitored vitals closely t/o R and had pt sit down and rest t/o Rx.  Her HR improved and O2 sats quickly improved when sitting down and resting.  Pt gives good effort with all exercises.  Pt should continue to benefit from cont skilled PT services to address goals, improve tolerance to exercises, and to assist in restoring desired level of function.     ? ? ?Objective impairments include Abnormal gait, cardiopulmonary status limiting activity,  decreased activity tolerance, decreased balance, decreased endurance, decreased mobility, difficulty walking, and decreased strength. These impairments are limiting patient from cleaning, community activity, meal prep, shopping, and ambulation . Personal factors including 3+ comorbidities: Tachycardia, depth perception issues, lumbar arthritis, and bilat TKA  are also affecting patient's functional outcome.  ?  ?REHAB POTENTIAL: Good ?  ?CLINICAL DECISION MAKING: Evolving/moderate complexity ?  ?EVALUATION COMPLEXITY: Moderate ?  ?  ?GOALS: ?  ?SHORT TERM GOALS: ?  ?STG Name Target Date Goal status  ?1  Pt will be independent and compliant with HEP for improved strength, tolerance to activity, stability, and function.  ?Baseline:  11/22/2021 GOAL MET  ?2 Pt will be able to increase her walking program by 50% without adverse effects and demo improved  tolerance to and stability with gait in the clinic.  ?Baseline:  11/29/2021 PROGRESSING  ?3 Pt will ambulate community distance without significant fatigue.  ?Baseline: 11/29/2021 NOT MET  ?4 Pt will be able to stand to cook without significant difficulty or fatigue. ?Baseline: 11/29/2021 PROGRESSING  ?         ?         ?         ?  ?LONG TERM GOALS:  ?  ?LTG Name Target Date Goal status  ?1 Pt will be able to ambulate in grocery store to shop without significant fatigue and difficulty.  ?Baseline: 12/13/2021 NOT MET  ?2 Pt will demo 5/5 bilat hip flex and abd strength and L knee extension strength for improved performance of and tolerance with functional mobility.  ?Baseline: 12/13/2021 PROGRESSING  ?3 Pt will report she is able to stand to perform her ADLs and household chores without significant fatigue or difficulty.   ?Baseline: 12/13/2021 ONGOING  ?4 Pt will be able to ambulate extended community distance without significant difficulty.  ?Baseline: 12/13/2021 ONGOING  ?5 Pt will be able to perform a reciprocal gait on the stairs with 1 rail.  ?Baseline: 12/13/2021 NOT  ASSESSED  ?         ?         ?  ?PLAN: ?PT FREQUENCY: 2x/week ?  ?PT DURATION: 6 weeks ?  ?PLANNED INTERVENTIONS: Therapeutic exercises, Therapeutic activity, Neuro Muscular re-education, Balance training, Gait

## 2021-12-21 ENCOUNTER — Encounter (HOSPITAL_BASED_OUTPATIENT_CLINIC_OR_DEPARTMENT_OTHER): Payer: Self-pay | Admitting: Physical Therapy

## 2021-12-23 ENCOUNTER — Ambulatory Visit (HOSPITAL_BASED_OUTPATIENT_CLINIC_OR_DEPARTMENT_OTHER): Payer: Medicare Other | Admitting: Physical Therapy

## 2021-12-23 ENCOUNTER — Encounter (HOSPITAL_BASED_OUTPATIENT_CLINIC_OR_DEPARTMENT_OTHER): Payer: Self-pay | Admitting: Physical Therapy

## 2021-12-23 DIAGNOSIS — R262 Difficulty in walking, not elsewhere classified: Secondary | ICD-10-CM

## 2021-12-23 DIAGNOSIS — M6281 Muscle weakness (generalized): Secondary | ICD-10-CM

## 2021-12-23 NOTE — Therapy (Signed)
?OUTPATIENT PHYSICAL THERAPY TREATMENT NOTE  ? ? ? ? ?Patient Name: Tracy Young ?MRN: [id number] ?DOB:21-Oct-1945, 76 y.o., female ?Today's Date: 12/23/2021 ? ?PCP: Maury Dus, MD ? PT End of Session - 12/23/21 1156   ? ? Visit Number 11   ? Number of Visits 21   ? Date for PT Re-Evaluation 01/25/22   ? Authorization Type MCR A&B   ? PT Start Time 1152   ? PT Stop Time 1233   ? PT Time Calculation (min) 41 min   ? Activity Tolerance Patient tolerated treatment well   ? Behavior During Therapy [facility] for tasks assessed/performed   ? ?  ?  ? ?  ? ? ? ? ? ? ? ? ? ?Past Medical History:  ?Diagnosis Date  ? Chronic kidney disease, stage 3a ([place]) 06/27/2021  ? COVID-19   ? GERD without esophagitis 06/27/2021  ? Hypertension   ? Mild intermittent asthma without complication 32/20/2542  ? Mixed hyperlipidemia 06/27/2021  ? Pneumonia   ? ?Past Surgical History:  ?Procedure Laterality Date  ? TOTAL KNEE ARTHROPLASTY    ? ?Patient Active Problem List  ? Diagnosis Date Noted  ? COVID-19 virus infection 06/27/2021  ? Acute respiratory failure with hypoxia ([place]) 06/27/2021  ? Chronic kidney disease, stage 3a ([place]) 06/27/2021  ? GERD without esophagitis 06/27/2021  ? Mixed hyperlipidemia 06/27/2021  ? Mild intermittent asthma with (acute) exacerbation 06/27/2021  ? Sepsis due to COVID-19 [facility]) 06/27/2021  ? ? ?REFERRING PROVIDER: Marshell Garfinkel, MD ?  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  ?THERAPY DIAG:  ?Muscle weakness (generalized) ?  ?Difficulty in walking, not elsewhere classified ?  ?ONSET DATE: October 2022 ?  ?SUBJECTIVE:  ?  ?SUBJECTIVE STATEMENT: ?Pt feels like she turned a corner after visiting with her family on Wednesday.   Pt didn't get tired with her family and was able to talk more.  Pt walked 1/2 mile in 20 mins instead of her normal 23 mins.  Pt reports she walks in 5 min increments.  Pt denies any adverse effects after prior Rx.  Pt reports compliance with HEP.  Pt is unable to attend both  church and Sunday school due to fatigue.  She has to choose one.   ? ?FUNCTIONAL IMPROVEMENTS:  improved steadiness with ambulation at night.  balance including with ambulating to B/R in the middle of the night, ambulation distance.  Getting OOB.  Pt able stand longer duration to cook.  Showering. ?FUNCTIONAL LIMITATIONS:  tolerance to activity.  fatigued with showering.  Fatigued after church.  Ambulation.  Household chores ? ?   ?     ?PERTINENT HISTORY: ?Tachycardia, interstitial lung disease, depth perception issues, lumbar arthritis, bilat TKA in 2012, Coronary artery calcification,  HTN, Chronic kidney disease, stage 3a which she states she no longer has. ?  ?  ?  ?PRECAUTIONS: Pt has some unsteadiness with gait.  Pt's husband states she has depth perception issues.  Tachycardia ?  ?WEIGHT BEARING RESTRICTIONS No ?  ?FALLS:  ?Has patient fallen in last 6 months? Yes, Number of falls: 3-4.  Pt's husband states she has depth perception issues. ?  ? ?PLOF: Independent; Pt was able to perform her normal household chores and functional mobility skills without limitation.  Pt did not use an AD with extended ambulation.  ?  ?PATIENT GOALS improve LE and UE strength, improve ambulation distance.   ?  ?  ?OBJECTIVE:  ?  ?TODAY'S TREATMENT: ? ?VITALS: ?  Max HR (220 - 73) = 147 ? ?At beginning of Rx at rest: 117-125 bpm , O2:  96%.  Pt states her HR was 113 sitting in waiting room ?HR:  136, O2:  85% after Nu-step.  HR quickly improved.   ?HR:  138,  O2:  90-93% during 6 MWT ?HR:  133-140, O2:  89-90% during standing exercises ? ?At end of Rx: HR:  121-130, O2: 96%   after siting and resting ? ? ? Therapeutic Exercise: ?-Reviewed current function, HEP compliance, and pain level.  ?-Assessed HR/O2 during Rx ?-Pt performed: ?            Nustep with UE/LE x 7 mins at L3 with approx 5 bouts of 20 second intervals ?            Marching on airex 2x10 reps ?Sidestepping with UE assistance on rail x 3 laps ?Tandem gait with  rail ?6 MWT:  1,096 ft.  Pt did not sit down during test. ? ? ?Therapeutic Activity: ? Squats 2x10 reps ?Step ups 2x5 each leg  ?Lateral step ups x5 reps bilat ? ?             ? ? ?PATIENT EDUCATION:  ?Education details:  Instructed pt to cont with HEP.  Vitals, exercise form, and POC. ?Person educated: Patient and Spouse ?Education method: Explanation,  ?Education comprehension: verbalized understanding ? ? ?HOME EXERCISE PROGRAM: ?Access Code: [email or web address] ?URL: [email or web address] ?Date: 11/07/2021 ?Prepared by: Ronny Flurry ? ?Exercises ?Supine Active Straight Leg Raise - 1 x daily - 5-6 x weekly - 1-2 sets - 10 reps ?Hooklying Clamshell with Resistance - 1 x daily - 3-4 x weekly - 2 sets - 10 reps ?Sidelying Hip Abduction - 1 x daily - 5-6 x weekly - 1-2 sets - 10 reps ? ?- Seated Knee Extension with Resistance  - 1 x daily - 3-4 x weekly - 2 sets - 10 reps ?- Standing Heel Raise with Support  - 1 x daily - 5-6 x weekly - 2 sets - 10 reps ?- Standing Toe Raises at Chair  - 1 x daily - 5-6 x weekly - 2 sets - 10 reps ? ?  ?ASSESSMENT: ?  ?CLINICAL IMPRESSION: ?Pt presents to Rx stating she is doing better.  Pt is progressing with tolerance to activity, functional endurance, function, and strength.  Though she is improving with functional endurance she continues to have significant fatigue that limits her activities.  Pt states it felt like it was easier today and her balance was better.  Pt is able to perform increased closed chain exercises with increased intensity.  Pt had labored breathing during Rx.  Her O2 sats did decrease and HR increased though pt was performing more intensive exercises and also did the 6 MWT today.  She was able to complete the 6 MWT without sitting down.  PT monitored vitals closely t/o Rx and had pt sit down and rest intermittently t/o Rx.  Her HR and O2 sats  improved when sitting down and resting.  Pt gives good effort with all exercises.  Pt should continue to benefit  from cont skilled PT services to address goals, improve tolerance to exercises, and to assist in restoring desired level of function.     ? ?  ? ?Objective impairments include Abnormal gait, cardiopulmonary status limiting activity, decreased activity tolerance, decreased balance, decreased endurance, decreased mobility, difficulty walking, and decreased strength. These impairments are limiting patient from cleaning, community activity, meal  prep, shopping, and ambulation . Personal factors including 3+ comorbidities: Tachycardia, depth perception issues, lumbar arthritis, and bilat TKA  are also affecting patient's functional outcome.  ?  ?REHAB POTENTIAL: Good ?  ?CLINICAL DECISION MAKING: Evolving/moderate complexity ?  ?EVALUATION COMPLEXITY: Moderate ?  ?  ?GOALS: ?  ?SHORT TERM GOALS: ?  ?STG Name Target Date Goal status  ?1 Pt will be independent and compliant with HEP for improved strength, tolerance to activity, stability, and function.  ?Baseline:  11/22/2021 GOAL MET  ?2 Pt will be able to increase her walking program by 50% without adverse effects and demo improved  tolerance to and stability with gait in the clinic.  ?Baseline:  11/29/2021 PROGRESSING  ?3 Pt will ambulate community distance without significant fatigue.  ?Baseline: 11/29/2021 NOT MET  ?4 Pt will be able to stand to cook without significant difficulty or fatigue. ?Baseline: 11/29/2021 PROGRESSING  ?         ?         ?         ?  ?LONG TERM GOALS:  ?  ?LTG Name Target Date Goal status  ?1 Pt will be able to ambulate in grocery store to shop without significant fatigue and difficulty.  ?Baseline: 12/13/2021 NOT MET  ?2 Pt will demo 5/5 bilat hip flex and abd strength and L knee extension strength for improved performance of and tolerance with functional mobility.  ?Baseline: 12/13/2021 PROGRESSING  ?3 Pt will report she is able to stand to perform her ADLs and household chores without significant fatigue or difficulty.   ?Baseline: 12/13/2021  ONGOING  ?4 Pt will be able to ambulate extended community distance without significant difficulty.  ?Baseline: 12/13/2021 ONGOING  ?5 Pt will be able to perform a reciprocal gait on the stairs with 1 rail.  ?

## 2021-12-26 ENCOUNTER — Ambulatory Visit (HOSPITAL_BASED_OUTPATIENT_CLINIC_OR_DEPARTMENT_OTHER): Payer: Medicare Other | Admitting: Physical Therapy

## 2021-12-26 ENCOUNTER — Encounter (HOSPITAL_BASED_OUTPATIENT_CLINIC_OR_DEPARTMENT_OTHER): Payer: Self-pay | Admitting: Physical Therapy

## 2021-12-26 DIAGNOSIS — M6281 Muscle weakness (generalized): Secondary | ICD-10-CM | POA: Diagnosis not present

## 2021-12-26 DIAGNOSIS — R262 Difficulty in walking, not elsewhere classified: Secondary | ICD-10-CM

## 2021-12-26 NOTE — Therapy (Signed)
?OUTPATIENT PHYSICAL THERAPY TREATMENT NOTE  ? ? ? ? ?Patient Name: Tracy Young ?MRN: [id number] ?DOB:Jan 01, 1946, 76 y.o., female ?Today's Date: 12/27/2021 ? ?PCP: Maury Dus, MD ? PT End of Session - 12/26/21 1307   ? ? Visit Number 12   ? Number of Visits 21   ? Date for PT Re-Evaluation 01/25/22   ? Authorization Type MCR A&B   ? PT Start Time 4562   ? PT Stop Time 5638   ? PT Time Calculation (min) 40 min   ? Activity Tolerance Patient tolerated treatment well   ? Behavior During Therapy [facility] for tasks assessed/performed   ? ?  ?  ? ?  ? ? ? ? ? ? ? ? ? ?Past Medical History:  ?Diagnosis Date  ? Chronic kidney disease, stage 3a ([place]) 06/27/2021  ? COVID-19   ? GERD without esophagitis 06/27/2021  ? Hypertension   ? Mild intermittent asthma without complication 93/73/4287  ? Mixed hyperlipidemia 06/27/2021  ? Pneumonia   ? ?Past Surgical History:  ?Procedure Laterality Date  ? TOTAL KNEE ARTHROPLASTY    ? ?Patient Active Problem List  ? Diagnosis Date Noted  ? COVID-19 virus infection 06/27/2021  ? Acute respiratory failure with hypoxia ([place]) 06/27/2021  ? Chronic kidney disease, stage 3a ([place]) 06/27/2021  ? GERD without esophagitis 06/27/2021  ? Mixed hyperlipidemia 06/27/2021  ? Mild intermittent asthma with (acute) exacerbation 06/27/2021  ? Sepsis due to COVID-19 [facility]) 06/27/2021  ? ? ?REFERRING PROVIDER: Marshell Garfinkel, MD ?  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  ?THERAPY DIAG:  ?Muscle weakness (generalized) ?  ?Difficulty in walking, not elsewhere classified ?  ?ONSET DATE: October 2022 ?  ?SUBJECTIVE:  ?  ?SUBJECTIVE STATEMENT: ?Pt went to Sunday School, church, and then visiting with family yesterday.  Pt states she felt fine and did well.   Pt sees cardiologist in 2 weeks and has bloodwork next week.  Pt states she felt good after prior Rx and had no adverse effects.  Pt states she is able to think more clearly as evidenced by when she was getting her medications straight.   ? ?FUNCTIONAL IMPROVEMENTS:  improved steadiness with ambulation at night.  balance including with ambulating to B/R in the middle of the night, ambulation distance.  Getting OOB.  Pt able stand longer duration to cook.  Showering. ?FUNCTIONAL LIMITATIONS:  tolerance to activity.  fatigued with showering.  Fatigued after church.  Ambulation.  Household chores ? ?   ?     ?PERTINENT HISTORY: ?Tachycardia, interstitial lung disease, depth perception issues, lumbar arthritis, bilat TKA in 2012, Coronary artery calcification,  HTN, Chronic kidney disease, stage 3a which she states she no longer has. ?  ?  ?  ?PRECAUTIONS: Pt has some unsteadiness with gait.  Pt's husband states she has depth perception issues.  Tachycardia ?  ?WEIGHT BEARING RESTRICTIONS No ?  ?FALLS:  ?Has patient fallen in last 6 months? Yes, Number of falls: 3-4.  Pt's husband states she has depth perception issues. ?  ? ?PLOF: Independent; Pt was able to perform her normal household chores and functional mobility skills without limitation.  Pt did not use an AD with extended ambulation.  ?  ?PATIENT GOALS improve LE and UE strength, improve ambulation distance.   ?  ?  ?OBJECTIVE:  ?  ?TODAY'S TREATMENT: ? ?VITALS: ?Max HR (220 - 73) = 147 ? ?At beginning of Rx at rest: 113-115 bpm , O2:  97%.  Pt states her HR was 113 sitting in waiting room ?HR:  122, O2:  96% after Nu-step.  HR quickly improved.   ?HR:  136,  O2:  90 after standing exercises ?HR:  135, O2:  92% after standing exercises at end of Rx which improved to  HR: 122-128, O2: 96%   after siting and resting ? ? ? Therapeutic Exercise: ?-Reviewed current function, HEP compliance, and pain level.  ?-Assessed HR/O2 during Rx ?-Pt performed: ?            Nustep with UE/LE x 8 mins at L3 with 6 bouts of 20 second intervals ?            Marching on airex x13 and x10 reps ?Sidestepping without UE assistance  x 1 lap and with RTB x 1 lap ?Tandem gait with rail ?Heel raises on airex 2x10  reps ?Standing hip abd 2x5 reps ? ? ?Therapeutic Activity: ? Squats 2x10 reps ?Step ups 1x10 each leg  ?Lateral step ups 1x10 reps bilat ? ?             ? ?PATIENT EDUCATION:  ?Education details:  Instructed pt to cont with HEP.  Vitals, exercise form, and POC. ?Person educated: Patient and Spouse ?Education method: Explanation,  ?Education comprehension: verbalized understanding ? ? ?HOME EXERCISE PROGRAM: ?Access Code: [email or web address] ?URL: [email or web address] ?Date: 11/07/2021 ?Prepared by: Ronny Flurry ? ?Exercises ?Supine Active Straight Leg Raise - 1 x daily - 5-6 x weekly - 1-2 sets - 10 reps ?Hooklying Clamshell with Resistance - 1 x daily - 3-4 x weekly - 2 sets - 10 reps ?Sidelying Hip Abduction - 1 x daily - 5-6 x weekly - 1-2 sets - 10 reps ? ?- Seated Knee Extension with Resistance  - 1 x daily - 3-4 x weekly - 2 sets - 10 reps ?- Standing Heel Raise with Support  - 1 x daily - 5-6 x weekly - 2 sets - 10 reps ?- Standing Toe Raises at Chair  - 1 x daily - 5-6 x weekly - 2 sets - 10 reps ? ?  ?ASSESSMENT: ?  ?CLINICAL IMPRESSION: ?Pt is improving with function and tolerance to exercises and mobility.  Though she is improving with functional endurance she continues to have significant fatigue that limits her activities.  Pt is motivated and gives good effort with all exercises.  Pt able to perform increased reps with step ups during set being able to perform 10 reps consecutively.  Pt's HR is improving overall and is more consistent.  She is performing more closed chain activities.  Pt did have increased HR and reduced O2 sats with exercises.  She had multiple seated rest breaks during Rx and HR and O2 improved with seated rest breaks.  Pt responded well to Rx having no c/o's after Rx.  Pt should continue to benefit from cont skilled PT services to address goals, improve tolerance to exercises, and to assist in restoring desired level of function.    ? ?Objective impairments include Abnormal  gait, cardiopulmonary status limiting activity, decreased activity tolerance, decreased balance, decreased endurance, decreased mobility, difficulty walking, and decreased strength. These impairments are limiting patient from cleaning, community activity, meal prep, shopping, and ambulation . Personal factors including 3+ comorbidities: Tachycardia, depth perception issues, lumbar arthritis, and bilat TKA  are also affecting patient's functional outcome.  ?  ?REHAB POTENTIAL: Good ?  ?CLINICAL DECISION MAKING: Evolving/moderate complexity ?  ?EVALUATION COMPLEXITY: Moderate ?  ?  ?GOALS: ?  ?  SHORT TERM GOALS: ?  ?STG Name Target Date Goal status  ?1 Pt will be independent and compliant with HEP for improved strength, tolerance to activity, stability, and function.  ?Baseline:  11/22/2021 GOAL MET  ?2 Pt will be able to increase her walking program by 50% without adverse effects and demo improved  tolerance to and stability with gait in the clinic.  ?Baseline:  11/29/2021 PROGRESSING  ?3 Pt will ambulate community distance without significant fatigue.  ?Baseline: 11/29/2021 NOT MET  ?4 Pt will be able to stand to cook without significant difficulty or fatigue. ?Baseline: 11/29/2021 PROGRESSING  ?         ?         ?         ?  ?LONG TERM GOALS:  ?  ?LTG Name Target Date Goal status  ?1 Pt will be able to ambulate in grocery store to shop without significant fatigue and difficulty.  ?Baseline: 12/13/2021 NOT MET  ?2 Pt will demo 5/5 bilat hip flex and abd strength and L knee extension strength for improved performance of and tolerance with functional mobility.  ?Baseline: 12/13/2021 PROGRESSING  ?3 Pt will report she is able to stand to perform her ADLs and household chores without significant fatigue or difficulty.   ?Baseline: 12/13/2021 ONGOING  ?4 Pt will be able to ambulate extended community distance without significant difficulty.  ?Baseline: 12/13/2021 ONGOING  ?5 Pt will be able to perform a reciprocal gait on the  stairs with 1 rail.  ?Baseline: 12/13/2021 NOT ASSESSED  ?         ?         ?  ?PLAN: ?PT FREQUENCY: 2x/week ?  ?PT DURATION: 6 weeks ?  ?PLANNED INTERVENTIONS: Therapeutic exercises, Therapeutic activity, Neuro Muscul

## 2021-12-27 DIAGNOSIS — E782 Mixed hyperlipidemia: Secondary | ICD-10-CM | POA: Diagnosis not present

## 2021-12-27 LAB — LIPID PANEL
Chol/HDL Ratio: 2.9 ratio (ref 0.0–4.4)
Cholesterol, Total: 212 mg/dL — ABNORMAL HIGH (ref 100–199)
HDL: 74 mg/dL (ref >39)
LDL Chol Calc (NIH): 115 mg/dL — ABNORMAL HIGH (ref 0–99)
Triglycerides: 135 mg/dL (ref 0–149)
VLDL Cholesterol Cal: 23 mg/dL (ref 5–40)

## 2021-12-28 ENCOUNTER — Encounter (HOSPITAL_BASED_OUTPATIENT_CLINIC_OR_DEPARTMENT_OTHER): Payer: Self-pay | Admitting: Physical Therapy

## 2021-12-28 ENCOUNTER — Ambulatory Visit (HOSPITAL_BASED_OUTPATIENT_CLINIC_OR_DEPARTMENT_OTHER): Payer: Medicare Other | Admitting: Physical Therapy

## 2021-12-28 DIAGNOSIS — R262 Difficulty in walking, not elsewhere classified: Secondary | ICD-10-CM

## 2021-12-28 DIAGNOSIS — M6281 Muscle weakness (generalized): Secondary | ICD-10-CM

## 2021-12-28 NOTE — Therapy (Signed)
?OUTPATIENT PHYSICAL THERAPY TREATMENT NOTE  ? ? ? ? ?Patient Name: Tracy Young ?MRN: [id number] ?DOB:Jun 13, 1946, 76 y.o., female ?Today's Date: 12/28/2021 ? ?PCP: Maury Dus, MD ? PT End of Session - 12/28/21 1418   ? ? Visit Number 13   ? Number of Visits 21   ? Date for PT Re-Evaluation 01/25/22   ? Authorization Type MCR A&B   ? PT Start Time 1358   ? PT Stop Time 0160   ? PT Time Calculation (min) 39 min   ? Activity Tolerance Patient tolerated treatment well   ? Behavior During Therapy [facility] for tasks assessed/performed   ? ?  ?  ? ?  ? ? ? ? ? ? ? ? ? ? ?Past Medical History:  ?Diagnosis Date  ? Chronic kidney disease, stage 3a ([place]) 06/27/2021  ? COVID-19   ? GERD without esophagitis 06/27/2021  ? Hypertension   ? Mild intermittent asthma without complication 10/93/2355  ? Mixed hyperlipidemia 06/27/2021  ? Pneumonia   ? ?Past Surgical History:  ?Procedure Laterality Date  ? TOTAL KNEE ARTHROPLASTY    ? ?Patient Active Problem List  ? Diagnosis Date Noted  ? COVID-19 virus infection 06/27/2021  ? Acute respiratory failure with hypoxia ([place]) 06/27/2021  ? Chronic kidney disease, stage 3a ([place]) 06/27/2021  ? GERD without esophagitis 06/27/2021  ? Mixed hyperlipidemia 06/27/2021  ? Mild intermittent asthma with (acute) exacerbation 06/27/2021  ? Sepsis due to COVID-19 [facility]) 06/27/2021  ? ? ?REFERRING PROVIDER: Marshell Garfinkel, MD ?  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  ?THERAPY DIAG:  ?Muscle weakness (generalized) ?  ?Difficulty in walking, not elsewhere classified ?  ?ONSET DATE: October 2022 ?  ?SUBJECTIVE:  ?  ?SUBJECTIVE STATEMENT: ?Pt felt really good after prior Rx.  Pt states she had increased stress and fatigue with watching her "granddog" later that day.  Pt states she woke up not feeling as good the following AM and felt off for the rest of the day.  Pt woke up feeling better today though states she feels "jittery" today.  ? ?FUNCTIONAL IMPROVEMENTS:  improved steadiness  with ambulation at night.  balance including with ambulating to B/R in the middle of the night, ambulation distance.  Getting OOB.  Pt able stand longer duration to cook.  Showering. ?FUNCTIONAL LIMITATIONS:  tolerance to activity.  fatigued with showering.  Fatigued after church.  Ambulation.  Household chores ? ?       ?PERTINENT HISTORY: ?Tachycardia, interstitial lung disease, depth perception issues, lumbar arthritis, bilat TKA in 2012, Coronary artery calcification,  HTN, Chronic kidney disease, stage 3a which she states she no longer has. ?  ?  ?  ?PRECAUTIONS: Pt has some unsteadiness with gait.  Pt's husband states she has depth perception issues.  Tachycardia ?  ?WEIGHT BEARING RESTRICTIONS No ?  ?FALLS:  ?Has patient fallen in last 6 months? Yes, Number of falls: 3-4.  Pt's husband states she has depth perception issues. ?  ? ?PLOF: Independent; Pt was able to perform her normal household chores and functional mobility skills without limitation.  Pt did not use an AD with extended ambulation.  ?  ?PATIENT GOALS improve LE and UE strength, improve ambulation distance.   ?  ?  ?OBJECTIVE:  ?  ?TODAY'S TREATMENT: ? ?VITALS: ?Max HR (220 - 73) = 147 ? ?At beginning of Rx at rest: 118 bpm , O2:  95%.   ?HR:  132, O2:  95% after Nu-step.   ?  After squats: HR:  131, O2:  93% which improved to 115 and 96% respectively after sitting and resting.  ?HR:  133,  O2:  92 after standing exercises which improved to 115-122 and 96% respectively after sitting and resting.  ?HR:  132, O2:  90% after standing hip abd at end of Rx which improved to  HR: 119, O2: 96%  after siting and resting ? ? ? Therapeutic Exercise: ?-Reviewed current function, HEP compliance, and pain level.  ?-Assessed HR/O2 during Rx.  See above ?-Pt performed: ?            Nustep with UE/LE x 7 mins at L3-4 with 6 bouts of 20 second intervals ?            Marching on airex x20 reps ?Sidestepping without UE assistance  x 2 laps ?Tandem gait with rail x 2  laps ?LAQ 2x10 reps with 2# ?Seated clams with GTB 2x10 reps ?Standing hip abd 2x5 reps ?Seated Rows with RTB 2x10 reps ?Bilat ER with scap retraction with RTB 2x10 reps ?Squats x12 reps ? ?             ? ?PATIENT EDUCATION:  ?Education details:  Instructed pt to cont with HEP.  Vitals, exercise form, and POC. ?Person educated: Patient and Spouse ?Education method: Explanation, demonstration, verbal cues ?Education comprehension: verbalized understanding, returned demonstration, verbal cues required, needs further education ? ? ?HOME EXERCISE PROGRAM: ?Access Code: [email or web address] ?URL: [email or web address] ?Date: 11/07/2021 ?Prepared by: Ronny Flurry ? ?Exercises ?Supine Active Straight Leg Raise - 1 x daily - 5-6 x weekly - 1-2 sets - 10 reps ?Hooklying Clamshell with Resistance - 1 x daily - 3-4 x weekly - 2 sets - 10 reps ?Sidelying Hip Abduction - 1 x daily - 5-6 x weekly - 1-2 sets - 10 reps ? ?- Seated Knee Extension with Resistance  - 1 x daily - 3-4 x weekly - 2 sets - 10 reps ?- Standing Heel Raise with Support  - 1 x daily - 5-6 x weekly - 2 sets - 10 reps ?- Standing Toe Raises at Chair  - 1 x daily - 5-6 x weekly - 2 sets - 10 reps ? ?  ?ASSESSMENT: ?  ?CLINICAL IMPRESSION: ?Pt is improving overall with function and tolerance to exercises and mobility.  Pt was more fatigued today with exercises.  Her breathing seemed to become labored more quickly today and she also had decreased O2 sats with exertion.  Pt took increased seated rest breaks today and pt's vitals improved each time with rest and focused breathing.  PT decreased standing exercises today and increased seated exercises due to pt's fatigue and decreased O2 sats.   Pt is motivated and gives good effort with all exercises.  Pt had no c/o's after Rx.  Pt should continue to benefit from cont skilled PT services to address goals, improve tolerance to exercises, and to assist in restoring desired level of function.    ? ? ?Objective  impairments include Abnormal gait, cardiopulmonary status limiting activity, decreased activity tolerance, decreased balance, decreased endurance, decreased mobility, difficulty walking, and decreased strength. These impairments are limiting patient from cleaning, community activity, meal prep, shopping, and ambulation . Personal factors including 3+ comorbidities: Tachycardia, depth perception issues, lumbar arthritis, and bilat TKA  are also affecting patient's functional outcome.  ?  ?REHAB POTENTIAL: Good ?  ?CLINICAL DECISION MAKING: Evolving/moderate complexity ?  ?EVALUATION COMPLEXITY: Moderate ?  ?  ?GOALS: ?  ?SHORT TERM  GOALS: ?  ?STG Name Target Date Goal status  ?1 Pt will be independent and compliant with HEP for improved strength, tolerance to activity, stability, and function.  ?Baseline:  11/22/2021 GOAL MET  ?2 Pt will be able to increase her walking program by 50% without adverse effects and demo improved  tolerance to and stability with gait in the clinic.  ?Baseline:  11/29/2021 PROGRESSING  ?3 Pt will ambulate community distance without significant fatigue.  ?Baseline: 11/29/2021 NOT MET  ?4 Pt will be able to stand to cook without significant difficulty or fatigue. ?Baseline: 11/29/2021 PROGRESSING  ?         ?         ?         ?  ?LONG TERM GOALS:  ?  ?LTG Name Target Date Goal status  ?1 Pt will be able to ambulate in grocery store to shop without significant fatigue and difficulty.  ?Baseline: 12/13/2021 NOT MET  ?2 Pt will demo 5/5 bilat hip flex and abd strength and L knee extension strength for improved performance of and tolerance with functional mobility.  ?Baseline: 12/13/2021 PROGRESSING  ?3 Pt will report she is able to stand to perform her ADLs and household chores without significant fatigue or difficulty.   ?Baseline: 12/13/2021 ONGOING  ?4 Pt will be able to ambulate extended community distance without significant difficulty.  ?Baseline: 12/13/2021 ONGOING  ?5 Pt will be able to perform  a reciprocal gait on the stairs with 1 rail.  ?Baseline: 12/13/2021 NOT ASSESSED  ?         ?         ?  ?PLAN: ?PT FREQUENCY: 2x/week ?  ?PT DURATION: 6 weeks ?  ?PLANNED INTERVENTIONS: Therapeutic exercises

## 2022-01-01 NOTE — Progress Notes (Signed)
?Cardiology Office Note:   ?Date:  01/02/2022  ?NAME:  Tracy Young    ?MRN: [id number] ?DOB:  04-Apr-1946  ? ?PCP:  Maury Dus, MD  ?Cardiologist:  None  ?Electrophysiologist:  None  ? ?Referring MD: Maury Dus, MD  ? ?Chief Complaint  ?Patient presents with  ? Follow-up  ?   ?  ? ? ?History of Present Illness:   ?Tracy Young is a 76 y.o. female with a hx of asthma, ILD, HTN who presents for follow-up.  She presents with her husband.  Still getting short of breath with activity.  She also reports her heart rate can increase to the 120-130 bpm range when she does activity.  EKG demonstrates sinus tachycardia heart rate 119.  Heart rate does come down with cessation of activity.  She is working with pulmonary rehab.  She has been diagnosed with interstitial lung disease.  She has moderate restrictive pattern with severe diffusion defect.  She does report increased cough over the past few days.  She has noticed her heart rate is increased with this.  She will reach out to her primary care physician regarding possible antibiotics.  She denies any chest pain.  She reports she still is short of breath and symptoms have not changed.  We did discuss her cholesterol results.  We increased her Lipitor.  LDL 115 and HDL 74.  Her HDL is protective which we discussed.  We did discuss increasing her Lipitor which she is okay today.  Her blood pressure is 130/82.  Values range between 130-140s at home.  No symptoms of heart failure.  Overall seems to be stable.  Her lung disease appears to be the bigger issue. ? ?Problem List ?Asthma/ILD ?-Moderate restrictive lung disease, severe diffusion defect ?Coronary calcifications on Chest CT ?-mild LAD/RCA ?3. HLD ?-T chol 212, HDL 74, LDL 115, TG 135 ?4. HTN ? ?Past Medical History: ?Past Medical History:  ?Diagnosis Date  ? Chronic kidney disease, stage 3a ([place]) 06/27/2021  ? COVID-19   ? GERD without esophagitis 06/27/2021  ? Hypertension   ? Mild intermittent asthma without  complication 66/02/3015  ? Mixed hyperlipidemia 06/27/2021  ? Pneumonia   ? ? ?Past Surgical History: ?Past Surgical History:  ?Procedure Laterality Date  ? TOTAL KNEE ARTHROPLASTY    ? ? ?Current Medications: ?Current Meds  ?Medication Sig  ? albuterol (VENTOLIN HFA) 108 (90 Base) MCG/ACT inhaler Inhale 2 puffs into the lungs every 4 (four) hours as needed for wheezing or shortness of breath.  ? Ascorbic Acid (VITAMIN C) 1000 MG tablet Take 1,000 mg by mouth every morning.  ? aspirin EC 81 MG tablet Take 1 tablet (81 mg total) by mouth daily. Swallow whole.  ? benzonatate (TESSALON PERLES) 100 MG capsule Take 1 capsule (100 mg total) by mouth 3 (three) times daily as needed for cough.  ? buPROPion (WELLBUTRIN XL) 300 MG 24 hr tablet Take 300 mg by mouth daily.  ? Cholecalciferol (VITAMIN D-3) 125 MCG (5000 UT) TABS Take 5,000 Units by mouth every morning.  ? Cyanocobalamin (VITAMIN B-12 PO) Take 1 tablet by mouth every morning.  ? DULoxetine (CYMBALTA) 60 MG capsule Take 60 mg by mouth at bedtime.  ? famotidine (PEPCID) 40 MG tablet Take 40 mg by mouth at bedtime.  ? Fluticasone-Umeclidin-Vilant (TRELEGY ELLIPTA) 200-62.5-25 MCG/ACT AEPB Inhale 1 puff into the lungs daily.  ? Multiple Minerals-Vitamins (CALCIUM CITRATE-MAG-MINERALS) TABS Take 1 tablet by mouth every morning. Calcium Citrate plus magnesium and zinc  ?  NYAMYC powder Apply 1 application topically daily. Apply to legs after showering  ? pantoprazole (PROTONIX) 40 MG tablet Take 40 mg by mouth every morning.  ? raloxifene (EVISTA) 60 MG tablet Take 60 mg by mouth daily.  ? [DISCONTINUED] atorvastatin (LIPITOR) 10 MG tablet Take 1 tablet (10 mg total) by mouth daily.  ?  ? ?Allergies:    ?Doxycycline  ? ?Social History: ?Social History  ? ?Socioeconomic History  ? Marital status: Married  ?  Spouse name: Not on file  ? Number of children: 4  ? Years of education: Not on file  ? Highest education level: Not on file  ?Occupational History  ? Occupation:  Retired Press photographer  ?Tobacco Use  ? Smoking status: Never  ?  Passive exposure: Never  ? Smokeless tobacco: Never  ?Vaping Use  ? Vaping Use: Never used  ?Substance and Sexual Activity  ? Alcohol use: Never  ? Drug use: Never  ? Sexual activity: Not on file  ?Other Topics Concern  ? Not on file  ?Social History Narrative  ? Not on file  ? ?Social Determinants of Health  ? ?Financial Resource Strain: Not on file  ?Food Insecurity: Not on file  ?Transportation Needs: Not on file  ?Physical Activity: Not on file  ?Stress: Not on file  ?Social Connections: Not on file  ?  ? ?Family History: ?The patient's family history includes Heart attack in her father. There is no history of Heart disease. ? ?ROS:   ?All other ROS reviewed and negative. Pertinent positives noted in the HPI.    ? ?EKGs/Labs/Other Studies Reviewed:   ?The following studies were personally reviewed by me today: ? ?EKG:  EKG is ordered today.  The ekg ordered today demonstrates sinus tachycardia heart rate 119, nonspecific ST-T changes, and was personally reviewed by me.  ? ?TTE 09/13/2021 ? 1. Left ventricular ejection fraction, by estimation, is 65 to 70%. The  ?left ventricle has normal function. The left ventricle has no regional  ?wall motion abnormalities. Indeterminate diastolic filling due to E-A  ?fusion.  ? 2. Right ventricular systolic function is normal. The right ventricular  ?size is normal. There is normal pulmonary artery systolic pressure. The  ?estimated right ventricular systolic pressure is 94.5 mmHg.  ? 3. The mitral valve is grossly normal. Trivial mitral valve  ?regurgitation. No evidence of mitral stenosis.  ? 4. The aortic valve is tricuspid. Aortic valve regurgitation is not  ?visualized. No aortic stenosis is present.  ? 5. The inferior vena cava is normal in size with greater than 50%  ?respiratory variability, suggesting right atrial pressure of 3 mmHg.  ? ?Recent Labs: ?06/27/2021: B Natriuretic Peptide 26.8 ?07/02/2021:  ALT 41; Magnesium 2.2 ?07/03/2021: BUN 17; Creatinine, Ser 0.73; Hemoglobin 11.6; Platelets 307; Potassium 4.2; Sodium 134 ?08/14/2021: TSH 1.916  ? ?Recent Lipid Panel ?   ?Component Value Date/Time  ? CHOL 212 (H) 12/27/2021 0916  ? TRIG 135 12/27/2021 0916  ? HDL 74 12/27/2021 0916  ? CHOLHDL 2.9 12/27/2021 0916  ? [place] 115 (H) 12/27/2021 0388  ? ? ?Physical Exam:   ?VS:  BP 130/82   Pulse (!) 119   Ht '5\' 4"'$  (1.626 m)   Wt 167 lb 12.8 oz (76.1 kg)   BMI 28.80 kg/m?    ?Wt Readings from Last 3 Encounters:  ?01/02/22 167 lb 12.8 oz (76.1 kg)  ?09/28/21 164 lb 12.8 oz (74.8 kg)  ?08/25/21 167 lb 3.2 oz (75.8 kg)  ?  ?  General: Well nourished, well developed, in no acute distress ?Head: Atraumatic, normal size  ?Eyes: PEERLA, EOMI  ?Neck: Supple, no JVD ?Endocrine: No thryomegaly ?Cardiac: Normal S1, S2; RRR; no murmurs, rubs, or gallops ?Lungs: Diminished breath sounds bilaterally ?Abd: Soft, nontender, no hepatomegaly  ?Ext: No edema, pulses 2+ ?Musculoskeletal: No deformities, BUE and BLE strength normal and equal ?Skin: Warm and dry, no rashes   ?Neuro: Alert and oriented to person, place, time, and situation, CNII-XII grossly intact, no focal deficits  ?Psych: Normal mood and affect  ? ?ASSESSMENT:   ?Tracy Young is a 76 y.o. female who presents for the following: ?1. Coronary artery calcification seen on computed tomography   ?2. Mixed hyperlipidemia   ?3. Primary hypertension   ? ? ?PLAN:   ?1. Coronary artery calcification seen on computed tomography ?2. Mixed hyperlipidemia ?-Coronary calcification seen on chest CT.  She is short of breath but has no symptoms concerning for angina.  She has severe restrictive lung disease as well as severe diffusion defect.  This is related to interstitial lung disease.  She is working with pulmonary rehab.  Denies any symptoms concerning for angina.  Would recommend he continue aspirin 81 mg daily.  We will increase her Lipitor to 20 mg daily as well.  Her HDL  cholesterol is 74 which is protective.  I would like her LDL closer to 100.  Examination consistent with heart failure.  EKG demonstrates sinus tachycardia which is secondary to her interstitial lung disease and s

## 2022-01-02 ENCOUNTER — Encounter: Payer: Self-pay | Admitting: Cardiovascular Disease

## 2022-01-02 ENCOUNTER — Ambulatory Visit (INDEPENDENT_AMBULATORY_CARE_PROVIDER_SITE_OTHER): Payer: Medicare Other | Admitting: Cardiovascular Disease

## 2022-01-02 VITALS — BP 130/82 | HR 119 | Ht 64.0 in | Wt 167.8 lb

## 2022-01-02 DIAGNOSIS — R5383 Other fatigue: Secondary | ICD-10-CM | POA: Diagnosis not present

## 2022-01-02 DIAGNOSIS — R051 Acute cough: Secondary | ICD-10-CM | POA: Diagnosis not present

## 2022-01-02 DIAGNOSIS — J0100 Acute maxillary sinusitis, unspecified: Secondary | ICD-10-CM | POA: Diagnosis not present

## 2022-01-02 DIAGNOSIS — I2510 Atherosclerotic heart disease of native coronary artery without angina pectoris: Secondary | ICD-10-CM | POA: Diagnosis not present

## 2022-01-02 DIAGNOSIS — Z03818 Encounter for observation for suspected exposure to other biological agents ruled out: Secondary | ICD-10-CM | POA: Diagnosis not present

## 2022-01-02 DIAGNOSIS — R52 Pain, unspecified: Secondary | ICD-10-CM | POA: Diagnosis not present

## 2022-01-02 DIAGNOSIS — I10 Essential (primary) hypertension: Secondary | ICD-10-CM | POA: Diagnosis not present

## 2022-01-02 DIAGNOSIS — E782 Mixed hyperlipidemia: Secondary | ICD-10-CM

## 2022-01-02 DIAGNOSIS — J069 Acute upper respiratory infection, unspecified: Secondary | ICD-10-CM | POA: Diagnosis not present

## 2022-01-02 MED ORDER — AMLODIPINE BESYLATE 5 MG PO TABS: 5.0000 mg | ORAL_TABLET | Freq: Every day | ORAL | 1 refills | Status: DC

## 2022-01-02 MED ORDER — ATORVASTATIN CALCIUM 20 MG PO TABS: 20.0000 mg | ORAL_TABLET | Freq: Every day | ORAL | 1 refills | Status: DC

## 2022-01-02 NOTE — Patient Instructions (Signed)
Medication Instructions:  ?Increase Lipitor 20 mg daily  ? ?*If you need a refill on your cardiac medications before your next appointment, please call your pharmacy* ? ? ?Follow-Up: ?At [facility], you and your health needs are our priority.  As part of our continuing mission to provide you with exceptional heart care, we have created designated Provider Care Teams.  These Care Teams include your primary Cardiologist (physician) and Advanced Practice Providers (APPs -  Physician Assistants and Nurse Practitioners) who all work together to provide you with the care you need, when you need it. ? ?We recommend signing up for the patient portal called "MyChart".  Sign up information is provided on this After Visit Summary.  MyChart is used to connect with patients for Virtual Visits (Telemedicine).  Patients are able to view lab/test results, encounter notes, upcoming appointments, etc.  Non-urgent messages can be sent to your provider as well.   ?To learn more about what you can do with MyChart, go to [email or web address].   ? ?Your next appointment:   ?12 month(s) ? ?The format for your next appointment:   ?In Person ? ?Provider:   ?Eleonore Chiquito, MD  ? ? ?Important Information About Sugar ? ? ? ? ? ? ?

## 2022-01-03 ENCOUNTER — Ambulatory Visit (HOSPITAL_BASED_OUTPATIENT_CLINIC_OR_DEPARTMENT_OTHER): Payer: Medicare Other | Admitting: Physical Therapy

## 2022-01-03 ENCOUNTER — Encounter (HOSPITAL_BASED_OUTPATIENT_CLINIC_OR_DEPARTMENT_OTHER): Payer: Self-pay

## 2022-01-05 ENCOUNTER — Encounter (HOSPITAL_BASED_OUTPATIENT_CLINIC_OR_DEPARTMENT_OTHER): Payer: Self-pay

## 2022-01-05 ENCOUNTER — Ambulatory Visit (HOSPITAL_BASED_OUTPATIENT_CLINIC_OR_DEPARTMENT_OTHER): Payer: Medicare Other | Admitting: Physical Therapy

## 2022-01-11 ENCOUNTER — Other Ambulatory Visit: Payer: Self-pay | Admitting: Pulmonary Disease

## 2022-01-11 NOTE — Therapy (Signed)
?OUTPATIENT PHYSICAL THERAPY TREATMENT NOTE  ? ? ? ? ?Patient Name: Tracy Young ?MRN: [id number] ?DOB:1945-10-06, 76 y.o., female ?Today's Date: 01/13/2022 ? ?PCP: Maury Dus, MD ? PT End of Session - 01/12/22 2119   ? ? Visit Number 14   ? Number of Visits 21   ? Date for PT Re-Evaluation 01/25/22   ? Authorization Type MCR A&B   ? PT Start Time [phone]   ? PT Stop Time 0848   ? PT Time Calculation (min) 41 min   ? Activity Tolerance Patient tolerated treatment well   ? Behavior During [facility] for tasks assessed/performed   ? ?  ?  ? ?  ? ? ? ? ? ? ? ? ? ? ? ?Past Medical History:  ?Diagnosis Date  ? Chronic kidney disease, stage 3a ([place]) 06/27/2021  ? COVID-19   ? GERD without esophagitis 06/27/2021  ? Hypertension   ? Mild intermittent asthma without complication 05/01/4817  ? Mixed hyperlipidemia 06/27/2021  ? Pneumonia   ? ?Past Surgical History:  ?Procedure Laterality Date  ? TOTAL KNEE ARTHROPLASTY    ? ?Patient Active Problem List  ? Diagnosis Date Noted  ? COVID-19 virus infection 06/27/2021  ? Acute respiratory failure with hypoxia ([place]) 06/27/2021  ? Chronic kidney disease, stage 3a ([place]) 06/27/2021  ? GERD without esophagitis 06/27/2021  ? Mixed hyperlipidemia 06/27/2021  ? Mild intermittent asthma with (acute) exacerbation 06/27/2021  ? Sepsis due to COVID-19 [facility]) 06/27/2021  ? ? ?REFERRING PROVIDER: Marshell Garfinkel, MD ?  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  ?THERAPY DIAG:  ?Muscle weakness (generalized) ?  ?Difficulty in walking, not elsewhere classified ?  ?ONSET DATE: October 2022 ?  ?SUBJECTIVE:  ?  ?SUBJECTIVE STATEMENT: ?Pt has been absent from PT due to having bronchitis.  She was put on antibiotics and began feeling better this past weekend.  Pt's husband states that her bad days are getting further apart.  Pt reports she did have some pain in her legs the other day which she thinks may be from increasing her statin.  Pt had no pain today.  She decreased statin and  legs feel better.  Pt states she is back to walking her 1/2 mile and is walking it a little faster.  ?      ?PERTINENT HISTORY: ?Tachycardia, interstitial lung disease, depth perception issues, lumbar arthritis, bilat TKA in 2012, Coronary artery calcification,  HTN, Chronic kidney disease, stage 3a which she states she no longer has. ?  ?  ?  ?PRECAUTIONS: Pt has some unsteadiness with gait.  Pt's husband states she has depth perception issues.  Tachycardia ?  ?WEIGHT BEARING RESTRICTIONS No ?  ?FALLS:  ?Has patient fallen in last 6 months? Yes, Number of falls: 3-4.  Pt's husband states she has depth perception issues. ?  ? ?PLOF: Independent; Pt was able to perform her normal household chores and functional mobility skills without limitation.  Pt did not use an AD with extended ambulation.  ?  ?PATIENT GOALS improve LE and UE strength, improve ambulation distance.   ?  ?  ?OBJECTIVE:  ?  ?TODAY'S TREATMENT: ? ?VITALS: ?Max HR (220 - 73) = 147 ? ?At beginning of Rx at rest: 107-111 bpm , O2:  started 92% and increased to 96% ?Marland Kitchen  ?HR:  129 which improved to 117, O2:  92% with standing exercises ?  ?HR:  113-118, O2:  95% after Rx ? ? ? Therapeutic Exercise: ?-Reviewed  current function, HEP compliance, and pain level.  ?-Assessed HR/O2 during Rx.  See above ?-Pt performed: ?            Sidestepping without UE assistance  x 3 laps ?LAQ 2x10 reps with 2# ?Standing hip abd x10 reps ?Seated Rows with RTB 2x10 reps ?Bilat ER with scap retraction with RTB 2x10 reps ?Squats x10 with UE support and x 10 without UE support ?Step ups on 4 inch steps 2x5 reps ? ? ?Neuro Re-ed Activities: ?Marching on airex 2x10 with light UE assist ?SLS 2x10 sec with UE assist ?Tandem gait with rail x 2 laps ? ?             ? ?PATIENT EDUCATION:  ?Education details:  Instructed pt to cont with HEP.  Vitals, exercise form, and POC. ?Person educated: Patient and Spouse ?Education method: Explanation, demonstration, verbal cues ?Education  comprehension: verbalized understanding, returned demonstration, verbal cues required, needs further education ? ? ?HOME EXERCISE PROGRAM: ?Access Code: [email or web address] ?URL: [email or web address] ?Date: 11/07/2021 ?Prepared by: Ronny Flurry ? ?Exercises ?Supine Active Straight Leg Raise - 1 x daily - 5-6 x weekly - 1-2 sets - 10 reps ?Hooklying Clamshell with Resistance - 1 x daily - 3-4 x weekly - 2 sets - 10 reps ?Sidelying Hip Abduction - 1 x daily - 5-6 x weekly - 1-2 sets - 10 reps ? ?- Seated Knee Extension with Resistance  - 1 x daily - 3-4 x weekly - 2 sets - 10 reps ?- Standing Heel Raise with Support  - 1 x daily - 5-6 x weekly - 2 sets - 10 reps ?- Standing Toe Raises at Chair  - 1 x daily - 5-6 x weekly - 2 sets - 10 reps ? ?  ?ASSESSMENT: ?  ?CLINICAL IMPRESSION: ?Pt returns to PT today after getting over bronchitis.  Pt is doing better now.  Her husband states she is improving overall and her bad days are further apart.  PT slowly introduced exercises and Pt took multiple rest breaks t/o Rx.  Pt did very well with exercises though was fatigued.  She did have increased HR and decreased O2 sats which improved with seated rest breaks.  Pt had no c/o's after Rx.  Pt should continue to benefit from cont skilled PT services to address goals, improve tolerance to exercises, and to assist in restoring desired level of function.    ? ?Objective impairments include Abnormal gait, cardiopulmonary status limiting activity, decreased activity tolerance, decreased balance, decreased endurance, decreased mobility, difficulty walking, and decreased strength. These impairments are limiting patient from cleaning, community activity, meal prep, shopping, and ambulation . Personal factors including 3+ comorbidities: Tachycardia, depth perception issues, lumbar arthritis, and bilat TKA  are also affecting patient's functional outcome.  ?  ?REHAB POTENTIAL: Good ?  ?CLINICAL DECISION MAKING: Evolving/moderate  complexity ?  ?EVALUATION COMPLEXITY: Moderate ?  ?  ?GOALS: ?  ?SHORT TERM GOALS: ?  ?STG Name Target Date Goal status  ?1 Pt will be independent and compliant with HEP for improved strength, tolerance to activity, stability, and function.  ?Baseline:  11/22/2021 GOAL MET  ?2 Pt will be able to increase her walking program by 50% without adverse effects and demo improved  tolerance to and stability with gait in the clinic.  ?Baseline:  11/29/2021 PROGRESSING  ?3 Pt will ambulate community distance without significant fatigue.  ?Baseline: 11/29/2021 NOT MET  ?4 Pt will be able to stand to cook without significant difficulty or fatigue. ?  Baseline: 11/29/2021 PROGRESSING  ?         ?         ?         ?  ?LONG TERM GOALS:  ?  ?LTG Name Target Date Goal status  ?1 Pt will be able to ambulate in grocery store to shop without significant fatigue and difficulty.  ?Baseline: 12/13/2021 NOT MET  ?2 Pt will demo 5/5 bilat hip flex and abd strength and L knee extension strength for improved performance of and tolerance with functional mobility.  ?Baseline: 12/13/2021 PROGRESSING  ?3 Pt will report she is able to stand to perform her ADLs and household chores without significant fatigue or difficulty.   ?Baseline: 12/13/2021 ONGOING  ?4 Pt will be able to ambulate extended community distance without significant difficulty.  ?Baseline: 12/13/2021 ONGOING  ?5 Pt will be able to perform a reciprocal gait on the stairs with 1 rail.  ?Baseline: 12/13/2021 NOT ASSESSED  ?         ?         ?  ?PLAN: ?PT FREQUENCY: 2x/week ?  ?PT DURATION: 6 weeks ?  ?PLANNED INTERVENTIONS: Therapeutic exercises, Therapeutic activity, Neuro Muscular re-education, Balance training, Gait training, Patient/Family education, Joint mobilization, Stair training, DME instructions, Aquatic Therapy, Cryotherapy, Moist heat, Taping, and Manual therapy ?  ?PLAN FOR NEXT SESSION:  Assess HR before Rx.  Cont to closely monitor HR and O2 during exercises.  Slowly progress  exercises for improved strength, endurance, and standing depending on HR and response to exercises.   Nustep interval training.  Cont with increasing closed chain activities per pt tolerance. ? ? ?Selinda Michaels I

## 2022-01-12 ENCOUNTER — Ambulatory Visit (HOSPITAL_BASED_OUTPATIENT_CLINIC_OR_DEPARTMENT_OTHER): Payer: Medicare Other | Admitting: Physical Therapy

## 2022-01-12 DIAGNOSIS — M6281 Muscle weakness (generalized): Secondary | ICD-10-CM

## 2022-01-12 DIAGNOSIS — R262 Difficulty in walking, not elsewhere classified: Secondary | ICD-10-CM | POA: Diagnosis not present

## 2022-01-13 ENCOUNTER — Encounter (HOSPITAL_BASED_OUTPATIENT_CLINIC_OR_DEPARTMENT_OTHER): Payer: Self-pay | Admitting: Physical Therapy

## 2022-01-16 DIAGNOSIS — I10 Essential (primary) hypertension: Secondary | ICD-10-CM | POA: Diagnosis not present

## 2022-01-16 DIAGNOSIS — J4520 Mild intermittent asthma, uncomplicated: Secondary | ICD-10-CM | POA: Diagnosis not present

## 2022-01-16 DIAGNOSIS — F331 Major depressive disorder, recurrent, moderate: Secondary | ICD-10-CM | POA: Diagnosis not present

## 2022-01-16 DIAGNOSIS — E782 Mixed hyperlipidemia: Secondary | ICD-10-CM | POA: Diagnosis not present

## 2022-01-16 DIAGNOSIS — Z6829 Body mass index (BMI) 29.0-29.9, adult: Secondary | ICD-10-CM | POA: Diagnosis not present

## 2022-01-16 DIAGNOSIS — M8588 Other specified disorders of bone density and structure, other site: Secondary | ICD-10-CM | POA: Diagnosis not present

## 2022-01-16 DIAGNOSIS — Z0000 Encounter for general adult medical examination without abnormal findings: Secondary | ICD-10-CM | POA: Diagnosis not present

## 2022-01-16 DIAGNOSIS — B356 Tinea cruris: Secondary | ICD-10-CM | POA: Diagnosis not present

## 2022-01-16 DIAGNOSIS — K219 Gastro-esophageal reflux disease without esophagitis: Secondary | ICD-10-CM | POA: Diagnosis not present

## 2022-01-16 DIAGNOSIS — N1831 Chronic kidney disease, stage 3a: Secondary | ICD-10-CM | POA: Diagnosis not present

## 2022-01-16 DIAGNOSIS — Z1331 Encounter for screening for depression: Secondary | ICD-10-CM | POA: Diagnosis not present

## 2022-01-17 ENCOUNTER — Ambulatory Visit (HOSPITAL_BASED_OUTPATIENT_CLINIC_OR_DEPARTMENT_OTHER): Payer: Medicare Other | Attending: Pulmonary Disease | Admitting: Physical Therapy

## 2022-01-17 ENCOUNTER — Encounter (HOSPITAL_BASED_OUTPATIENT_CLINIC_OR_DEPARTMENT_OTHER): Payer: Self-pay | Admitting: Physical Therapy

## 2022-01-17 DIAGNOSIS — R262 Difficulty in walking, not elsewhere classified: Secondary | ICD-10-CM | POA: Diagnosis not present

## 2022-01-17 DIAGNOSIS — M6281 Muscle weakness (generalized): Secondary | ICD-10-CM | POA: Insufficient documentation

## 2022-01-17 NOTE — Therapy (Signed)
?OUTPATIENT PHYSICAL THERAPY TREATMENT NOTE  ? ? ? ? ?Patient Name: Tracy Young ?MRN: [id number] ?DOB:Feb 18, 1946, 76 y.o., female ?Today's Date: 01/18/2022 ? ?PCP: Maury Dus, MD ? PT End of Session - 01/17/22 1437   ? ? Visit Number 15   ? Number of Visits 21   ? Date for PT Re-Evaluation 01/25/22   ? Authorization Type MCR A&B   ? PT Start Time 4585   ? PT Stop Time 1435   ? PT Time Calculation (min) 41 min   ? Activity Tolerance Patient tolerated treatment well   She does have limited tolerance due to HR  ? Behavior During Therapy [facility] for tasks assessed/performed   ? ?  ?  ? ?  ? ? ? ? ? ? ? ? ? ? ? ? ?Past Medical History:  ?Diagnosis Date  ? Chronic kidney disease, stage 3a ([place]) 06/27/2021  ? COVID-19   ? GERD without esophagitis 06/27/2021  ? Hypertension   ? Mild intermittent asthma without complication 92/92/4462  ? Mixed hyperlipidemia 06/27/2021  ? Pneumonia   ? ?Past Surgical History:  ?Procedure Laterality Date  ? TOTAL KNEE ARTHROPLASTY    ? ?Patient Active Problem List  ? Diagnosis Date Noted  ? COVID-19 virus infection 06/27/2021  ? Acute respiratory failure with hypoxia ([place]) 06/27/2021  ? Chronic kidney disease, stage 3a ([place]) 06/27/2021  ? GERD without esophagitis 06/27/2021  ? Mixed hyperlipidemia 06/27/2021  ? Mild intermittent asthma with (acute) exacerbation 06/27/2021  ? Sepsis due to COVID-19 [facility]) 06/27/2021  ? ? ?REFERRING PROVIDER: Marshell Garfinkel, MD ?  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  ?THERAPY DIAG:  ?Muscle weakness (generalized) ?  ?Difficulty in walking, not elsewhere classified ?  ?ONSET DATE: October 2022 ?  ?SUBJECTIVE:  ?  ?SUBJECTIVE STATEMENT: ?Pt reports she has performed her HEP every other day since prior Rx.   ?Pt saw her PCP and had a physical yesterday.  Pt reports her PCP informed her that her HR, BP, and O2 are due to the damage in her lungs and she may not regain her full prior lung capacity.  After speaking with MD, she is going to keep  using the same amount of the statin.  Pt thinks her leg pain is from the statins.  Pt states she is not really hurting currently.  Pt denies any adverse effects after prior Rx.   ?      ?PERTINENT HISTORY: ?Tachycardia, interstitial lung disease, depth perception issues, lumbar arthritis, bilat TKA in 2012, Coronary artery calcification,  HTN, Chronic kidney disease, stage 3a which she states she no longer has. ?  ?  ?  ?PRECAUTIONS: Pt has some unsteadiness with gait.  Pt's husband states she has depth perception issues.  Tachycardia ?  ?WEIGHT BEARING RESTRICTIONS No ?  ?FALLS:  ?Has patient fallen in last 6 months? Yes, Number of falls: 3-4.  Pt's husband states she has depth perception issues. ?  ? ?PLOF: Independent; Pt was able to perform her normal household chores and functional mobility skills without limitation.  Pt did not use an AD with extended ambulation.  ?  ?PATIENT GOALS improve LE and UE strength, improve ambulation distance.   ?  ?  ?OBJECTIVE:  ?  ?TODAY'S TREATMENT: ? ?VITALS: ?Max HR (220 - 73) = 147 ? ?At beginning of Rx at rest: 120 bpm , O2:  96% ?After sidestepping: ?HR:  129-130 which improved to 117, O2:  96%  ?After squats  and marching on airex. ?HR:  143 which quickly improved to 136 and then to 123 after resting, O2:  92% which improved to 96% ? ? ? ? Therapeutic Exercise: ?-Reviewed current function, HEP compliance, and pain level.  ?-Assessed HR/O2 during Rx.  See above ?-Pt performed: ?            LAQ 2x10 reps with 2# ?Seated Rows with RTB 2x10 reps ?Bilat ER with scap retraction with RTB 2x10 reps ?Marching on airex 2x10 with light UE assist ? ? ?Therapeutic Activity: ?Sidestepping without UE assistance  x 3 laps ?Squats 2x10 without UE support ?Pt ambulated 300 ft ?After ambulating 300 ft ?HR:  134, O2:  90-92% ?Pt ascended and descended 1 flight of stairs with rail ? HR:  135, O2:  92% ? ? ?            ? ?PATIENT EDUCATION:  ?Education details:  Instructed pt to cont with HEP.   Vitals, exercise form, and POC. ?Person educated: Patient and Spouse ?Education method: Explanation, demonstration, verbal cues ?Education comprehension: verbalized understanding, returned demonstration, verbal cues required, needs further education ? ? ?HOME EXERCISE PROGRAM: ?Access Code: [email or web address] ?URL: [email or web address] ?Date: 11/07/2021 ?Prepared by: Trey Harrison ? ?Exercises ?Supine Active Straight Leg Raise - 1 x daily - 5-6 x weekly - 1-2 sets - 10 reps ?Hooklying Clamshell with Resistance - 1 x daily - 3-4 x weekly - 2 sets - 10 reps ?Sidelying Hip Abduction - 1 x daily - 5-6 x weekly - 1-2 sets - 10 reps ? ?- Seated Knee Extension with Resistance  - 1 x daily - 3-4 x weekly - 2 sets - 10 reps ?- Standing Heel Raise with Support  - 1 x daily - 5-6 x weekly - 2 sets - 10 reps ?- Standing Toe Raises at Chair  - 1 x daily - 5-6 x weekly - 2 sets - 10 reps ? ?  ?ASSESSMENT: ?  ?CLINICAL IMPRESSION: ?Pt continues to have increased HR with standing exercises and is limited with tolerance to exercises and standing duration due to increased HR.  Pt also had decreased O2 sats today with functional mobility including ambulation and stairs and some standing exercises.  Each time pt sat down and rested her O2 and HR improved.  They returned to her baseline at the end of Rx before leaving.  Pt gave good effort with all exercises.  Pt stated  "I feel better than when I came in" after Rx.  Pt should benefit from cont skilled PT services to address goals, improve tolerance to exercises, and to assist in restoring desired level of function.     ? ?Objective impairments include Abnormal gait, cardiopulmonary status limiting activity, decreased activity tolerance, decreased balance, decreased endurance, decreased mobility, difficulty walking, and decreased strength. These impairments are limiting patient from cleaning, community activity, meal prep, shopping, and ambulation . Personal factors including 3+  comorbidities: Tachycardia, depth perception issues, lumbar arthritis, and bilat TKA  are also affecting patient's functional outcome.  ?  ?REHAB POTENTIAL: Good ?  ?CLINICAL DECISION MAKING: Evolving/moderate complexity ?  ?EVALUATION COMPLEXITY: Moderate ?  ?  ?GOALS: ?  ?SHORT TERM GOALS: ?  ?STG Name Target Date Goal status  ?1 Pt will be independent and compliant with HEP for improved strength, tolerance to activity, stability, and function.  ?Baseline:  11/22/2021 GOAL MET  ?2 Pt will be able to increase her walking program by 50% without adverse effects and demo improved  tolerance   to and stability with gait in the clinic.  ?Baseline:  11/29/2021 PROGRESSING  ?3 Pt will ambulate community distance without significant fatigue.  ?Baseline: 11/29/2021 NOT MET  ?4 Pt will be able to stand to cook without significant difficulty or fatigue. ?Baseline: 11/29/2021 PROGRESSING  ?         ?         ?         ?  ?LONG TERM GOALS:  ?  ?LTG Name Target Date Goal status  ?1 Pt will be able to ambulate in grocery store to shop without significant fatigue and difficulty.  ?Baseline: 12/13/2021 NOT MET  ?2 Pt will demo 5/5 bilat hip flex and abd strength and L knee extension strength for improved performance of and tolerance with functional mobility.  ?Baseline: 12/13/2021 PROGRESSING  ?3 Pt will report she is able to stand to perform her ADLs and household chores without significant fatigue or difficulty.   ?Baseline: 12/13/2021 ONGOING  ?4 Pt will be able to ambulate extended community distance without significant difficulty.  ?Baseline: 12/13/2021 ONGOING  ?5 Pt will be able to perform a reciprocal gait on the stairs with 1 rail.  ?Baseline: 12/13/2021 NOT ASSESSED  ?         ?         ?  ?PLAN: ?PT FREQUENCY: 2x/week ?  ?PT DURATION: 6 weeks ?  ?PLANNED INTERVENTIONS: Therapeutic exercises, Therapeutic activity, Neuro Muscular re-education, Balance training, Gait training, Patient/Family education, Joint mobilization, Stair  training, DME instructions, Aquatic Therapy, Cryotherapy, Moist heat, Taping, and Manual therapy ?  ?PLAN FOR NEXT SESSION:  Cont to closely monitor HR and O2 during exercises.  Slowly progress exercises for i

## 2022-01-19 ENCOUNTER — Ambulatory Visit (HOSPITAL_BASED_OUTPATIENT_CLINIC_OR_DEPARTMENT_OTHER): Payer: Medicare Other | Admitting: Physical Therapy

## 2022-01-19 ENCOUNTER — Encounter (HOSPITAL_BASED_OUTPATIENT_CLINIC_OR_DEPARTMENT_OTHER): Payer: Self-pay | Admitting: Physical Therapy

## 2022-01-19 DIAGNOSIS — R262 Difficulty in walking, not elsewhere classified: Secondary | ICD-10-CM

## 2022-01-19 DIAGNOSIS — M6281 Muscle weakness (generalized): Secondary | ICD-10-CM | POA: Diagnosis not present

## 2022-01-19 NOTE — Therapy (Signed)
?OUTPATIENT PHYSICAL THERAPY TREATMENT NOTE  ? ? ? ? ?Patient Name: Tracy Young ?MRN: [id number] ?DOB:05/12/1946, 76 y.o., female ?Today's Date: 01/19/2022 ? ?PCP: Maury Dus, MD ? PT End of Session - 01/19/22 5498   ? ? Visit Number 16   ? Number of Visits 21   ? Date for PT Re-Evaluation 01/25/22   ? Authorization Type MCR A&B   ? PT Start Time [id number]   ? PT Stop Time 1021   ? PT Time Calculation (min) 39 min   ? Activity Tolerance Patient tolerated treatment well   limited tolerance due to HR  ? Behavior During Therapy [facility] for tasks assessed/performed   ? ?  ?  ? ?  ? ? ? ? ? ? ? ? ? ? ? ? ?Past Medical History:  ?Diagnosis Date  ? Chronic kidney disease, stage 3a ([place]) 06/27/2021  ? COVID-19   ? GERD without esophagitis 06/27/2021  ? Hypertension   ? Mild intermittent asthma without complication 58/30/9407  ? Mixed hyperlipidemia 06/27/2021  ? Pneumonia   ? ?Past Surgical History:  ?Procedure Laterality Date  ? TOTAL KNEE ARTHROPLASTY    ? ?Patient Active Problem List  ? Diagnosis Date Noted  ? COVID-19 virus infection 06/27/2021  ? Acute respiratory failure with hypoxia ([place]) 06/27/2021  ? Chronic kidney disease, stage 3a ([place]) 06/27/2021  ? GERD without esophagitis 06/27/2021  ? Mixed hyperlipidemia 06/27/2021  ? Mild intermittent asthma with (acute) exacerbation 06/27/2021  ? Sepsis due to COVID-19 [facility]) 06/27/2021  ? ? ?REFERRING PROVIDER: Marshell Garfinkel, MD ?  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  ?THERAPY DIAG:  ?Muscle weakness (generalized) ?  ?Difficulty in walking, not elsewhere classified ?  ?ONSET DATE: October 2022 ?  ?SUBJECTIVE:  ?  ?SUBJECTIVE STATEMENT: ?Pt denies pain.  Pt walked 1/2 mile with intermittent rest breaks and performed 20 mins total of walking.  Pt is walking in her garage now.  She is not walking outdoors due to the pollen.  Pt states she feels better than 2 days ago.  She had no adverse effects after prior Rx.    ?      ?PERTINENT HISTORY: ?Tachycardia,  interstitial lung disease, depth perception issues, lumbar arthritis, bilat TKA in 2012, Coronary artery calcification,  HTN, Chronic kidney disease, stage 3a which she states she no longer has. ?  ?  ?  ?PRECAUTIONS: Pt has some unsteadiness with gait.  Pt's husband states she has depth perception issues.  Tachycardia ?  ?WEIGHT BEARING RESTRICTIONS No ?  ?FALLS:  ?Has patient fallen in last 6 months? Yes, Number of falls: 3-4.  Pt's husband states she has depth perception issues. ?  ? ?PLOF: Independent; Pt was able to perform her normal household chores and functional mobility skills without limitation.  Pt did not use an AD with extended ambulation.  ?  ?PATIENT GOALS improve LE and UE strength, improve ambulation distance.   ?  ?  ?OBJECTIVE:  ?  ?TODAY'S TREATMENT: ? ?VITALS: ?Max HR (220 - 73) = 147 ? ?At beginning of Rx at rest: 115 bpm , O2:  97% ?After Nustep:  HR:  123, O2:  95% ?After sidestepping: ?HR:  131, O2:  91%  ? ? ? Therapeutic Exercise: ?-Reviewed current function, HEP compliance, and pain level.  ?-Assessed HR/O2 during Rx.  See above ?-Pt performed: ?            Nustep at L3 x 6 mins ?Seated Rows  with RTB 2x15 reps ?Bilat ER with scap retraction with RTB 2x10 reps ?Shuttle press 2 25s   2x10 reps ? ? ?Therapeutic Activity: ?Sidestepping without UE assistance  x 3 laps ?Squats 2x10 without UE support ?Pt ambulated 600 ft ?After ambulating 600 ft ?HR:  135, O2:  92% ?Pt ascended and descended 1 flight of stairs with rail x 2 reps with a step thru gait ? HR:  143, O2:  89%.  Improved to 123 and 95% respectively after sitting and resting ? ? ?            ? ?PATIENT EDUCATION:  ?Education details:  Instructed pt to cont with HEP.  Vitals, exercise form, and POC. ?Person educated: Patient and Spouse ?Education method: Explanation, demonstration, verbal cues ?Education comprehension: verbalized understanding, returned demonstration, verbal cues required, needs further education ? ? ?HOME EXERCISE  PROGRAM: ?Access Code: [email or web address] ?URL: [email or web address] ?Date: 11/07/2021 ?Prepared by: Ronny Flurry ? ?Exercises ?Supine Active Straight Leg Raise - 1 x daily - 5-6 x weekly - 1-2 sets - 10 reps ?Hooklying Clamshell with Resistance - 1 x daily - 3-4 x weekly - 2 sets - 10 reps ?Sidelying Hip Abduction - 1 x daily - 5-6 x weekly - 1-2 sets - 10 reps ? ?- Seated Knee Extension with Resistance  - 1 x daily - 3-4 x weekly - 2 sets - 10 reps ?- Standing Heel Raise with Support  - 1 x daily - 5-6 x weekly - 2 sets - 10 reps ?- Standing Toe Raises at Chair  - 1 x daily - 5-6 x weekly - 2 sets - 10 reps ? ?  ?ASSESSMENT: ?  ?CLINICAL IMPRESSION: ?Pt has improved tolerance to exercises and activity overall though continues to be limited with activity and exercises due to fatigue and increased HR.  PT is increasing volume of standing activities and mobility in the clinic and pt fatigues quickly.  She took multiple seated rest breaks t/o Rx due to fatigue which does limit exercise volume.  She reports having fatigue in bilat LE's during Rx especially after performing stairs and squats.  Pt has increased HR and reduced O2 sats with exercises especially stairs.  Pt's vitals improved quickly when sitting down.  She is slowly increasing her walking after recovering from being sick.  Pt should benefit from cont skilled PT services to address goals, improve tolerance to exercises, and to assist in restoring desired level of function.       ? ?Objective impairments include Abnormal gait, cardiopulmonary status limiting activity, decreased activity tolerance, decreased balance, decreased endurance, decreased mobility, difficulty walking, and decreased strength. These impairments are limiting patient from cleaning, community activity, meal prep, shopping, and ambulation . Personal factors including 3+ comorbidities: Tachycardia, depth perception issues, lumbar arthritis, and bilat TKA  are also affecting patient's  functional outcome.  ?  ?REHAB POTENTIAL: Good ?  ?CLINICAL DECISION MAKING: Evolving/moderate complexity ?  ?EVALUATION COMPLEXITY: Moderate ?  ?  ?GOALS: ?  ?SHORT TERM GOALS: ?  ?STG Name Target Date Goal status  ?1 Pt will be independent and compliant with HEP for improved strength, tolerance to activity, stability, and function.  ?Baseline:  11/22/2021 GOAL MET  ?2 Pt will be able to increase her walking program by 50% without adverse effects and demo improved  tolerance to and stability with gait in the clinic.  ?Baseline:  11/29/2021 PROGRESSING  ?3 Pt will ambulate community distance without significant fatigue.  ?Baseline: 11/29/2021 NOT MET  ?4  Pt will be able to stand to cook without significant difficulty or fatigue. ?Baseline: 11/29/2021 PROGRESSING  ?         ?         ?         ?  ?LONG TERM GOALS:  ?  ?LTG Name Target Date Goal status  ?1 Pt will be able to ambulate in grocery store to shop without significant fatigue and difficulty.  ?Baseline: 12/13/2021 NOT MET  ?2 Pt will demo 5/5 bilat hip flex and abd strength and L knee extension strength for improved performance of and tolerance with functional mobility.  ?Baseline: 12/13/2021 PROGRESSING  ?3 Pt will report she is able to stand to perform her ADLs and household chores without significant fatigue or difficulty.   ?Baseline: 12/13/2021 ONGOING  ?4 Pt will be able to ambulate extended community distance without significant difficulty.  ?Baseline: 12/13/2021 ONGOING  ?5 Pt will be able to perform a reciprocal gait on the stairs with 1 rail.  ?Baseline: 12/13/2021 NOT ASSESSED  ?         ?         ?  ?PLAN: ?PT FREQUENCY: 2x/week ?  ?PT DURATION: 6 weeks ?  ?PLANNED INTERVENTIONS: Therapeutic exercises, Therapeutic activity, Neuro Muscular re-education, Balance training, Gait training, Patient/Family education, Joint mobilization, Stair training, DME instructions, Aquatic Therapy, Cryotherapy, Moist heat, Taping, and Manual therapy ?  ?PLAN FOR NEXT  SESSION:  Cont to closely monitor HR and O2 during exercises.  Slowly progress exercises for improved strength, endurance, and standing depending on HR and response to exercises.   Nustep interval training.  Con

## 2022-01-23 ENCOUNTER — Ambulatory Visit: Payer: Medicare Other

## 2022-01-24 ENCOUNTER — Encounter (HOSPITAL_BASED_OUTPATIENT_CLINIC_OR_DEPARTMENT_OTHER): Payer: Self-pay | Admitting: Physical Therapy

## 2022-01-24 ENCOUNTER — Ambulatory Visit
Admission: RE | Admit: 2022-01-24 | Discharge: 2022-01-24 | Disposition: A | Discharge status: Home / Self Care | Payer: Medicare Other | Source: Ambulatory Visit | Attending: Family Medicine | Admitting: Family Medicine

## 2022-01-24 ENCOUNTER — Ambulatory Visit (HOSPITAL_BASED_OUTPATIENT_CLINIC_OR_DEPARTMENT_OTHER): Payer: Medicare Other | Admitting: Physical Therapy

## 2022-01-24 DIAGNOSIS — R262 Difficulty in walking, not elsewhere classified: Secondary | ICD-10-CM | POA: Diagnosis not present

## 2022-01-24 DIAGNOSIS — Z1231 Encounter for screening mammogram for malignant neoplasm of breast: Secondary | ICD-10-CM

## 2022-01-24 DIAGNOSIS — M6281 Muscle weakness (generalized): Secondary | ICD-10-CM | POA: Diagnosis not present

## 2022-01-24 NOTE — Therapy (Signed)
?OUTPATIENT PHYSICAL THERAPY TREATMENT NOTE  ? ? ? ? ?Patient Name: Tracy Young ?MRN: [id number] ?DOB:[birth year], 76 y.o., female ?Today's Date: 01/25/2022 ? ?PCP: Maury Dus, MD ? PT End of Session - 01/24/22 1154   ? ? Visit Number 17   ? Number of Visits 21   ? Date for PT Re-Evaluation 01/25/22   ? Authorization Type MCR A&B   ? PT Start Time 1115   ? PT Stop Time 1154   ? PT Time Calculation (min) 39 min   ? Activity Tolerance Patient tolerated treatment well   limited due to HR and O2  ? Behavior During Therapy [facility] for tasks assessed/performed   ? ?  ?  ? ?  ? ? ? ? ? ? ? ? ? ? ? ? ? ?Past Medical History:  ?Diagnosis Date  ? Chronic kidney disease, stage 3a ([place]) 06/27/2021  ? COVID-19   ? GERD without esophagitis 06/27/2021  ? Hypertension   ? Mild intermittent asthma without complication 29/10/1113  ? Mixed hyperlipidemia 06/27/2021  ? Pneumonia   ? ?Past Surgical History:  ?Procedure Laterality Date  ? TOTAL KNEE ARTHROPLASTY    ? ?Patient Active Problem List  ? Diagnosis Date Noted  ? COVID-19 virus infection 06/27/2021  ? Acute respiratory failure with hypoxia ([place]) 06/27/2021  ? Chronic kidney disease, stage 3a ([place]) 06/27/2021  ? GERD without esophagitis 06/27/2021  ? Mixed hyperlipidemia 06/27/2021  ? Mild intermittent asthma with (acute) exacerbation 06/27/2021  ? Sepsis due to COVID-19 [facility]) 06/27/2021  ? ? ?REFERRING PROVIDER: Marshell Garfinkel, MD ?  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  ?THERAPY DIAG:  ?Muscle weakness (generalized) ?  ?Difficulty in walking, not elsewhere classified ?  ?ONSET DATE: October 2022 ?  ?SUBJECTIVE:  ?  ?SUBJECTIVE STATEMENT: ?Pt denies pain.  Pt states she feels good.  Pt states she did a lot Sunday.  She went to Sunday School, church, and [employer] park.  She took her rollator to [facility].  Pt walked 1/2 mile with intermittent rest breaks and performed 20 mins total of walking.  She had no adverse effects after prior Rx.  Pt denies any  pain. ? ? ?PERTINENT HISTORY: ?Tachycardia, interstitial lung disease, depth perception issues, lumbar arthritis, bilat TKA in 2012, Coronary artery calcification,  HTN, Chronic kidney disease, stage 3a which she states she no longer has. ?  ?  ?  ?PRECAUTIONS: Pt has some unsteadiness with gait.  Pt's husband states she has depth perception issues.  Tachycardia ?  ?WEIGHT BEARING RESTRICTIONS No ?  ?FALLS:  ?Has patient fallen in last 6 months? Yes, Number of falls: 3-4.  Pt's husband states she has depth perception issues. ?  ? ?PLOF: Independent; Pt was able to perform her normal household chores and functional mobility skills without limitation.  Pt did not use an AD with extended ambulation.  ?  ?PATIENT GOALS improve LE and UE strength, improve ambulation distance.   ?  ?  ?OBJECTIVE:  ?  ?TODAY'S TREATMENT: ? ?VITALS: ?Max HR (220 - 73) = 147 ? ?At beginning of Rx at rest: 103-111 bpm , O2:  96-97% ?After Nustep:  HR:  115-118, O2:  95% ?After sidestepping: ?HR:  131, O2:  91%  ? ? ? Therapeutic Exercise: ?-Reviewed current function, HEP compliance, and pain level.  ?-Assessed HR/O2 during Rx.  See above ?-Pt performed: ?            Nustep at L3-4  x 6 mins ?Seated Rows with GTB 2x15 reps ?Shuttle press 2 25s x15 reps and 3 25s 2x15  ? ? ?Therapeutic Activity: ?Sidestepping without UE assistance  x 3 laps ?Squats 2x10 without UE support ?Pt ambulated on TM at 1.0 - 1.5 MPH x 4 mins ?After ambulating  ?HR:  136, O2:  89% ?Pt ascended and descended 1 flight of stairs with rail x 2 reps with a step thru gait ? HR:  133, O2:  89%.  Improved to 113-116 and 96% respectively after sitting and resting ? ? ?       At end of Rx:HR: 116, O2:  95% ? ?PATIENT EDUCATION:  ?Education details:  Vitals, exercise form, and POC. ?Person educated: Patient and Spouse ?Education method: Explanation, demonstration, verbal cues ?Education comprehension: verbalized understanding, returned demonstration, verbal cues required, needs  further education ? ? ?HOME EXERCISE PROGRAM: ?Access Code: [email or web address] ?URL: [email or web address] ?Date: 11/07/2021 ?Prepared by: Ronny Flurry ? ?Exercises ?Supine Active Straight Leg Raise - 1 x daily - 5-6 x weekly - 1-2 sets - 10 reps ?Hooklying Clamshell with Resistance - 1 x daily - 3-4 x weekly - 2 sets - 10 reps ?Sidelying Hip Abduction - 1 x daily - 5-6 x weekly - 1-2 sets - 10 reps ? ?- Seated Knee Extension with Resistance  - 1 x daily - 3-4 x weekly - 2 sets - 10 reps ?- Standing Heel Raise with Support  - 1 x daily - 5-6 x weekly - 2 sets - 10 reps ?- Standing Toe Raises at Chair  - 1 x daily - 5-6 x weekly - 2 sets - 10 reps ? ?  ?ASSESSMENT: ?  ?CLINICAL IMPRESSION: ?Pt continues to be limited with activity due to fatigue and tolerance though is slowly improving.  Pt had increased tolerance and mobility this past weekend going to Sunday School, church, and out to the park.  Pt is limited with standing exercises and more intense exercises including stairs and TM ambulation due to decreased O2 and increased HR.  Pt's O2 decreased to 89% with performing stairs and ambulating on TM though quickly improves with stopping and resting.  PT is increasing volume of standing activities and mobility in the clinic and pt fatigues quickly.  She took multiple seated rest breaks t/o Rx due to fatigue which does limit exercise volume.  Pt has been responding well to PT and had no c/o's after Rx.  Pt should benefit from cont skilled PT services to address goals, improve tolerance to exercises, and to assist in restoring desired level of function.       ? ? ?Objective impairments include Abnormal gait, cardiopulmonary status limiting activity, decreased activity tolerance, decreased balance, decreased endurance, decreased mobility, difficulty walking, and decreased strength. These impairments are limiting patient from cleaning, community activity, meal prep, shopping, and ambulation . Personal factors  including 3+ comorbidities: Tachycardia, depth perception issues, lumbar arthritis, and bilat TKA  are also affecting patient's functional outcome.  ?  ?REHAB POTENTIAL: Good ?  ?CLINICAL DECISION MAKING: Evolving/moderate complexity ?  ?EVALUATION COMPLEXITY: Moderate ?  ?  ?GOALS: ?  ?SHORT TERM GOALS: ?  ?STG Name Target Date Goal status  ?1 Pt will be independent and compliant with HEP for improved strength, tolerance to activity, stability, and function.  ?Baseline:  11/22/2021 GOAL MET  ?2 Pt will be able to increase her walking program by 50% without adverse effects and demo improved  tolerance to and stability with gait in the  clinic.  ?Baseline:  11/29/2021 PROGRESSING  ?3 Pt will ambulate community distance without significant fatigue.  ?Baseline: 11/29/2021 NOT MET  ?4 Pt will be able to stand to cook without significant difficulty or fatigue. ?Baseline: 11/29/2021 PROGRESSING  ?         ?         ?         ?  ?LONG TERM GOALS:  ?  ?LTG Name Target Date Goal status  ?1 Pt will be able to ambulate in grocery store to shop without significant fatigue and difficulty.  ?Baseline: 12/13/2021 NOT MET  ?2 Pt will demo 5/5 bilat hip flex and abd strength and L knee extension strength for improved performance of and tolerance with functional mobility.  ?Baseline: 12/13/2021 PROGRESSING  ?3 Pt will report she is able to stand to perform her ADLs and household chores without significant fatigue or difficulty.   ?Baseline: 12/13/2021 ONGOING  ?4 Pt will be able to ambulate extended community distance without significant difficulty.  ?Baseline: 12/13/2021 ONGOING  ?5 Pt will be able to perform a reciprocal gait on the stairs with 1 rail.  ?Baseline: 12/13/2021 NOT ASSESSED  ?         ?         ?  ?PLAN: ?PT FREQUENCY: 2x/week ?  ?PT DURATION: 6 weeks ?  ?PLANNED INTERVENTIONS: Therapeutic exercises, Therapeutic activity, Neuro Muscular re-education, Balance training, Gait training, Patient/Family education, Joint mobilization,  Stair training, DME instructions, Aquatic Therapy, Cryotherapy, Moist heat, Taping, and Manual therapy ?  ?PLAN FOR NEXT SESSION:  PN/Recert next visit.  Cont to closely monitor HR and O2 during exercises.  Cont wi

## 2022-01-25 NOTE — Therapy (Signed)
?OUTPATIENT PHYSICAL THERAPY TREATMENT NOTE / PROGRESS NOTE ? ?Progress Note ?Reporting Period 4/42023 to 01/26/2022 ? ?See note below for Objective Data and Assessment of Progress/Goals.  ? ? ? ?Patient Name: Tracy Young ?MRN: [id number] ?DOB:06/16/1946, 76 y.o., female ?Today's Date: 01/26/2022 ? ?PCP: Maury Dus, MD ? PT End of Session - 01/26/22 1033   ? ? Visit Number 18   ? Number of Visits 26   ? Date for PT Re-Evaluation 02/23/22   ? Authorization Type MCR A&B   ? PT Start Time 1029   ? PT Stop Time 1110   ? PT Time Calculation (min) 41 min   ? Activity Tolerance Patient tolerated treatment well   ? Behavior During Therapy [facility] for tasks assessed/performed   ? ?  ?  ? ?  ? ? ? ? ? ? ?Past Medical History:  ?Diagnosis Date  ? Chronic kidney disease, stage 3a ([place]) 06/27/2021  ? COVID-19   ? GERD without esophagitis 06/27/2021  ? Hypertension   ? Mild intermittent asthma without complication 40/98/1191  ? Mixed hyperlipidemia 06/27/2021  ? Pneumonia   ? ?Past Surgical History:  ?Procedure Laterality Date  ? TOTAL KNEE ARTHROPLASTY    ? ?Patient Active Problem List  ? Diagnosis Date Noted  ? COVID-19 virus infection 06/27/2021  ? Acute respiratory failure with hypoxia ([place]) 06/27/2021  ? Chronic kidney disease, stage 3a ([place]) 06/27/2021  ? GERD without esophagitis 06/27/2021  ? Mixed hyperlipidemia 06/27/2021  ? Mild intermittent asthma with (acute) exacerbation 06/27/2021  ? Sepsis due to COVID-19 [facility]) 06/27/2021  ? ? ?REFERRING PROVIDER: Marshell Garfinkel, MD ?  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  ?THERAPY DIAG:  ?Muscle weakness (generalized) ?  ?Difficulty in walking, not elsewhere classified ?  ?ONSET DATE: October 2022 ?  ?SUBJECTIVE:  ?  ?SUBJECTIVE STATEMENT: ?Pt's husband present. ?Pt denies pain.  Pt states she feels good.  Pt states she did a lot Sunday.  She went to Sunday School, church, and [employer] park.  She took her rollator to [facility].  Pt walked 1/2 mile with  intermittent rest breaks and performed 20 mins total of walking.  She had no adverse effects after prior Rx.  Pt denies any pain. ?Pt reports improved resting HR.  ?FUNCTIONAL IMPROVEMENTS:  Increased standing duration with cooking, increased motivation to perform activities, ambulation distance, increased tolerance with community activities including going to church and able to go to park.  Pt reports 75% improvement in walking program and 25% in the past month.  Pt is more active during the day.  Stairs.  Pt's husband reports it used to take her hours to recover from showering though now only 15 mins to recover. ?FUNCTIONAL LIMITATIONS: Ambulation distance, hasn't tried walking in grocery store, limited tolerance to activity, still gets fatigued with showering, household chores ? ? ?PERTINENT HISTORY: ?Tachycardia, interstitial lung disease, depth perception issues, lumbar arthritis, bilat TKA in 2012, Coronary artery calcification,  HTN, Chronic kidney disease, stage 3a which she states she no longer has. ?  ?  ?  ?PRECAUTIONS: Pt has some unsteadiness with gait.  Pt's husband states she has depth perception issues.  Tachycardia ?  ?WEIGHT BEARING RESTRICTIONS No ?  ?FALLS:  ?Has patient fallen in last 6 months? Yes, Number of falls: 3-4.  Pt's husband states she has depth perception issues. ?  ? ?PLOF: Independent; Pt was able to perform her normal household chores and functional mobility skills without limitation.  Pt did not use an AD with extended ambulation.  ?  ?PATIENT GOALS improve LE and UE strength, improve ambulation distance.   ?  ?  ?OBJECTIVE:  ? ?TODAY'S TREATMENT: ?  ?VITALS: ?Max HR (220 - 73) = 147 ?  ?At beginning of Rx at rest: 111 - 116 bpm , O2:  98%.   ?During testing:  HR:  120-121 bpm, O2:  94% which improved to HR:  110-114, O2:  96% prior to walk test.  ? ? ?PHYSICAL PERFORMANCE TESTING: ?  ?-Reviewed current function, reported functional progress/deficits, HEP compliance, and pain  level.  ?  ?PATIENT SURVEYS:  ?  ?FOTO 59 which has improved from 46 prior.   Pt's goal of 65 at visit#10 ?  ?  ?LE/UE MMT: ?  ?MMT Right ?11/01/2021 Left ?11/01/2021  ?Hip flexion 5/5; ?30.4 4+/5;  ?24.3  ?Hip extension      ?Hip abduction 4/5 ?18.2 4+/5 ?21.2  ?Hip adduction      ?Hip internal rotation      ?Hip external rotation 5/5 5/5  ?Knee flexion 5/5 tested in sitting 5/5 tested in sitting  ?Knee extension 5/5;  ?27.9 5/5; ?23.7  ?Ankle dorsiflexion 5/5 5/5  ?Ankle plantarflexion WFL tested in sitting WFL tested in sitting  ?Shoulder Flex 5/5 5/5  ?Shoulder Abd 4+/5 5/5  ? (Blank rows = not tested) ?  ? ?FUNCTIONAL TESTS:  ?5 times sit to stand: 11 seconds without UEs  ? ?6 MWT:  1,096 ft.  PT had pt sit down due to increased respiratory rate and checking vitals. ?O2:  90-93%, HR:  133 at 4 min 20 sec.  After test:  HR: 133, O2:  87%.  O2 improved quickly to 97% and HR improved also to 113-120 ?  ?GAIT: ?Comments: Pt ambulates with a heel to toe gait with reciprocal arm swing.  Pt has improved gait speed, foot clearance, and stability.   ? ?STAIRS: ?  Pt ascended and descended 1 flight of stairs with rail with a step thru gait ? ?VITALS: ?Max HR (220 - 73) = 147 ? ?At beginning of Rx at rest: 103-111 bpm , O2:  96-97% ?After Nustep:  HR:  115-118, O2:  95% ?After sidestepping: ?HR:  131, O2:  91%  ? ? ? ?PATIENT EDUCATION:  ?Education details:  Vitals, exercise form, and POC. ?Person educated: Patient and Spouse ?Education method: Explanation, demonstration, verbal cues ?Education comprehension: verbalized understanding, returned demonstration, verbal cues required, needs further education ? ? ?HOME EXERCISE PROGRAM: ?Access Code: [email or web address] ?URL: [email or web address] ?Date: 11/07/2021 ?Prepared by: Ronny Flurry ? ?Exercises ?Supine Active Straight Leg Raise - 1 x daily - 5-6 x weekly - 1-2 sets - 10 reps ?Hooklying Clamshell with Resistance - 1 x daily - 3-4 x weekly - 2 sets - 10  reps ?Sidelying Hip Abduction - 1 x daily - 5-6 x weekly - 1-2 sets - 10 reps ? ?- Seated Knee Extension with Resistance  - 1 x daily - 3-4 x weekly - 2 sets - 10 reps ?- Standing Heel Raise with Support  - 1 x daily - 5-6 x weekly - 2 sets - 10 reps ?- Standing Toe Raises at Chair  - 1 x daily - 5-6 x weekly - 2 sets - 10 reps ? ?  ?ASSESSMENT: ?  ?CLINICAL IMPRESSION: ?Pt is improving with functional mobility and functional tolerance as evidenced by subjective reports of increased ambulation and standing duration, improved tolerance with showering and cooking,  and increased tolerance with community activities.  Pt is more active during the day.  Though she is improving, she continues to be limited with functional mobility and becomes fatigued with ADLs/IADLs and community activities.  Pt is progressing with exercises in PT though is limited due to increased HR and decreased O2 sats.  When pt sits down to rest, her HR and O2 quickly improve.   ?Pt continues to have weakness in L hip flexion and bilat hip abduction.  She has weakness in bilat quads L > R as compared to age related norms with HHD.  Pt has improved stairs.  Pt has met STG's #1,2,4 and is progressing toward other goals though has not attempted ambulating in grocery store.  Pt did miss some PT during prior cert period due to having bronchitis.  Pt should benefit from cont skilled PT services to address ongoing goals, improve strength, and to assist in restoring desired level of function.  ? ? ?Objective impairments include Abnormal gait, cardiopulmonary status limiting activity, decreased activity tolerance, decreased balance, decreased endurance, decreased mobility, difficulty walking, and decreased strength. These impairments are limiting patient from cleaning, community activity, meal prep, shopping, and ambulation . Personal factors including 3+ comorbidities: Tachycardia, depth perception issues, lumbar arthritis, and bilat TKA  are also affecting  patient's functional outcome.  ?  ?REHAB POTENTIAL: Good ?  ?CLINICAL DECISION MAKING: Evolving/moderate complexity ?  ?EVALUATION COMPLEXITY: Moderate ?  ?  ?GOALS: ?  ?SHORT TERM GOALS: ?  ?STG Name Target Date Goal statu

## 2022-01-26 ENCOUNTER — Ambulatory Visit (HOSPITAL_BASED_OUTPATIENT_CLINIC_OR_DEPARTMENT_OTHER): Payer: Medicare Other | Admitting: Physical Therapy

## 2022-01-26 ENCOUNTER — Encounter (HOSPITAL_BASED_OUTPATIENT_CLINIC_OR_DEPARTMENT_OTHER): Payer: Self-pay | Admitting: Physical Therapy

## 2022-01-26 DIAGNOSIS — R262 Difficulty in walking, not elsewhere classified: Secondary | ICD-10-CM

## 2022-01-26 DIAGNOSIS — M6281 Muscle weakness (generalized): Secondary | ICD-10-CM | POA: Diagnosis not present

## 2022-01-26 DIAGNOSIS — U071 COVID-19: Secondary | ICD-10-CM | POA: Diagnosis not present

## 2022-01-27 ENCOUNTER — Encounter: Payer: Self-pay | Admitting: Cardiovascular Disease

## 2022-01-27 DIAGNOSIS — D72829 Elevated white blood cell count, unspecified: Secondary | ICD-10-CM | POA: Diagnosis not present

## 2022-01-27 MED ORDER — ATORVASTATIN CALCIUM 20 MG PO TABS: 20.0000 mg | ORAL_TABLET | Freq: Every day | ORAL | 1 refills | Status: DC

## 2022-02-02 ENCOUNTER — Encounter (HOSPITAL_BASED_OUTPATIENT_CLINIC_OR_DEPARTMENT_OTHER): Payer: Self-pay | Admitting: Physical Therapy

## 2022-02-02 ENCOUNTER — Ambulatory Visit (HOSPITAL_BASED_OUTPATIENT_CLINIC_OR_DEPARTMENT_OTHER): Payer: Medicare Other | Admitting: Physical Therapy

## 2022-02-02 DIAGNOSIS — Z79899 Other long term (current) drug therapy: Secondary | ICD-10-CM | POA: Diagnosis not present

## 2022-02-02 DIAGNOSIS — M5412 Radiculopathy, cervical region: Secondary | ICD-10-CM | POA: Diagnosis not present

## 2022-02-02 DIAGNOSIS — R269 Unspecified abnormalities of gait and mobility: Secondary | ICD-10-CM | POA: Diagnosis not present

## 2022-02-02 DIAGNOSIS — M545 Low back pain, unspecified: Secondary | ICD-10-CM | POA: Diagnosis not present

## 2022-02-02 DIAGNOSIS — R262 Difficulty in walking, not elsewhere classified: Secondary | ICD-10-CM

## 2022-02-02 DIAGNOSIS — M6281 Muscle weakness (generalized): Secondary | ICD-10-CM | POA: Diagnosis not present

## 2022-02-02 DIAGNOSIS — G603 Idiopathic progressive neuropathy: Secondary | ICD-10-CM | POA: Diagnosis not present

## 2022-02-02 NOTE — Therapy (Addendum)
OUTPATIENT PHYSICAL THERAPY TREATMENT NOTE   Patient Name: KRYSTINA STRIETER MRN: [id number] DOB:Jul 05, 1946, 76 y.o., female Today's Date: 02/02/2022  PCP: Maury Dus, MD  PT End of Session - 02/02/22 1153     Visit Number 19    Number of Visits 26    Date for PT Re-Evaluation 02/23/22    Authorization Type MCR A&B    PT Start Time 1110    PT Stop Time 1151    PT Time Calculation (min) 41 min    Activity Tolerance Patient tolerated treatment well   limited due to fatigue and HR   Behavior During Therapy [facility] for tasks assessed/performed                  Past Medical History:  Diagnosis Date   Chronic kidney disease, stage 3a ([place]) 06/27/2021   COVID-19    GERD without esophagitis 06/27/2021   Hypertension    Mild intermittent asthma without complication 98/92/1194   Mixed hyperlipidemia 06/27/2021   Pneumonia    Past Surgical History:  Procedure Laterality Date   TOTAL KNEE ARTHROPLASTY     Patient Active Problem List   Diagnosis Date Noted   COVID-19 virus infection 06/27/2021   Acute respiratory failure with hypoxia ([place]) 06/27/2021   Chronic kidney disease, stage 3a ([place]) 06/27/2021   GERD without esophagitis 06/27/2021   Mixed hyperlipidemia 06/27/2021   Mild intermittent asthma with (acute) exacerbation 06/27/2021   Sepsis due to COVID-19 [facility]) 06/27/2021    REFERRING PROVIDER: Marshell Garfinkel, MD   REFERRING DIAG: 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 Pt denies pain and states she is not having the pain in her legs anymore that she thought was due to the statins.  Pt denies any adverse effects after prior Rx.  She went to Sunday School and church and felt ok.  Pt continues to perform her walking program and is able to ambulate 10 mins now before resting.   Her HR is around 121.  Pt reports improved resting HR and her HR has been under 100 with resting.  Pt is planning to go to the grocery store today.    FUNCTIONAL IMPROVEMENTS:  Increased standing duration with cooking, increased motivation to perform activities, ambulation distance, increased tolerance with community activities including going to church and able to go to park.  Pt is more active during the day.  Stairs.  Pt's husband reports it used to take her hours to recover from showering though now only 15 mins to recover. FUNCTIONAL LIMITATIONS: Ambulation distance, hasn't tried walking in grocery store, limited tolerance to activity, still gets fatigued with showering, household chores   PERTINENT HISTORY: Tachycardia, interstitial lung disease, depth perception issues, lumbar arthritis, bilat TKA in 2012, Coronary artery calcification,  HTN, Chronic kidney disease, stage 3a which she states she no longer has.       PRECAUTIONS: Pt has some unsteadiness with gait.  Pt's husband states she has depth perception issues.  Tachycardia   WEIGHT BEARING RESTRICTIONS No   FALLS:  Has patient fallen in last 6 months? Yes, Number of falls: 3-4.  Pt's husband states she has depth perception issues.    PLOF: Independent; Pt was able to perform her normal household chores and functional mobility skills without limitation.  Pt did not use an AD  with extended ambulation.    PATIENT GOALS improve LE and UE strength, improve ambulation distance.       OBJECTIVE:   TODAY'S TREATMENT:     Therapeutic Exercise: -Reviewed current function, response to prior Rx, HEP compliance, and pain level.  -Pt performed:  Nu-step at L4 x 6 mins with intervals  Lateral band walks with GTB around thighs x 3 laps  Shuttle leg press 3x15 with 3 25s Squats 2x10 reps      VITALS: Max HR (220 - 73) = 147  -At beginning of Rx at rest: 111 - 116 bpm , O2:  95%. -After Nustep:  HR:  126, O2:  96% -After  sidestepping and squats:  HR:  134, O2:  89-90%   Therapeutic Activities: -Pt ambulated on TM at 1.0 - 1.5 MPH x 3 mins and then 1.5 MPH x 3 mins After ambulating  HR:  136, O2:  90% 2nd rep: HR: 136, O2:  94% -Step ups x 5 reps each leg with 1 rail on 4 inch step and on 6 inch step    PATIENT EDUCATION:  Education details:  Vitals, exercise form, and POC. Person educated: Patient and Spouse Education method: Explanation, demonstration, verbal cues Education comprehension: verbalized understanding, returned demonstration, verbal cues required, needs further education   HOME EXERCISE PROGRAM: Access Code: [facility] URL: [email or web address] Date: 11/07/2021 Prepared by: Ronny Flurry  Exercises Supine Active Straight Leg Raise - 1 x daily - 5-6 x weekly - 1-2 sets - 10 reps Hooklying Clamshell with Resistance - 1 x daily - 3-4 x weekly - 2 sets - 10 reps Sidelying Hip Abduction - 1 x daily - 5-6 x weekly - 1-2 sets - 10 reps  - Seated Knee Extension with Resistance  - 1 x daily - 3-4 x weekly - 2 sets - 10 reps - Standing Heel Raise with Support  - 1 x daily - 5-6 x weekly - 2 sets - 10 reps - Standing Toe Raises at Chair  - 1 x daily - 5-6 x weekly - 2 sets - 10 reps    ASSESSMENT:   CLINICAL IMPRESSION: Pt presents to Rx reporting improved HR and is able to ambulate for increased time.  Pt also has less fatigue with church activities on Sunday AM.  PT is increasing intensity of exercises though pt does have to take rest breaks due to increased HR, fatigue, and weakness.  Pt's legs were quivering during the 2nd set of squats and she decreased her ROM with squats due to fatigue and weakness.  Pt requires multiple seated rest breaks during Rx due increased HR and fatigue.  Her HR and O2 sats do improve when she sits down.  Pt ambulated with increased speed on TM and had a better response with O2 sats today.  Pt should benefit from cont skilled PT services to address  ongoing goals, improve strength, and to assist in restoring desired level of function.   Objective impairments include Abnormal gait, cardiopulmonary status limiting activity, decreased activity tolerance, decreased balance, decreased endurance, decreased mobility, difficulty walking, and decreased strength. These impairments are limiting patient from cleaning, community activity, meal prep, shopping, and ambulation . Personal factors including 3+ comorbidities: Tachycardia, depth perception issues, lumbar arthritis, and bilat TKA  are also affecting patient's functional outcome.    REHAB POTENTIAL: Good   CLINICAL DECISION MAKING: Evolving/moderate complexity   EVALUATION COMPLEXITY: Moderate     GOALS:   SHORT TERM GOALS:   STG Name  Target Date Goal status  1 Pt will be independent and compliant with HEP for improved strength, tolerance to activity, stability, and function.  Baseline:  11/22/2021 GOAL MET  2 Pt will be able to increase her walking program by 50% without adverse effects and demo improved  tolerance to and stability with gait in the clinic.  Baseline:  11/29/2021 GOAL MET  3 Pt will ambulate community distance without significant fatigue.  Baseline: 11/29/2021 PROGRESSING  4 Pt will be able to stand to cook without significant difficulty or fatigue. Baseline: 11/29/2021 GOAL MET                               LONG TERM GOALS:    LTG Name Target Date Goal status  1 Pt will be able to ambulate in grocery store to shop without significant fatigue and difficulty.  Baseline: 02/23/2022 NOT MET  2 Pt will demo 5/5 bilat hip flex and abd strength and L knee extension strength for improved performance of and tolerance with functional mobility.  Baseline: 02/23/2022 PROGRESSING  3 Pt will report she is able to stand to perform her ADLs and household chores without significant fatigue or difficulty.   Baseline: 02/23/2022 ONGOING  4 Pt will be able to ambulate extended community  distance without significant difficulty.  Baseline: 02/23/2022 ONGOING  5 Pt will be able to perform a reciprocal gait on the stairs with 1 rail.  Baseline: 02/23/2022 PROGRESSING                      PLAN: PT FREQUENCY: 2x/week   PT DURATION: 4 weeks   PLANNED INTERVENTIONS: Therapeutic exercises, Therapeutic activity, Neuro Muscular re-education, Balance training, Gait training, Patient/Family education, Joint mobilization, Stair training, DME instructions, Aquatic Therapy, Cryotherapy, Moist heat, Taping, and Manual therapy   PLAN FOR NEXT SESSION:  Cont to closely monitor HR and O2 during exercises.  Cont with increasing closed chain activities per pt tolerance.  Cont with TM ambulation.  Update HEP.    Selinda Michaels III PT, DPT 02/02/22 9:40 PM

## 2022-02-03 ENCOUNTER — Other Ambulatory Visit: Payer: Self-pay

## 2022-02-04 DIAGNOSIS — Z23 Encounter for immunization: Secondary | ICD-10-CM | POA: Diagnosis not present

## 2022-02-08 ENCOUNTER — Encounter (HOSPITAL_BASED_OUTPATIENT_CLINIC_OR_DEPARTMENT_OTHER): Payer: Medicare Other | Admitting: Physical Therapy

## 2022-02-15 ENCOUNTER — Encounter (HOSPITAL_BASED_OUTPATIENT_CLINIC_OR_DEPARTMENT_OTHER): Payer: Self-pay | Admitting: Physical Therapy

## 2022-02-15 ENCOUNTER — Ambulatory Visit (HOSPITAL_BASED_OUTPATIENT_CLINIC_OR_DEPARTMENT_OTHER): Payer: Medicare Other | Admitting: Physical Therapy

## 2022-02-15 DIAGNOSIS — R262 Difficulty in walking, not elsewhere classified: Secondary | ICD-10-CM | POA: Diagnosis not present

## 2022-02-15 DIAGNOSIS — M6281 Muscle weakness (generalized): Secondary | ICD-10-CM

## 2022-02-15 NOTE — Therapy (Signed)
OUTPATIENT PHYSICAL THERAPY TREATMENT NOTE   Patient Name: AMBERLEA SPAGNUOLO MRN: [id number] DOB:April 05, 1946, 76 y.o., female Today's Date: 02/15/2022  PCP: Maury Dus, MD  PT End of Session - 02/15/22 1031     Visit Number 20    Number of Visits 26    Date for PT Re-Evaluation 02/23/22    Authorization Type MCR A&B    PT Start Time 0940    PT Stop Time 1022    PT Time Calculation (min) 42 min    Activity Tolerance Patient tolerated treatment well   limited due to fatigue   Behavior During Therapy [facility] for tasks assessed/performed                   Past Medical History:  Diagnosis Date   Chronic kidney disease, stage 3a ([place]) 06/27/2021   COVID-19    GERD without esophagitis 06/27/2021   Hypertension    Mild intermittent asthma without complication 34/19/6222   Mixed hyperlipidemia 06/27/2021   Pneumonia    Past Surgical History:  Procedure Laterality Date   TOTAL KNEE ARTHROPLASTY     Patient Active Problem List   Diagnosis Date Noted   COVID-19 virus infection 06/27/2021   Acute respiratory failure with hypoxia ([place]) 06/27/2021   Chronic kidney disease, stage 3a ([place]) 06/27/2021   GERD without esophagitis 06/27/2021   Mixed hyperlipidemia 06/27/2021   Mild intermittent asthma with (acute) exacerbation 06/27/2021   Sepsis due to COVID-19 [facility]) 06/27/2021    REFERRING PROVIDER: Marshell Garfinkel, MD   REFERRING DIAG: 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 Pt reports she hasn't been to PT in about 2 weeks due to the clinic's schedule/availability.  Pt states she has had a good 2 weeks.  Pt tries to pace herself and states she is getting back to some things she hasn't done in awhile.  Pt reports she had significant increased fatigue on Monday before last due to a very  busy Sunday.  Pt is able to ambulate in grocery store approx 40 mins without significant difficulty or fatigue.  Pt repors she is able to perform her ADLs and household chores without significant fatigue or difficulty.     PERTINENT HISTORY: Tachycardia, interstitial lung disease, depth perception issues, lumbar arthritis, bilat TKA in 2012, Coronary artery calcification,  HTN, Chronic kidney disease, stage 3a which she states she no longer has.       PRECAUTIONS: Pt has some unsteadiness with gait.  Pt's husband states she has depth perception issues.  Tachycardia   WEIGHT BEARING RESTRICTIONS No   FALLS:  Has patient fallen in last 6 months? Yes, Number of falls: 3-4.  Pt's husband states she has depth perception issues.    PLOF: Independent; Pt was able to perform her normal household chores and functional mobility skills without limitation.  Pt did not use an AD with extended ambulation.    PATIENT GOALS improve LE and UE strength, improve ambulation distance.       OBJECTIVE:   TODAY'S TREATMENT:   VITALS: Max HR (220 - 73) = 147  -At beginning of Rx at rest: 99 - 110 bpm , O2:  98%. -At end of Rx:  HR:  variable b/w 116 - 126, O2:  96%    Therapeutic Exercise: -Reviewed current function, response to prior  Rx, HEP compliance, and pain level.  -Pt performed:  Lateral band walks with GTB around thighs x 3 laps  Shuttle leg press 3x15 with 3 25s Squats x15 reps        Therapeutic Activities: -Pt ambulated on TM at 1.5 - 1.8 MPH x 8 mins At 3 mins:  HR:  123, O2:  94% At 6 mins: HR: 123-130, O2:  93% Resting after ambulation: HR:  119-120, O2:  98% -Step ups x 5 reps each leg with 1 rail on 4 inch step and on 6 inch step   HR:  135-140, O2:  93% which improved to 126-127 and 96% respectively -lateral step ups 2x5 reps -Reviewed goals and educated pt on goal progress.      PATIENT EDUCATION:  Education details:  Vitals, exercise form, and POC. Person  educated: Patient and Spouse Education method: Explanation, demonstration, verbal cues Education comprehension: verbalized understanding, returned demonstration, verbal cues required, needs further education   HOME EXERCISE PROGRAM: Access Code: [facility] URL: [email or web address] Date: 11/07/2021 Prepared by: Ronny Flurry  Exercises Supine Active Straight Leg Raise - 1 x daily - 5-6 x weekly - 1-2 sets - 10 reps Hooklying Clamshell with Resistance - 1 x daily - 3-4 x weekly - 2 sets - 10 reps Sidelying Hip Abduction - 1 x daily - 5-6 x weekly - 1-2 sets - 10 reps  - Seated Knee Extension with Resistance  - 1 x daily - 3-4 x weekly - 2 sets - 10 reps - Standing Heel Raise with Support  - 1 x daily - 5-6 x weekly - 2 sets - 10 reps - Standing Toe Raises at Chair  - 1 x daily - 5-6 x weekly - 2 sets - 10 reps    ASSESSMENT:   CLINICAL IMPRESSION: Pt presents to Rx stating she has been feeling much better.  She has made much improvement in function as evidenced by subjective reports.  Pt reports she is able to stand to perform her normal ADLs and household chores without significant difficulty or fatigue.  Pt is also ambulating community distance without significant fatigue.  Pt has met STG #3 LTG's #1,3.  PT increased speed with TM ambulation today and Pt did well.  Pt had improved vitals on TM and used bilat UE support with TM.  Pt is limited with amount of exercises due to fatigue.  She requires multiple seated rest breaks during Rx due to increased HR and fatigue.  PT assessed vitals t/o Rx.  Her HR and O2 sats improve when she sits down.  Pt gives excellent effort with all exercises.            Objective impairments include Abnormal gait, cardiopulmonary status limiting activity, decreased activity tolerance, decreased balance, decreased endurance, decreased mobility, difficulty walking, and decreased strength. These impairments are limiting patient from cleaning,  community activity, meal prep, shopping, and ambulation . Personal factors including 3+ comorbidities: Tachycardia, depth perception issues, lumbar arthritis, and bilat TKA  are also affecting patient's functional outcome.    REHAB POTENTIAL: Good   CLINICAL DECISION MAKING: Evolving/moderate complexity   EVALUATION COMPLEXITY: Moderate     GOALS:   SHORT TERM GOALS:   STG Name Target Date Goal status  1 Pt will be independent and compliant with HEP for improved strength, tolerance to activity, stability, and function.  Baseline:  11/22/2021 GOAL MET  2 Pt will be able to increase her walking program by 50% without adverse effects and demo  improved  tolerance to and stability with gait in the clinic.  Baseline:  11/29/2021 GOAL MET  3 Pt will ambulate community distance without significant fatigue.  Baseline: 11/29/2021 GOAL MET  4 Pt will be able to stand to cook without significant difficulty or fatigue. Baseline: 11/29/2021 GOAL MET                               LONG TERM GOALS:    LTG Name Target Date Goal status  1 Pt will be able to ambulate in grocery store to shop without significant fatigue and difficulty.  Baseline: 02/23/2022 GOAL MET  2 Pt will demo 5/5 bilat hip flex and abd strength and L knee extension strength for improved performance of and tolerance with functional mobility.  Baseline: 02/23/2022 PROGRESSING  3 Pt will report she is able to stand to perform her ADLs and household chores without significant fatigue or difficulty.   Baseline: 02/23/2022 GOAL MET  4 Pt will be able to ambulate extended community distance without significant difficulty.  Baseline: 02/23/2022 ONGOING  5 Pt will be able to perform a reciprocal gait on the stairs with 1 rail.  Baseline: 02/23/2022 PROGRESSING                      PLAN: PT FREQUENCY: 2x/week   PT DURATION: 4 weeks   PLANNED INTERVENTIONS: Therapeutic exercises, Therapeutic activity, Neuro Muscular re-education, Balance  training, Gait training, Patient/Family education, Joint mobilization, Stair training, DME instructions, Aquatic Therapy, Cryotherapy, Moist heat, Taping, and Manual therapy   PLAN FOR NEXT SESSION:  Cont to closely monitor HR and O2 during exercises.  Assess strength and 6 MWT.  Possible discharge in the next 2 visits.  Review HEP.   Selinda Michaels III PT, DPT 02/15/22 10:27 PM

## 2022-02-17 ENCOUNTER — Encounter (HOSPITAL_BASED_OUTPATIENT_CLINIC_OR_DEPARTMENT_OTHER): Payer: Self-pay | Admitting: Physical Therapy

## 2022-02-17 ENCOUNTER — Ambulatory Visit (HOSPITAL_BASED_OUTPATIENT_CLINIC_OR_DEPARTMENT_OTHER): Payer: Medicare Other | Attending: Pulmonary Disease | Admitting: Physical Therapy

## 2022-02-17 DIAGNOSIS — R262 Difficulty in walking, not elsewhere classified: Secondary | ICD-10-CM | POA: Diagnosis not present

## 2022-02-17 DIAGNOSIS — M6281 Muscle weakness (generalized): Secondary | ICD-10-CM | POA: Diagnosis not present

## 2022-02-17 NOTE — Therapy (Signed)
OUTPATIENT PHYSICAL THERAPY TREATMENT NOTE   Patient Name: Tracy Young MRN: [id number] DOB:1946/02/02, 76 y.o., female Today's Date: 02/17/2022  PCP: Maury Dus, MD  PT End of Session - 02/17/22 1156     Visit Number 21    Number of Visits 26    Date for PT Re-Evaluation 02/23/22    Authorization Type MCR A&B    PT Start Time 108    PT Stop Time 1233    PT Time Calculation (min) 40 min    Activity Tolerance Patient tolerated treatment well    Behavior During Therapy [facility] for tasks assessed/performed                   Past Medical History:  Diagnosis Date   Chronic kidney disease, stage 3a ([place]) 06/27/2021   COVID-19    GERD without esophagitis 06/27/2021   Hypertension    Mild intermittent asthma without complication 25/36/6440   Mixed hyperlipidemia 06/27/2021   Pneumonia    Past Surgical History:  Procedure Laterality Date   TOTAL KNEE ARTHROPLASTY     Patient Active Problem List   Diagnosis Date Noted   COVID-19 virus infection 06/27/2021   Acute respiratory failure with hypoxia ([place]) 06/27/2021   Chronic kidney disease, stage 3a ([place]) 06/27/2021   GERD without esophagitis 06/27/2021   Mixed hyperlipidemia 06/27/2021   Mild intermittent asthma with (acute) exacerbation 06/27/2021   Sepsis due to COVID-19 [facility]) 06/27/2021    REFERRING PROVIDER: Marshell Garfinkel, MD   REFERRING DIAG: 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 Pt states "I feel great".  Pt states she has had a good 2 weeks.  Pt tries to pace herself and states she is getting back to some things she hasn't done in awhile.  Pt reports she had significant increased fatigue on Monday before last due to a very busy Sunday.  Pt ambulated in grocery store 45 mins after prior Rx without significant  difficulty or fatigue.  Pt repors she is able to perform her ADLs and household chores without significant fatigue or difficulty.     PERTINENT HISTORY: Tachycardia, interstitial lung disease, depth perception issues, lumbar arthritis, bilat TKA in 2012, Coronary artery calcification,  HTN, Chronic kidney disease, stage 3a which she states she no longer has.       PRECAUTIONS: Pt has some unsteadiness with gait.  Pt's husband states she has depth perception issues.  Tachycardia   WEIGHT BEARING RESTRICTIONS No   FALLS:  Has patient fallen in last 6 months? Yes, Number of falls: 3-4.  Pt's husband states she has depth perception issues.    PLOF: Independent; Pt was able to perform her normal household chores and functional mobility skills without limitation.  Pt did not use an AD with extended ambulation.    PATIENT GOALS improve LE and UE strength, improve ambulation distance.       OBJECTIVE:   TODAY'S TREATMENT:    Therapeutic Exercise:  VITALS: Max HR (220 - 73) = 147  LE/UE MMT:   MMT Right 01/26/2022 Left 01/26/2022 Right 02/17/2022 Left 02/17/2202  Hip flexion 5/5; 30.4 4+/5;  24.3 5/5 22.0 5.5 21.7  Hip extension        Hip abduction 4/5 18.2 4+/5 21.2 4+/5 21.7 5/5 25  Hip adduction  Hip internal rotation        Hip external rotation 5/5 5/5    Knee flexion 5/5 tested in sitting 5/5 tested in sitting 5/5 tested in sitting 5/5 tested in sitting  Knee extension 5/5;  27.9 5/5; 23.7 5/5 30.6 5/5 24.5  Ankle dorsiflexion 5/5 5/5    Ankle plantarflexion WFL tested in sitting WFL tested in sitting    Shoulder Flex 5/5 5/5    Shoulder Abd 4+/5 5/5     Her HR was 122 after testing and decreased to 107 with resting though was changing a lot.  HR 107 - 118.  O2:  97% prior to 6 MWT.   6 MWT:  1,200 ft (Prior 1,096 ft).  O2:  92-93%, HR:  130-138, at 4 min 30 sec.  After test:  HR: 130, O2:  92% which improved quickly to 96%.      -Reviewed current  function, response to prior Rx, HEP compliance, and pain level.  -Pt performed:  Lateral band walks with GTB around thighs x 3 laps  Shuttle leg press 3x15 with 3 25s Squats x15 reps    -Reviewed goals and educated pt on goal progress.   -Reviewed HEP and progression of HEP.    PATIENT EDUCATION:  Education details:  Vitals, exercise form, and POC.  Educated pt in progression of HEP. Person educated: Patient and Spouse Education method: Explanation, demonstration, verbal cues Education comprehension: verbalized understanding, returned demonstration, verbal cues required, needs further education   HOME EXERCISE PROGRAM: Access Code: [facility] URL: [email or web address] Date: 11/07/2021 Prepared by: Ronny Flurry  Exercises Supine Active Straight Leg Raise - 1 x daily - 5-6 x weekly - 1-2 sets - 10 reps Hooklying Clamshell with Resistance - 1 x daily - 3-4 x weekly - 2 sets - 10 reps Sidelying Hip Abduction - 1 x daily - 5-6 x weekly - 1-2 sets - 10 reps  - Seated Knee Extension with Resistance  - 1 x daily - 3-4 x weekly - 2 sets - 10 reps - Standing Heel Raise with Support  - 1 x daily - 5-6 x weekly - 2 sets - 10 reps - Standing Toe Raises at Chair  - 1 x daily - 5-6 x weekly - 2 sets - 10 reps    ASSESSMENT:   CLINICAL IMPRESSION: Pt is making good progress with function, strength, and tolerance to activity.  She demonstrates improved bilat LE strength.  Pt demonstrates improved ambulation distance on 6 MWT from 1,096 ft to 1,200 ft.  Pt's HR was a little more variable today though consistently decreased with rest.  Pt has improved with ambulation distance having reduced fatigue with ambulation.  She is ambulating in grocery store without significant difficulty.  Pt responded well to Rx and should be ready for discharge next visit.        Objective impairments include Abnormal gait, cardiopulmonary status limiting activity, decreased activity tolerance,  decreased balance, decreased endurance, decreased mobility, difficulty walking, and decreased strength. These impairments are limiting patient from cleaning, community activity, meal prep, shopping, and ambulation . Personal factors including 3+ comorbidities: Tachycardia, depth perception issues, lumbar arthritis, and bilat TKA  are also affecting patient's functional outcome.    REHAB POTENTIAL: Good   CLINICAL DECISION MAKING: Evolving/moderate complexity   EVALUATION COMPLEXITY: Moderate     GOALS:   SHORT TERM GOALS:   STG Name Target Date Goal status  1 Pt will be independent and compliant with HEP for improved strength,  tolerance to activity, stability, and function.  Baseline:  11/22/2021 GOAL MET  2 Pt will be able to increase her walking program by 50% without adverse effects and demo improved  tolerance to and stability with gait in the clinic.  Baseline:  11/29/2021 GOAL MET  3 Pt will ambulate community distance without significant fatigue.  Baseline: 11/29/2021 GOAL MET  4 Pt will be able to stand to cook without significant difficulty or fatigue. Baseline: 11/29/2021 GOAL MET                               LONG TERM GOALS:    LTG Name Target Date Goal status  1 Pt will be able to ambulate in grocery store to shop without significant fatigue and difficulty.  Baseline: 02/23/2022 GOAL MET  2 Pt will demo 5/5 bilat hip flex and abd strength and L knee extension strength for improved performance of and tolerance with functional mobility.  Baseline: 02/23/2022 90% met  3 Pt will report she is able to stand to perform her ADLs and household chores without significant fatigue or difficulty.   Baseline: 02/23/2022 GOAL MET  4 Pt will be able to ambulate extended community distance without significant difficulty.  Baseline: 02/23/2022 ONGOING  5 Pt will be able to perform a reciprocal gait on the stairs with 1 rail.  Baseline: 02/23/2022 PROGRESSING                      PLAN: PT  FREQUENCY: 2x/week   PT DURATION: 4 weeks   PLANNED INTERVENTIONS: Therapeutic exercises, Therapeutic activity, Neuro Muscular re-education, Balance training, Gait training, Patient/Family education, Joint mobilization, Stair training, DME instructions, Aquatic Therapy, Cryotherapy, Moist heat, Taping, and Manual therapy   PLAN FOR NEXT SESSION:  Discharge pt next visit.  Progress HEP.   Selinda Michaels III PT, DPT 02/17/22 2:05 PM

## 2022-02-21 ENCOUNTER — Ambulatory Visit (HOSPITAL_BASED_OUTPATIENT_CLINIC_OR_DEPARTMENT_OTHER): Payer: Medicare Other | Admitting: Physical Therapy

## 2022-02-21 ENCOUNTER — Encounter (HOSPITAL_BASED_OUTPATIENT_CLINIC_OR_DEPARTMENT_OTHER): Payer: Self-pay | Admitting: Physical Therapy

## 2022-02-21 DIAGNOSIS — R262 Difficulty in walking, not elsewhere classified: Secondary | ICD-10-CM

## 2022-02-21 DIAGNOSIS — M6281 Muscle weakness (generalized): Secondary | ICD-10-CM

## 2022-02-21 NOTE — Therapy (Signed)
OUTPATIENT PHYSICAL THERAPY TREATMENT NOTE / DISCHARGE  Patient Name: Tracy Young MRN: [id number] DOB:07-Dec-1945, 76 y.o., female Today's Date: 02/22/2022  PCP: Maury Dus, MD  PT End of Session - 02/21/22 1123     Visit Number 22    Number of Visits 22    Authorization Type MCR A&B    PT Start Time 1110    PT Stop Time 1150    PT Time Calculation (min) 40 min    Activity Tolerance Patient tolerated treatment well    Behavior During Therapy [facility] for tasks assessed/performed                   Past Medical History:  Diagnosis Date   Chronic kidney disease, stage 3a ([place]) 06/27/2021   COVID-19    GERD without esophagitis 06/27/2021   Hypertension    Mild intermittent asthma without complication 49/44/9675   Mixed hyperlipidemia 06/27/2021   Pneumonia    Past Surgical History:  Procedure Laterality Date   TOTAL KNEE ARTHROPLASTY     Patient Active Problem List   Diagnosis Date Noted   COVID-19 virus infection 06/27/2021   Acute respiratory failure with hypoxia ([place]) 06/27/2021   Chronic kidney disease, stage 3a ([place]) 06/27/2021   GERD without esophagitis 06/27/2021   Mixed hyperlipidemia 06/27/2021   Mild intermittent asthma with (acute) exacerbation 06/27/2021   Sepsis due to COVID-19 [facility]) 06/27/2021    REFERRING PROVIDER: Marshell Garfinkel, MD   REFERRING DIAG: 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 Pt states she feels good and had no adverse effects after prior Rx.  Pt ambulated in grocery store 45 mins without significant difficulty or fatigue.  Pt repors she is able to perform her ADLs and household chores without significant fatigue or difficulty.  Pt is very appreciative of skilled PT services.      PERTINENT HISTORY: Tachycardia, interstitial lung  disease, depth perception issues, lumbar arthritis, bilat TKA in 2012, Coronary artery calcification,  HTN, Chronic kidney disease, stage 3a which she states she no longer has.       PRECAUTIONS: Pt has some unsteadiness with gait.  Pt's husband states she has depth perception issues.  Tachycardia   WEIGHT BEARING RESTRICTIONS No   FALLS:  Has patient fallen in last 6 months? Yes, Number of falls: 3-4.  Pt's husband states she has depth perception issues.    PLOF: Independent; Pt was able to perform her normal household chores and functional mobility skills without limitation.  Pt did not use an AD with extended ambulation.    PATIENT GOALS improve LE and UE strength, improve ambulation distance.       OBJECTIVE:   TODAY'S TREATMENT:    Therapeutic Exercise:  VITALS: Max HR (220 - 73) = 147  PRIOR TREATMENT:  LE/UE MMT:   MMT Right 01/26/2022 Left 01/26/2022 Right 02/17/2022 Left 02/17/2202  Hip flexion 5/5; 30.4 4+/5;  24.3 5/5 22.0 5.5 21.7  Hip extension        Hip abduction 4/5 18.2 4+/5 21.2 4+/5 21.7 5/5 25  Hip adduction        Hip internal rotation        Hip external rotation 5/5 5/5    Knee flexion 5/5 tested in sitting 5/5 tested in sitting 5/5 tested in sitting 5/5 tested in  sitting  Knee extension 5/5;  27.9 5/5; 23.7 5/5 30.6 5/5 24.5  Ankle dorsiflexion 5/5 5/5    Ankle plantarflexion WFL tested in sitting WFL tested in sitting    Shoulder Flex 5/5 5/5    Shoulder Abd 4+/5 5/5     Her HR was 122 after testing and decreased to 107 with resting though was changing a lot.  HR 107 - 118.  O2:  97% prior to 6 MWT.   6 MWT:  1,200 ft (Prior 1,096 ft).  O2:  92-93%, HR:  130-138, at 4 min 30 sec.  After test:  HR: 130, O2:  92% which improved quickly to 96%.   TODAY'S TREATMENT:  -Reviewed current function, response to prior Rx, HEP compliance, and pain level.  -Pt performed:  Nustep at L4 x 5 mins with intervals   HR:  128, O2:  90% which  improved 106-115 after and 96% respectively with resting Lateral band walks with GTB around thighs x 2 laps  Squats 2x10 reps Tandem Gait with 1 rail x 2 reps with SBA Seated rows with RTB 2x10 reps Seated bilat ER with RTB 2x10 reps  -PT went through HEP with pt and advanced HEP.  Educated pt and husband with correct exercises, correct form, and appropriate frequency.  Pt received a HEP handout.     -Reviewed goals and educated pt on goal progress.     -FOTO:  73 which has improved from prior 59.  Met goal of 65    PATIENT EDUCATION:  Education details:  Vitals, exercise form, and POC.  Educated pt in progression of HEP. Person educated: Patient and Spouse Education method: Explanation, demonstration, verbal cues Education comprehension: verbalized understanding, returned demonstration, verbal cues required, needs further education   HOME EXERCISE PROGRAM: Access Code: [facility] URL: [email or web address] Prepared by: Ronny Flurry  Updated HEP:   - Mini Squat with Counter Support  - 3-4 x weekly - 2 sets - 10 reps - Side Stepping with Resistance at Thighs  - 3 x weekly - 2 sets - 10 reps - Seated Shoulder Row with Anchored Resistance  - 3-4 x weekly - 2 sets - 10 reps - Shoulder External Rotation and Scapular Retraction with Resistance  - 3-4 x weekly - 2 sets - 10 reps.  Perform seated - Standing March with Counter Support  - 1 x daily - 5 x weekly - 2 sets - 10 reps   ASSESSMENT:   CLINICAL IMPRESSION: Pt has made great progress with function, strength, and tolerance to activity.  Pt has made good progress toward goals, see below for specific goal progress.   As noted from last Rx, pt has increased her 6 MWT ambulation distance and improved bilat LE strength.  Pt's HR continues to vary though has improved.  She has improved tolerance with exercises though still becomes very fatigued with exercises.  Pt is able to stand and prepare food.  Pt is able to perform her  normal household chores without significant fatigue and difficulty.  She is ambulating in grocery store without significant difficulty.  She demonstrates improved self perceived disability with FOTO score improving from prior 59 to currently 73.  PT educated pt and husband with advanced HEP and they demonstrate good understanding.  Pt received an advanced HEP.  Pt is ready for discharge.        Objective impairments include Abnormal gait, cardiopulmonary status limiting activity, decreased activity tolerance, decreased balance, decreased endurance, decreased mobility, difficulty walking, and  decreased strength. These impairments are limiting patient from cleaning, community activity, meal prep, shopping, and ambulation . Personal factors including 3+ comorbidities: Tachycardia, depth perception issues, lumbar arthritis, and bilat TKA  are also affecting patient's functional outcome.    REHAB POTENTIAL: Good   CLINICAL DECISION MAKING: Evolving/moderate complexity   EVALUATION COMPLEXITY: Moderate     GOALS:   SHORT TERM GOALS:   STG Name Target Date Goal status  1 Pt will be independent and compliant with HEP for improved strength, tolerance to activity, stability, and function.  Baseline:  11/22/2021 GOAL MET  2 Pt will be able to increase her walking program by 50% without adverse effects and demo improved  tolerance to and stability with gait in the clinic.  Baseline:  11/29/2021 GOAL MET  3 Pt will ambulate community distance without significant fatigue.  Baseline: 11/29/2021 GOAL MET  4 Pt will be able to stand to cook without significant difficulty or fatigue. Baseline: 11/29/2021 GOAL MET                               LONG TERM GOALS:    LTG Name Target Date Goal status  1 Pt will be able to ambulate in grocery store to shop without significant fatigue and difficulty.  Baseline: 02/23/2022 GOAL MET  2 Pt will demo 5/5 bilat hip flex and abd strength and L knee extension strength for  improved performance of and tolerance with functional mobility.  Baseline: 02/23/2022 90% met  3 Pt will report she is able to stand to perform her ADLs and household chores without significant fatigue or difficulty.   Baseline: 02/23/2022 GOAL MET  4 Pt will be able to ambulate extended community distance without significant difficulty.  Baseline: 02/23/2022 PARTIALLY MET  5 Pt will be able to perform a reciprocal gait on the stairs with 1 rail.  Baseline: 02/23/2022 NOT ASSESSED                      PLAN:  PLANNED INTERVENTIONS: Therapeutic exercises, Therapeutic activity, Neuro Muscular re-education, Balance training, Gait training, Patient/Family education, Joint mobilization, Stair training, DME instructions, Aquatic Therapy, Cryotherapy, Moist heat, Taping, and Manual therapy   PLAN FOR NEXT SESSION:  Pt to be discharged from skilled PT services due to good functional progress and good progress toward goals.  Pt is agreeable with discharge.  She will cont with HEP and walking program.  PHYSICAL THERAPY DISCHARGE SUMMARY  Visits from Start of Care: 22  Current functional level related to goals / functional outcomes: See above   Remaining deficits: See above   Education / Equipment: Pt has a HEP   Selinda Michaels III PT, DPT 02/22/22 3:12 PM

## 2022-02-22 DIAGNOSIS — D72829 Elevated white blood cell count, unspecified: Secondary | ICD-10-CM | POA: Diagnosis not present

## 2022-02-23 ENCOUNTER — Ambulatory Visit (HOSPITAL_BASED_OUTPATIENT_CLINIC_OR_DEPARTMENT_OTHER): Payer: Medicare Other | Admitting: Physical Therapy

## 2022-02-23 ENCOUNTER — Encounter (HOSPITAL_BASED_OUTPATIENT_CLINIC_OR_DEPARTMENT_OTHER): Payer: Self-pay

## 2022-03-06 ENCOUNTER — Telehealth: Payer: Self-pay | Admitting: Hematology and Oncology

## 2022-03-06 NOTE — Progress Notes (Signed)
Cardiology Office Note:   Date:  03/09/2022  NAME:  Tracy Young    MRN: [id number] DOB:  Mar 20, 1946   PCP:  Maury Dus, MD  Cardiologist:  None  Electrophysiologist:  None   Referring MD: Maury Dus, MD   Chief Complaint  Patient presents with   Follow-up   History of Present Illness:   Tracy Young is a 76 y.o. female with a hx of interstitial lung disease, coronary calcifications, hypertension who presents for follow-up.  She presents for follow-up.  She completed physical therapy for her interstitial lung disease.  Oxygen levels staying above 88%.  Echo was normal.  Blood pressure 150/80.  She reports values been high.  We discussed starting losartan 25 mg daily in addition to her amlodipine.  She is okay to do this.  She reports cramping on Lipitor.  Most recent LDL cholesterol 109.  We have not tried Crestor.  She is okay to do this.  She denies any chest pain.  No swelling in her legs.  There is no evidence that she is developing pulmonary hypertension.  She is a bit bothered by her heart rate.  She has an Apple watch.  All readings show sinus tachycardia.  Suspect this is secondary to her lung disease.  Does not necessarily need to be treated.  I recommended to remain active and to treat the lungs appropriately.  Also describes some intermittent dizziness with activity.  I suspect this is likely related to lung disease as well.  No signs of atrial fibrillation.  I did counsel her to watch out for this.  With her Apple Watch she should catch any episodes.  Problem List Asthma/ILD -Moderate restrictive lung disease, severe diffusion defect Coronary calcifications on Chest CT -mild LAD/RCA 3. HLD -T chol 215, HDL 69, LDL 109, triglycerides 217 4. HTN  Past Medical History: Past Medical History:  Diagnosis Date   Chronic kidney disease, stage 3a ([place]) 06/27/2021   COVID-19    GERD without esophagitis 06/27/2021   Hypertension    Mild intermittent asthma without  complication 78/67/6720   Mixed hyperlipidemia 06/27/2021   Pneumonia     Past Surgical History: Past Surgical History:  Procedure Laterality Date   TOTAL KNEE ARTHROPLASTY      Current Medications: Current Meds  Medication Sig   albuterol (VENTOLIN HFA) 108 (90 Base) MCG/ACT inhaler Inhale 2 puffs into the lungs every 4 (four) hours as needed for wheezing or shortness of breath.   amLODipine (NORVASC) 5 MG tablet Take 1 tablet (5 mg total) by mouth daily.   Ascorbic Acid (VITAMIN C) 1000 MG tablet Take 1,000 mg by mouth every morning.   aspirin EC 81 MG tablet Take 1 tablet (81 mg total) by mouth daily. Swallow whole.   benzonatate (TESSALON PERLES) 100 MG capsule Take 1 capsule (100 mg total) by mouth 3 (three) times daily as needed for cough.   buPROPion (WELLBUTRIN XL) 300 MG 24 hr tablet Take 300 mg by mouth daily.   Cholecalciferol (VITAMIN D-3) 125 MCG (5000 UT) TABS Take 5,000 Units by mouth every morning.   Cyanocobalamin (VITAMIN B-12 PO) Take 1 tablet by mouth every morning.   DULoxetine (CYMBALTA) 60 MG capsule Take 60 mg by mouth at bedtime.   famotidine (PEPCID) 40 MG tablet Take 40 mg by mouth at bedtime.   Fluticasone-Umeclidin-Vilant (TRELEGY ELLIPTA) 200-62.5-25 MCG/ACT AEPB INHALE ONE PUFF BY MOUTH DAILY   losartan (COZAAR) 25 MG tablet Take 1 tablet (25 mg total)  by mouth daily.   Multiple Minerals-Vitamins (CALCIUM CITRATE-MAG-MINERALS) TABS Take 1 tablet by mouth every morning. Calcium Citrate plus magnesium and zinc   NYAMYC powder Apply 1 application topically daily. Apply to legs after showering   pantoprazole (PROTONIX) 40 MG tablet Take 40 mg by mouth every morning.   raloxifene (EVISTA) 60 MG tablet Take 60 mg by mouth daily.   rosuvastatin (CRESTOR) 20 MG tablet Take 1 tablet (20 mg total) by mouth daily.   [DISCONTINUED] atorvastatin (LIPITOR) 20 MG tablet Take 1 tablet (20 mg total) by mouth daily.     Allergies:    Iodine stainless [iodine] and  Doxycycline   Social History: Social History   Socioeconomic History   Marital status: Married    Spouse name: Not on file   Number of children: 4   Years of education: Not on file   Highest education level: Not on file  Occupational History   Occupation: Retired Press photographer  Tobacco Use   Smoking status: Never    Passive exposure: Never   Smokeless tobacco: Never  Vaping Use   Vaping Use: Never used  Substance and Sexual Activity   Alcohol use: Never   Drug use: Never   Sexual activity: Not on file  Other Topics Concern   Not on file  Social History Narrative   Not on file   Social Determinants of Health   Financial Resource Strain: Not on file  Food Insecurity: Not on file  Transportation Needs: Not on file  Physical Activity: Not on file  Stress: Not on file  Social Connections: Not on file     Family History: The patient's family history includes Heart attack in her father. There is no history of Heart disease or Breast cancer.  ROS:   All other ROS reviewed and negative. Pertinent positives noted in the HPI.     EKGs/Labs/Other Studies Reviewed:   The following studies were personally reviewed by me today:  TTE 09/13/2021  1. Left ventricular ejection fraction, by estimation, is 65 to 70%. The  left ventricle has normal function. The left ventricle has no regional  wall motion abnormalities. Indeterminate diastolic filling due to E-A  fusion.   2. Right ventricular systolic function is normal. The right ventricular  size is normal. There is normal pulmonary artery systolic pressure. The  estimated right ventricular systolic pressure is 25.3 mmHg.   3. The mitral valve is grossly normal. Trivial mitral valve  regurgitation. No evidence of mitral stenosis.   4. The aortic valve is tricuspid. Aortic valve regurgitation is not  visualized. No aortic stenosis is present.   5. The inferior vena cava is normal in size with greater than 50%  respiratory  variability, suggesting right atrial pressure of 3 mmHg.   Recent Labs: 06/27/2021: B Natriuretic Peptide 26.8 07/02/2021: ALT 41; Magnesium 2.2 07/03/2021: BUN 17; Creatinine, Ser 0.73; Hemoglobin 11.6; Platelets 307; Potassium 4.2; Sodium 134 08/14/2021: TSH 1.916   Recent Lipid Panel    Component Value Date/Time   CHOL 212 (H) 12/27/2021 0916   TRIG 135 12/27/2021 0916   HDL 74 12/27/2021 0916   CHOLHDL 2.9 12/27/2021 0916   LDLCALC 115 (H) 12/27/2021 0916    Physical Exam:   VS:  BP (!) 150/80   Pulse 86   Ht '5\' 4"'$  (1.626 m)   Wt 173 lb 6.4 oz (78.7 kg)   SpO2 98%   BMI 29.76 kg/m    Wt Readings from Last 3 Encounters:  03/09/22 173  lb 6.4 oz (78.7 kg)  01/02/22 167 lb 12.8 oz (76.1 kg)  09/28/21 164 lb 12.8 oz (74.8 kg)    General: Well nourished, well developed, in no acute distress Head: Atraumatic, normal size  Eyes: PEERLA, EOMI  Neck: Supple, no JVD Endocrine: No thryomegaly Cardiac: Normal S1, S2; RRR; no murmurs, rubs, or gallops Lungs: Clear to auscultation bilaterally, no wheezing, rhonchi or rales  Abd: Soft, nontender, no hepatomegaly  Ext: No edema, pulses 2+ Musculoskeletal: No deformities, BUE and BLE strength normal and equal Skin: Warm and dry, no rashes   Neuro: Alert and oriented to person, place, time, and situation, CNII-XII grossly intact, no focal deficits  Psych: Normal mood and affect   ASSESSMENT:   Tracy Young is a 76 y.o. female who presents for the following: 1. Coronary artery calcification seen on computed tomography   2. Mixed hyperlipidemia   3. Primary hypertension     PLAN:   1. Coronary artery calcification seen on computed tomography 2. Mixed hyperlipidemia -Mild coronary calcifications.  No symptoms concerning for angina.  Exercising for 30 to 40 minutes/day without major limitations.  She does have restrictive lung disease which is being followed by pulmonary.  Regarding her calcification she will continue aspirin 81  mg daily.  She is having cramping with Lipitor.  We will try Crestor 20 mg daily.  She will see me back in 6 months to discuss further.  LDL close to goal.  3. Primary hypertension -Blood pressure elevated.  We will start losartan 25 mg daily.  Hopefully this will help.  Continue amlodipine 5 mg daily.  She will let us know if values continue to remain out of range.  Disposition: Return in about 6 months (around 09/08/2022).  Medication Adjustments/Labs and Tests Ordered: Current medicines are reviewed at length with the patient today.  Concerns regarding medicines are outlined above.  No orders of the defined types were placed in this encounter.  Meds ordered this encounter  Medications   rosuvastatin (CRESTOR) 20 MG tablet    Sig: Take 1 tablet (20 mg total) by mouth daily.    Dispense:  90 tablet    Refill:  3   losartan (COZAAR) 25 MG tablet    Sig: Take 1 tablet (25 mg total) by mouth daily.    Dispense:  90 tablet    Refill:  3    Patient Instructions  Medication Instructions:  STOP Lipitor  START Crestor 20 mg daily  START Losartan 25 mg daily   *If you need a refill on your cardiac medications before your next appointment, please call your pharmacy*   Follow-Up: At [facility], you and your health needs are our priority.  As part of our continuing mission to provide you with exceptional heart care, we have created designated Provider Care Teams.  These Care Teams include your primary Cardiologist (physician) and Advanced Practice Providers (APPs -  Physician Assistants and Nurse Practitioners) who all work together to provide you with the care you need, when you need it.  We recommend signing up for the patient portal called "MyChart".  Sign up information is provided on this After Visit Summary.  MyChart is used to connect with patients for Virtual Visits (Telemedicine).  Patients are able to view lab/test results, encounter notes, upcoming appointments, etc.   Non-urgent messages can be sent to your provider as well.   To learn more about what you can do with MyChart, go to [email or web address].    Your  next appointment:   6 month(s)  The format for your next appointment:   In Person  Provider:   Eleonore Chiquito, MD             Time Spent with Patient: I have spent a total of 35 minutes with patient reviewing hospital notes, telemetry, EKGs, labs and examining the patient as well as establishing an assessment and plan that was discussed with the patient.  > 50% of time was spent in direct patient care.  Signed, Addison Naegeli. Audie Box, MD, [place] [facility]  [address], [place], [place] [address] [phone]  03/09/2022 9:00 AM

## 2022-03-06 NOTE — Telephone Encounter (Signed)
Scheduled appt per 6/19 referral. Pt is aware of appt date and time. Pt is aware to arrive 15 mins prior to appt time and to bring and updated insurance card. Pt is aware of appt location.   

## 2022-03-07 ENCOUNTER — Encounter (HOSPITAL_BASED_OUTPATIENT_CLINIC_OR_DEPARTMENT_OTHER): Payer: Medicare Other | Admitting: Physical Therapy

## 2022-03-09 ENCOUNTER — Encounter: Payer: Self-pay | Admitting: Cardiovascular Disease

## 2022-03-09 ENCOUNTER — Encounter (HOSPITAL_BASED_OUTPATIENT_CLINIC_OR_DEPARTMENT_OTHER): Payer: Medicare Other | Admitting: Physical Therapy

## 2022-03-09 ENCOUNTER — Ambulatory Visit (INDEPENDENT_AMBULATORY_CARE_PROVIDER_SITE_OTHER): Payer: Medicare Other | Admitting: Cardiovascular Disease

## 2022-03-09 VITALS — BP 150/80 | HR 86 | Ht 64.0 in | Wt 173.4 lb

## 2022-03-09 DIAGNOSIS — I10 Essential (primary) hypertension: Secondary | ICD-10-CM

## 2022-03-09 DIAGNOSIS — E782 Mixed hyperlipidemia: Secondary | ICD-10-CM

## 2022-03-09 DIAGNOSIS — I2510 Atherosclerotic heart disease of native coronary artery without angina pectoris: Secondary | ICD-10-CM

## 2022-03-09 MED ORDER — ROSUVASTATIN CALCIUM 20 MG PO TABS: 20.0000 mg | ORAL_TABLET | Freq: Every day | ORAL | 3 refills | Status: DC

## 2022-03-09 MED ORDER — LOSARTAN POTASSIUM 25 MG PO TABS: 25.0000 mg | ORAL_TABLET | Freq: Every day | ORAL | 3 refills | Status: DC

## 2022-03-09 NOTE — Patient Instructions (Signed)
Medication Instructions:  STOP Lipitor  START Crestor 20 mg daily  START Losartan 25 mg daily   *If you need a refill on your cardiac medications before your next appointment, please call your pharmacy*   Follow-Up: At [facility], you and your health needs are our priority.  As part of our continuing mission to provide you with exceptional heart care, we have created designated Provider Care Teams.  These Care Teams include your primary Cardiologist (physician) and Advanced Practice Providers (APPs -  Physician Assistants and Nurse Practitioners) who all work together to provide you with the care you need, when you need it.  We recommend signing up for the patient portal called "MyChart".  Sign up information is provided on this After Visit Summary.  MyChart is used to connect with patients for Virtual Visits (Telemedicine).  Patients are able to view lab/test results, encounter notes, upcoming appointments, etc.  Non-urgent messages can be sent to your provider as well.   To learn more about what you can do with MyChart, go to [email or web address].    Your next appointment:   6 month(s)  The format for your next appointment:   In Person  Provider:   Eleonore Chiquito, MD

## 2022-03-14 ENCOUNTER — Encounter (HOSPITAL_BASED_OUTPATIENT_CLINIC_OR_DEPARTMENT_OTHER): Payer: Medicare Other | Admitting: Physical Therapy

## 2022-03-16 ENCOUNTER — Encounter (HOSPITAL_BASED_OUTPATIENT_CLINIC_OR_DEPARTMENT_OTHER): Payer: Medicare Other | Admitting: Physical Therapy

## 2022-03-17 ENCOUNTER — Telehealth: Payer: Self-pay | Admitting: Hematology

## 2022-03-17 NOTE — Telephone Encounter (Signed)
R/s pt's new hem appt. Pt is aware of new appt date and time.  °

## 2022-03-23 ENCOUNTER — Other Ambulatory Visit: Payer: Medicare Other

## 2022-03-23 ENCOUNTER — Encounter: Payer: Medicare Other | Admitting: Hematology and Oncology

## 2022-03-28 DIAGNOSIS — F331 Major depressive disorder, recurrent, moderate: Secondary | ICD-10-CM | POA: Diagnosis not present

## 2022-03-28 DIAGNOSIS — N1831 Chronic kidney disease, stage 3a: Secondary | ICD-10-CM | POA: Diagnosis not present

## 2022-03-28 DIAGNOSIS — K219 Gastro-esophageal reflux disease without esophagitis: Secondary | ICD-10-CM | POA: Diagnosis not present

## 2022-03-28 DIAGNOSIS — I10 Essential (primary) hypertension: Secondary | ICD-10-CM | POA: Diagnosis not present

## 2022-03-28 DIAGNOSIS — E782 Mixed hyperlipidemia: Secondary | ICD-10-CM | POA: Diagnosis not present

## 2022-03-31 ENCOUNTER — Encounter: Payer: Self-pay | Admitting: Hematology

## 2022-03-31 ENCOUNTER — Other Ambulatory Visit: Payer: Self-pay

## 2022-03-31 ENCOUNTER — Inpatient Hospital Stay: Payer: Medicare Other | Attending: Hematology and Oncology | Admitting: Hematology

## 2022-03-31 ENCOUNTER — Inpatient Hospital Stay: Payer: Medicare Other

## 2022-03-31 VITALS — BP 131/75 | HR 102 | Temp 98.4°F | Resp 17 | Wt 177.6 lb

## 2022-03-31 DIAGNOSIS — D72829 Elevated white blood cell count, unspecified: Secondary | ICD-10-CM | POA: Diagnosis not present

## 2022-03-31 DIAGNOSIS — J4521 Mild intermittent asthma with (acute) exacerbation: Secondary | ICD-10-CM

## 2022-03-31 DIAGNOSIS — J9601 Acute respiratory failure with hypoxia: Secondary | ICD-10-CM

## 2022-03-31 LAB — CBC WITH DIFFERENTIAL (CANCER CENTER ONLY)
Abs Immature Granulocytes: 0.4 10*3/uL — ABNORMAL HIGH (ref 0.00–0.07)
Basophils Absolute: 0.1 10*3/uL (ref 0.0–0.1)
Basophils Relative: 1 %
Eosinophils Absolute: 0.2 10*3/uL (ref 0.0–0.5)
Eosinophils Relative: 1 %
HCT: 34.5 % — ABNORMAL LOW (ref 36.0–46.0)
Hemoglobin: 11.5 g/dL — ABNORMAL LOW (ref 12.0–15.0)
Immature Granulocytes: 2 %
Lymphocytes Relative: 20 %
Lymphs Abs: 3.6 10*3/uL (ref 0.7–4.0)
MCH: 32 pg (ref 26.0–34.0)
MCHC: 33.3 g/dL (ref 30.0–36.0)
MCV: 96.1 fL (ref 80.0–100.0)
Monocytes Absolute: 1.9 10*3/uL — ABNORMAL HIGH (ref 0.1–1.0)
Monocytes Relative: 10 %
Neutro Abs: 12.2 10*3/uL — ABNORMAL HIGH (ref 1.7–7.7)
Neutrophils Relative %: 66 %
Platelet Count: 258 10*3/uL (ref 150–400)
RBC: 3.59 MIL/uL — ABNORMAL LOW (ref 3.87–5.11)
RDW: 13 % (ref 11.5–15.5)
WBC Count: 18.4 10*3/uL — ABNORMAL HIGH (ref 4.0–10.5)
nRBC: 0 % (ref 0.0–0.2)

## 2022-03-31 LAB — TECHNOLOGIST SMEAR REVIEW: Plt Morphology: NORMAL

## 2022-03-31 LAB — SAVE SMEAR(SSMR), FOR PROVIDER SLIDE REVIEW

## 2022-03-31 NOTE — Progress Notes (Signed)
[facility]   [phone] [phone]   Clinic New Consult Note   Patient Care Team: Maury Dus, MD as PCP - General (Family Medicine) 03/31/2022  CHIEF COMPLAINTS/PURPOSE OF CONSULTATION:  Leukocytosis with predominant neutrophilia  REFERRING PHYSICIAN: Dr. Alyson Ingles   HISTORY OF PRESENTING ILLNESS:  Tracy Young 76 y.o. female is here because of chronic leukocytosis.  Was referred by her primary care physician Dr. Alyson Ingles.   She has a history of allergy and asthma, uses inhalers which she contains steroids.  She has not used oral steroids except during her COVID treatment in October 2022.  She was in hospital for 2 weeks for COVID infection, and developed long COVID afterwards.  She had fatigue, dyspnea on exertion, and tachycardia.  She was seen by pulmonologist Dr. Vaughan Browner and cardiologist Dr. Davina Poke.  Her overall symptom has improved gradually, she is able to exercise at the gym, she has a mild dyspnea on exertion and tachycardia when she exercises.  She denies any fever, night sweats, or weight loss.  Based on her records in our system, she has had chronic leukocytosis since her first lab in the system in 2012.  Her total white blood cell count has been in the range of 12-20 K, with predominant neutrophils, and intermittent mild monocytosis 1.3-2.4K, no lymphocytosis, or significant increase of other white blood cell.  She has a mild anemia with hemoglobin 11-12, previous count has been normal.  Her liver and spleen size were normal on CT scan in November 2022.  MEDICAL HISTORY:  Past Medical History:  Diagnosis Date   Chronic kidney disease, stage 3a ([place]) 06/27/2021   COVID-19    GERD without esophagitis 06/27/2021   Hypertension    Mild intermittent asthma without complication 63/33/5456   Mixed hyperlipidemia 06/27/2021   Pneumonia     SURGICAL HISTORY: Past Surgical History:  Procedure Laterality Date   TOTAL KNEE ARTHROPLASTY       SOCIAL HISTORY: Social History   Socioeconomic History   Marital status: Married    Spouse name: Not on file   Number of children: 4   Years of education: Not on file   Highest education level: Not on file  Occupational History   Occupation: Retired Press photographer  Tobacco Use   Smoking status: Never    Passive exposure: Never   Smokeless tobacco: Never  Vaping Use   Vaping Use: Never used  Substance and Sexual Activity   Alcohol use: Never   Drug use: Never   Sexual activity: Not on file  Other Topics Concern   Not on file  Social History Narrative   Not on file   Social Determinants of Health   Financial Resource Strain: Not on file  Food Insecurity: Not on file  Transportation Needs: Not on file  Physical Activity: Not on file  Stress: Not on file  Social Connections: Not on file  Intimate Partner Violence: Not on file    FAMILY HISTORY: Family History  Problem Relation Age of Onset   Cancer Mother 71       pancreatic cancer   Heart attack Father    Heart disease Neg Hx    Breast cancer Neg Hx     ALLERGIES:  is allergic to iodine stainless [iodine] and doxycycline.  MEDICATIONS:  Current Outpatient Medications  Medication Sig Dispense Refill   albuterol (VENTOLIN HFA) 108 (90 Base) MCG/ACT inhaler Inhale 2 puffs into the lungs every 4 (four) hours as needed for wheezing or  shortness of breath. 8.5 g 1   amLODipine (NORVASC) 5 MG tablet Take 1 tablet (5 mg total) by mouth daily. 180 tablet 1   Ascorbic Acid (VITAMIN C) 1000 MG tablet Take 1,000 mg by mouth every morning.     aspirin EC 81 MG tablet Take 1 tablet (81 mg total) by mouth daily. Swallow whole. 90 tablet 3   benzonatate (TESSALON PERLES) 100 MG capsule Take 1 capsule (100 mg total) by mouth 3 (three) times daily as needed for cough. 30 capsule 0   buPROPion (WELLBUTRIN XL) 300 MG 24 hr tablet Take 300 mg by mouth daily.     Cholecalciferol (VITAMIN D-3) 125 MCG (5000 UT) TABS Take 5,000 Units  by mouth every morning.     Cyanocobalamin (VITAMIN B-12 PO) Take 1 tablet by mouth every morning.     DULoxetine (CYMBALTA) 60 MG capsule Take 60 mg by mouth at bedtime.     famotidine (PEPCID) 40 MG tablet Take 40 mg by mouth at bedtime.     Fluticasone-Umeclidin-Vilant (TRELEGY ELLIPTA) 200-62.5-25 MCG/ACT AEPB INHALE ONE PUFF BY MOUTH DAILY 60 each 5   losartan (COZAAR) 25 MG tablet Take 1 tablet (25 mg total) by mouth daily. 90 tablet 3   Multiple Minerals-Vitamins (CALCIUM CITRATE-MAG-MINERALS) TABS Take 1 tablet by mouth every morning. Calcium Citrate plus magnesium and zinc     NYAMYC powder Apply 1 application topically daily. Apply to legs after showering     pantoprazole (PROTONIX) 40 MG tablet Take 40 mg by mouth every morning.     raloxifene (EVISTA) 60 MG tablet Take 60 mg by mouth daily.     rosuvastatin (CRESTOR) 20 MG tablet Take 1 tablet (20 mg total) by mouth daily. 90 tablet 3   No current facility-administered medications for this visit.    REVIEW OF SYSTEMS:   Constitutional: Denies fevers, chills or abnormal night sweats, mild fatigue  Eyes: Denies blurriness of vision, double vision or watery eyes Ears, nose, mouth, throat, and face: Denies mucositis or sore throat Respiratory: Denies cough, (+)dyspnea on exertion, no wheezes Cardiovascular: Denies palpitation, chest discomfort or lower extremity swelling Gastrointestinal:  Denies nausea, heartburn or change in bowel habits Skin: Denies abnormal skin rashes Lymphatics: Denies new lymphadenopathy or easy bruising Neurological:Denies numbness, tingling or new weaknesses Behavioral/Psych: Mood is stable, no new changes  All other systems were reviewed with the patient and are negative.  PHYSICAL EXAMINATION: ECOG PERFORMANCE STATUS: 1 - Symptomatic but completely ambulatory  Vitals:   03/31/22 1112  BP: 131/75  Pulse: (!) 102  Resp: 17  Temp: 98.4 F (36.9 C)  SpO2: 97%   Filed Weights   03/31/22 1112   Weight: 177 lb 9 oz (80.5 kg)    GENERAL:alert, no distress and comfortable SKIN: skin color, texture, turgor are normal, no rashes or significant lesions EYES: normal, conjunctiva are pink and non-injected, sclera clear OROPHARYNX:no exudate, no erythema and lips, buccal mucosa, and tongue normal  NECK: supple, thyroid normal size, non-tender, without nodularity LYMPH:  no palpable lymphadenopathy in the cervical, axillary or inguinal LUNGS: clear to auscultation and percussion with normal breathing effort HEART: regular rate & rhythm and no murmurs and no lower extremity edema ABDOMEN:abdomen soft, non-tender and normal bowel sounds Musculoskeletal:no cyanosis of digits and no clubbing  PSYCH: alert & oriented x 3 with fluent speech NEURO: no focal motor/sensory deficits  LABORATORY DATA:  I have reviewed the data as listed    Latest Ref Rng & Units 03/31/2022  11:54 AM 07/03/2021    1:20 AM 07/02/2021    2:10 AM  CBC  WBC 4.0 - 10.5 K/uL 18.4  17.7  17.8   Hemoglobin 12.0 - 15.0 g/dL 11.5  11.6  11.2   Hematocrit 36.0 - 46.0 % 34.5  33.5  32.5   Platelets 150 - 400 K/uL 258  307  289     RADIOGRAPHIC STUDIES: I have personally reviewed the radiological images as listed and agreed with the findings in the report. No results found.  ASSESSMENT & PLAN: 76 year old female with past medical history of seasonal allergies and asthma, COVID infection in October 2022 and subsequent long COVID, was referred for chronic leukocytosis  Chronic leukocytosis with predominant neutrophilia -This has been ongoing since 2012, the first available lab work in our system.  Her total WBC has been in the range of 10-20 K -I suspect this is reactive, secondary to her asthma and allergy, and intermittent infections especially bronchitis. -We will rule out a CML, will obtain BCR able FISH -I have very low suspicion desisted MPN, given no other cytosis, and normal spleen size. -Will review her  peripheral blood smear. -If bcr/abl negative, I do not plan to do further work-up. -I will call her with lab results.    Orders Placed This Encounter  Procedures   CBC with Differential ([facility] Only)    Standing Status:   Future    Number of Occurrences:   1    Standing Expiration Date:   04/01/2023   Save Smear [facility])    Standing Status:   Future    Number of Occurrences:   1    Standing Expiration Date:   04/01/2023   Technologist smear review    Standing Status:   Future    Number of Occurrences:   1    Standing Expiration Date:   04/01/2023    All questions were answered. The patient knows to call the clinic with any problems, questions or concerns. I spent 30 minutes counseling the patient face to face. The total time spent in the appointment was 40 minutes and more than 50% was on counseling.     Truitt Merle, MD 03/31/2022

## 2022-04-06 DIAGNOSIS — M5412 Radiculopathy, cervical region: Secondary | ICD-10-CM | POA: Diagnosis not present

## 2022-04-06 DIAGNOSIS — G603 Idiopathic progressive neuropathy: Secondary | ICD-10-CM | POA: Diagnosis not present

## 2022-04-06 DIAGNOSIS — Z79899 Other long term (current) drug therapy: Secondary | ICD-10-CM | POA: Diagnosis not present

## 2022-04-06 DIAGNOSIS — R269 Unspecified abnormalities of gait and mobility: Secondary | ICD-10-CM | POA: Diagnosis not present

## 2022-04-06 DIAGNOSIS — M545 Low back pain, unspecified: Secondary | ICD-10-CM | POA: Diagnosis not present

## 2022-04-07 LAB — BCR ABL1 FISH (GENPATH)

## 2022-04-26 ENCOUNTER — Emergency Department (HOSPITAL_BASED_OUTPATIENT_CLINIC_OR_DEPARTMENT_OTHER): Payer: Medicare Other

## 2022-04-26 ENCOUNTER — Emergency Department (HOSPITAL_BASED_OUTPATIENT_CLINIC_OR_DEPARTMENT_OTHER)
Admission: EM | Admit: 2022-04-26 | Discharge: 2022-04-26 | Disposition: A | Discharge status: Home / Self Care | Payer: Medicare Other | Attending: Emergency Medicine | Admitting: Emergency Medicine

## 2022-04-26 ENCOUNTER — Encounter (HOSPITAL_BASED_OUTPATIENT_CLINIC_OR_DEPARTMENT_OTHER): Payer: Self-pay | Admitting: Emergency Medicine

## 2022-04-26 ENCOUNTER — Encounter: Payer: Self-pay | Admitting: Cardiovascular Disease

## 2022-04-26 ENCOUNTER — Other Ambulatory Visit: Payer: Self-pay

## 2022-04-26 DIAGNOSIS — S0990XA Unspecified injury of head, initial encounter: Secondary | ICD-10-CM

## 2022-04-26 DIAGNOSIS — S50811A Abrasion of right forearm, initial encounter: Secondary | ICD-10-CM | POA: Diagnosis not present

## 2022-04-26 DIAGNOSIS — W19XXXA Unspecified fall, initial encounter: Secondary | ICD-10-CM

## 2022-04-26 DIAGNOSIS — S0003XA Contusion of scalp, initial encounter: Secondary | ICD-10-CM | POA: Insufficient documentation

## 2022-04-26 DIAGNOSIS — W01198A Fall on same level from slipping, tripping and stumbling with subsequent striking against other object, initial encounter: Secondary | ICD-10-CM | POA: Diagnosis not present

## 2022-04-26 DIAGNOSIS — T148XXA Other injury of unspecified body region, initial encounter: Secondary | ICD-10-CM

## 2022-04-26 DIAGNOSIS — S0001XA Abrasion of scalp, initial encounter: Secondary | ICD-10-CM | POA: Diagnosis not present

## 2022-04-26 NOTE — Discharge Instructions (Signed)
Hold amlodipine and follow-up with your doctor as discussed. Stay well-hydrated with water.  If you feel lightheaded or dizzy please sit or lie down in a safe area. Tylenol as needed for pain.  Wound care as discussed.

## 2022-04-26 NOTE — ED Triage Notes (Signed)
Pt here from home with c/o fall yesterday hitting the back of her head no loc , no blood thinners

## 2022-04-26 NOTE — ED Provider Notes (Signed)
[facility] EMERGENCY DEPT Provider Note   CSN: [id number] Arrival date & time: 04/26/22  1034     History  Chief Complaint  Patient presents with   Tracy Young is a 76 y.o. female.  Patient presents for assessment since fall at home yesterday.  Patient has had intermittent episodes of dizziness and lightheadedness since having COVID and also being on amlodipine.  Patient denies any syncope however she did hit her head.  With intermittent dizziness today she wanted to get evaluated.  Patient is on aspirin.  No neurologic signs or symptoms.  Patient had mild injury to the right arm no pain with movement however skin abrasion and put a Band-Aid on.       Home Medications Prior to Admission medications   Medication Sig Start Date End Date Taking? Authorizing Provider  albuterol (VENTOLIN HFA) 108 (90 Base) MCG/ACT inhaler Inhale 2 puffs into the lungs every 4 (four) hours as needed for wheezing or shortness of breath. 07/04/21   Ghimire, Henreitta Leber, MD  amLODipine (NORVASC) 5 MG tablet Take 1 tablet (5 mg total) by mouth daily. 01/02/22 12/28/22  O'NealCassie Freer, MD  Ascorbic Acid (VITAMIN C) 1000 MG tablet Take 1,000 mg by mouth every morning.    [provider]  aspirin EC 81 MG tablet Take 1 tablet (81 mg total) by mouth daily. Swallow whole. 08/25/21   O'NealCassie Freer, MD  benzonatate (TESSALON PERLES) 100 MG capsule Take 1 capsule (100 mg total) by mouth 3 (three) times daily as needed for cough. 07/04/21 07/04/22  Ghimire, Henreitta Leber, MD  buPROPion (WELLBUTRIN XL) 300 MG 24 hr tablet Take 300 mg by mouth daily. 04/02/21   [provider]  Cholecalciferol (VITAMIN D-3) 125 MCG (5000 UT) TABS Take 5,000 Units by mouth every morning.    [provider]  Cyanocobalamin (VITAMIN B-12 PO) Take 1 tablet by mouth every morning.    [provider]  DULoxetine (CYMBALTA) 60 MG capsule Take 60 mg by mouth at bedtime.     [provider]  famotidine (PEPCID) 40 MG tablet Take 40 mg by mouth at bedtime.    [provider]  Fluticasone-Umeclidin-Vilant (TRELEGY ELLIPTA) 200-62.5-25 MCG/ACT AEPB INHALE ONE PUFF BY MOUTH DAILY 01/11/22   Mannam, Hart Robinsons, MD  losartan (COZAAR) 25 MG tablet Take 1 tablet (25 mg total) by mouth daily. 03/09/22 03/04/23  O'NealCassie Freer, MD  Multiple Minerals-Vitamins (CALCIUM CITRATE-MAG-MINERALS) TABS Take 1 tablet by mouth every morning. Calcium Citrate plus magnesium and zinc    [provider]  [facility] powder Apply 1 application topically daily. Apply to legs after showering 06/19/21   [provider]  pantoprazole (PROTONIX) 40 MG tablet Take 40 mg by mouth every morning. 04/02/21   [provider]  raloxifene (EVISTA) 60 MG tablet Take 60 mg by mouth daily. 04/02/21   [provider]  rosuvastatin (CRESTOR) 20 MG tablet Take 1 tablet (20 mg total) by mouth daily. 03/09/22 03/04/23  O'NealCassie Freer, MD      Allergies    Iodine stainless [iodine] and Doxycycline    Review of Systems   Review of Systems  Constitutional:  Negative for chills and fever.  HENT:  Negative for congestion.   Eyes:  Negative for visual disturbance.  Respiratory:  Negative for shortness of breath.   Cardiovascular:  Negative for chest pain.  Gastrointestinal:  Negative for abdominal pain and vomiting.  Genitourinary:  Negative for dysuria  and flank pain.  Musculoskeletal:  Negative for back pain, neck pain and neck stiffness.  Skin:  Positive for wound. Negative for rash.  Neurological:  Positive for dizziness, light-headedness and headaches.    Physical Exam Updated Vital Signs BP 126/68 (BP Location: Left Arm)   Pulse (!) 113   Temp 98.7 F (37.1 C) (Oral)   Resp 18   Ht '5\' 4"'$  (1.626 m)   Wt 79.4 kg   SpO2 100%   BMI 30.04 kg/m  Physical Exam Vitals and nursing note reviewed.  Constitutional:      General: She is not in acute  distress.    Appearance: She is well-developed.  HENT:     Head: Normocephalic.     Comments: Patient has small contusion and superficial hematoma posterior superior scalp.  No step-off.    Mouth/Throat:     Mouth: Mucous membranes are moist.  Eyes:     General:        Right eye: No discharge.        Left eye: No discharge.     Conjunctiva/sclera: Conjunctivae normal.  Neck:     Trachea: No tracheal deviation.  Cardiovascular:     Rate and Rhythm: Normal rate and regular rhythm.     Heart sounds: No murmur heard. Pulmonary:     Effort: Pulmonary effort is normal.     Breath sounds: Normal breath sounds.  Abdominal:     General: There is no distension.     Tenderness: There is no abdominal tenderness. There is no guarding.  Musculoskeletal:     Cervical back: Normal range of motion and neck supple. No rigidity.  Skin:    General: Skin is warm.     Capillary Refill: Capillary refill takes less than 2 seconds.     Findings: Bruising present.     Comments: Patient has superficial abrasion and skin tear approximately 3 cm in 2 areas lateral proximal right forearm without active bleeding or gaping.  No bony tenderness to the right arm.  No midline cervical tenderness full range of motion in neck.  Neurological:     General: No focal deficit present.     Mental Status: She is alert.     Cranial Nerves: No cranial nerve deficit.     Sensory: No sensory deficit.     Motor: No weakness.     Coordination: Coordination normal.  Psychiatric:        Mood and Affect: Mood normal.     ED Results / Procedures / Treatments   Labs (all labs ordered are listed, but only abnormal results are displayed) Labs Reviewed - No data to display  EKG None  Radiology CT Head Wo Contrast  Result Date: 04/26/2022 CLINICAL DATA:  Head trauma, minor (Age >= 65y) EXAM: CT HEAD WITHOUT CONTRAST TECHNIQUE: Contiguous axial images were obtained from the base of the skull through the vertex without  intravenous contrast. RADIATION DOSE REDUCTION: This exam was performed according to the departmental dose-optimization program which includes automated exposure control, adjustment of the mA and/or kV according to patient size and/or use of iterative reconstruction technique. COMPARISON:  CT head August 12, 2021. FINDINGS: Brain: No evidence of acute infarction, hemorrhage, hydrocephalus, extra-axial collection or mass lesion/mass effect. Patchy white matter hypoattenuation, nonspecific but compatible with chronic microvascular ischemic disease. Vascular: No hyperdense vessel identified. Skull: No acute fracture.  High posterior left scalp contusion. Sinuses/Orbits: Clear sinuses. No acute orbital findings. Left scleral banding. Other: No mastoid effusions. IMPRESSION:  1. No evidence of acute abnormality. 2. High posterior left scalp contusion without acute calvarial fracture. Electronically Signed   By: Margaretha Sheffield M.D.   On: 04/26/2022 11:26    Procedures Procedures    Medications Ordered in ED Medications - No data to display  ED Course/ Medical Decision Making/ A&P                           Medical Decision Making Amount and/or Complexity of Data Reviewed Radiology: ordered.   Patient presents for assessment of intermittent dizziness since overall lower risk head injury.  CT scan of the head ordered and reviewed independently no acute fracture or bleeding small contusion.  Basic wound care discussed with nursing for superficial skin tear.  Patient has local physicians to continue outpatient follow-up.  Reviewed blood work performed 3 weeks ago and hemoglobin stable at 11.  Patient comfortable with continued outpatient follow-up.  Discussed in detail holding amlodipine to see if it helps with her intermittent lightheaded and dizziness.  No clinical concern for stroke at this time.        Final Clinical Impression(s) / ED Diagnoses Final diagnoses:  Acute head injury, initial  encounter  Contusion of scalp, initial encounter  Skin abrasion  Fall, initial encounter    Rx / DC Orders ED Discharge Orders     None         Elnora Morrison, MD 04/26/22 1238

## 2022-04-27 DIAGNOSIS — I10 Essential (primary) hypertension: Secondary | ICD-10-CM | POA: Diagnosis not present

## 2022-04-27 DIAGNOSIS — G4700 Insomnia, unspecified: Secondary | ICD-10-CM | POA: Diagnosis not present

## 2022-04-27 DIAGNOSIS — I2510 Atherosclerotic heart disease of native coronary artery without angina pectoris: Secondary | ICD-10-CM | POA: Diagnosis not present

## 2022-04-27 DIAGNOSIS — K219 Gastro-esophageal reflux disease without esophagitis: Secondary | ICD-10-CM | POA: Diagnosis not present

## 2022-04-27 DIAGNOSIS — N1831 Chronic kidney disease, stage 3a: Secondary | ICD-10-CM | POA: Diagnosis not present

## 2022-04-27 DIAGNOSIS — F331 Major depressive disorder, recurrent, moderate: Secondary | ICD-10-CM | POA: Diagnosis not present

## 2022-04-27 DIAGNOSIS — E782 Mixed hyperlipidemia: Secondary | ICD-10-CM | POA: Diagnosis not present

## 2022-05-08 MED ORDER — LOSARTAN POTASSIUM 25 MG PO TABS: 25.0000 mg | ORAL_TABLET | Freq: Two times a day (BID) | ORAL | 3 refills | Status: DC

## 2022-05-08 NOTE — Telephone Encounter (Signed)
Medication changes and e- sent to pharmacy requested.

## 2022-05-08 NOTE — Addendum Note (Signed)
Addended by: Raiford Simmonds on: 05/08/2022 08:26 AM   Modules accepted: Orders

## 2022-05-15 DIAGNOSIS — H26492 Other secondary cataract, left eye: Secondary | ICD-10-CM | POA: Diagnosis not present

## 2022-05-25 DIAGNOSIS — Z23 Encounter for immunization: Secondary | ICD-10-CM | POA: Diagnosis not present

## 2022-06-16 DIAGNOSIS — J0100 Acute maxillary sinusitis, unspecified: Secondary | ICD-10-CM | POA: Diagnosis not present

## 2022-06-16 DIAGNOSIS — R42 Dizziness and giddiness: Secondary | ICD-10-CM | POA: Diagnosis not present

## 2022-06-27 DIAGNOSIS — E782 Mixed hyperlipidemia: Secondary | ICD-10-CM | POA: Diagnosis not present

## 2022-06-27 DIAGNOSIS — N1831 Chronic kidney disease, stage 3a: Secondary | ICD-10-CM | POA: Diagnosis not present

## 2022-06-27 DIAGNOSIS — K219 Gastro-esophageal reflux disease without esophagitis: Secondary | ICD-10-CM | POA: Diagnosis not present

## 2022-06-27 DIAGNOSIS — I10 Essential (primary) hypertension: Secondary | ICD-10-CM | POA: Diagnosis not present

## 2022-06-27 DIAGNOSIS — F331 Major depressive disorder, recurrent, moderate: Secondary | ICD-10-CM | POA: Diagnosis not present

## 2022-08-03 DIAGNOSIS — J4520 Mild intermittent asthma, uncomplicated: Secondary | ICD-10-CM | POA: Diagnosis not present

## 2022-08-03 DIAGNOSIS — I10 Essential (primary) hypertension: Secondary | ICD-10-CM | POA: Diagnosis not present

## 2022-08-03 DIAGNOSIS — N1831 Chronic kidney disease, stage 3a: Secondary | ICD-10-CM | POA: Diagnosis not present

## 2022-08-03 DIAGNOSIS — M8588 Other specified disorders of bone density and structure, other site: Secondary | ICD-10-CM | POA: Diagnosis not present

## 2022-08-03 DIAGNOSIS — R7301 Impaired fasting glucose: Secondary | ICD-10-CM | POA: Diagnosis not present

## 2022-08-03 DIAGNOSIS — B356 Tinea cruris: Secondary | ICD-10-CM | POA: Diagnosis not present

## 2022-08-03 DIAGNOSIS — E782 Mixed hyperlipidemia: Secondary | ICD-10-CM | POA: Diagnosis not present

## 2022-08-03 DIAGNOSIS — K219 Gastro-esophageal reflux disease without esophagitis: Secondary | ICD-10-CM | POA: Diagnosis not present

## 2022-08-03 DIAGNOSIS — F331 Major depressive disorder, recurrent, moderate: Secondary | ICD-10-CM | POA: Diagnosis not present

## 2022-08-11 ENCOUNTER — Other Ambulatory Visit: Payer: Self-pay | Admitting: Pulmonary Disease

## 2022-08-18 ENCOUNTER — Other Ambulatory Visit: Payer: Self-pay

## 2022-08-18 ENCOUNTER — Ambulatory Visit: Payer: Medicare Other | Attending: Family Medicine

## 2022-08-18 DIAGNOSIS — R262 Difficulty in walking, not elsewhere classified: Secondary | ICD-10-CM | POA: Insufficient documentation

## 2022-08-18 DIAGNOSIS — R42 Dizziness and giddiness: Secondary | ICD-10-CM | POA: Insufficient documentation

## 2022-08-18 DIAGNOSIS — R2681 Unsteadiness on feet: Secondary | ICD-10-CM | POA: Diagnosis not present

## 2022-08-18 DIAGNOSIS — M6281 Muscle weakness (generalized): Secondary | ICD-10-CM | POA: Diagnosis not present

## 2022-08-18 NOTE — Therapy (Signed)
OUTPATIENT PHYSICAL THERAPY VESTIBULAR EVALUATION     Patient Name: Tracy Young MRN: [id number] DOB:18-Mar-1946, 76 y.o., female Today's Date: 08/18/2022  END OF SESSION:  PT End of Session - 08/18/22 0758     Visit Number 1    Number of Visits 12    Date for PT Re-Evaluation 09/29/22    Authorization Type Medicare A & B    Progress Note Due on Visit 10    PT Start Time 0800    PT Stop Time 0845    PT Time Calculation (min) 45 min             Past Medical History:  Diagnosis Date   Chronic kidney disease, stage 3a ([place]) 06/27/2021   COVID-19    GERD without esophagitis 06/27/2021   Hypertension    Mild intermittent asthma without complication 84/13/2440   Mixed hyperlipidemia 06/27/2021   Pneumonia    Past Surgical History:  Procedure Laterality Date   TOTAL KNEE ARTHROPLASTY     Patient Active Problem List   Diagnosis Date Noted   COVID-19 virus infection 06/27/2021   Acute respiratory failure with hypoxia ([place]) 06/27/2021   Chronic kidney disease, stage 3a ([place]) 06/27/2021   GERD without esophagitis 06/27/2021   Mixed hyperlipidemia 06/27/2021   Mild intermittent asthma with (acute) exacerbation 06/27/2021   Sepsis due to COVID-19 ([place]) 06/27/2021    PCP: Maury Dus, MD REFERRING PROVIDER: Maury Dus, MD  REFERRING DIAG: R42 (ICD-10-CM) - Dizziness and giddiness  THERAPY DIAG:  Dizziness and giddiness  Unsteadiness on feet  ONSET DATE: July-August 2023  Rationale for Evaluation and Treatment: Rehabilitation  SUBJECTIVE:   SUBJECTIVE STATEMENT: Pt has experienced 6-7 falls with dizziness preceding this.  Pt notes that possible blood pressure medication interaction was a culprit and they have done some reconfiguration of her medication.  Now notes that the BP issues has been fairly well-regulated and notes dizziness is brought on by looking up and lying down provoke. Denies photo/phonophobia, no neck pain, no HA  Pt accompanied by:  significant other  PERTINENT HISTORY: Patient presents for assessment since fall at home yesterday. Patient has had intermittent episodes of dizziness and lightheadedness since having COVID and also being on amlodipine. Patient denies any syncope however she did hit her head. With intermittent dizziness today she wanted to get evaluated. Patient is on aspirin. No neurologic signs or symptoms. Patient had mild injury to the right arm no pain with movement however skin abrasion and put a Band-Aid on.  PAIN:  Are you having pain? No  PRECAUTIONS: Fall  WEIGHT BEARING RESTRICTIONS: No  FALLS: Has patient fallen in last 6 months? Yes. Number of falls 6-7  LIVING ENVIRONMENT: Lives with: lives with their spouse Lives in: House/apartment Stairs: Yes: Internal: does not use steps; bilateral but cannot reach both and External: 3-4 steps; on right going up Has following equipment at home: Walker - 4 wheeled, uses rollator at night  PLOF: Independent  PATIENT GOALS: eliminate symptoms  OBJECTIVE:   DIAGNOSTIC FINDINGS: IMPRESSION: 1. No evidence of acute abnormality. 2. High posterior left scalp contusion without acute calvarial fracture.  COGNITION: Overall cognitive status: Within functional limits for tasks assessed   SENSATION: WFL  EDEMA:    MUSCLE TONE:    DTRs:    POSTURE:  No Significant postural limitations  Cervical ROM:    Active A/PROM (deg) eval  Flexion WNL  Extension WNL  Right lateral flexion WNL  Left lateral flexion WNL  Right rotation  WNL  Left rotation WNL  (Blank rows = not tested)  Looking up brings on symptoms  STRENGTH:   LOWER EXTREMITY MMT:   MMT Right eval Left eval  Hip flexion    Hip abduction    Hip adduction    Hip internal rotation    Hip external rotation    Knee flexion    Knee extension    Ankle dorsiflexion    Ankle plantarflexion    Ankle inversion    Ankle eversion    (Blank rows = not tested)  BED MOBILITY:   independent  TRANSFERS: Assistive device utilized: None  Sit to stand: Complete Independence Stand to sit: Complete Independence Chair to chair: Complete Independence Floor:  DNT  RAMP:   CURB:   GAIT: Gait pattern: decreased stride length Distance walked:  Assistive device utilized: None Level of assistance: Complete Independence Comments: ambulates with short stride/increased double limb support time, somewhat tentative in her confidence for gait  FUNCTIONAL TESTS:    PATIENT SURVEYS:    VESTIBULAR ASSESSMENT:  GENERAL OBSERVATION: wears bifocals   SYMPTOM BEHAVIOR:  Subjective history: hx of long-COVID and notes pervasive symptoms ever since  Non-Vestibular symptoms:  none  Type of dizziness: Spinning/Vertigo and Unsteady with head/body turns  Frequency: position change  Duration: 3-7 sec  Aggravating factors: Induced by position change: lying supine and supine to sit and Worse in the dark  Relieving factors: closing eyes and slow movements  Progression of symptoms: unchanged  OCULOMOTOR EXAM:  Ocular Alignment: normal  Ocular ROM: No Limitations  Spontaneous Nystagmus: absent  Gaze-Induced Nystagmus: absent  Smooth Pursuits: intact  Saccades: hypometric/undershoots  Convergence/Divergence: 4 cm     VESTIBULAR - OCULAR REFLEX:   Slow VOR: Normal  VOR Cancellation: Normal  Head-Impulse Test: HIT Right: negative HIT Left: negative  Dynamic Visual Acuity: Not able to be assessed   POSITIONAL TESTING: Right Dix-Hallpike: no nystagmus and experiences spinning Left Dix-Hallpike: no nystagmus and minimal symptoms as compared to right Right Roll Test: no nystagmus and feels spinning 3-7 sec Left Roll Test: no nystagmus and less as compared to right  When transitioning Supine to sit: around 25-35 degrees elevation she feels most dramatic spinning sensation  MOTION SENSITIVITY:  Motion Sensitivity Quotient Intensity: 0 = none, 1 = Lightheaded, 2 = Mild, 3  = Moderate, 4 = Severe, 5 = Vomiting  Intensity  1. Sitting to supine   2. Supine to L side   3. Supine to R side   4. Supine to sitting   5. L Hallpike-Dix   6. Up from L    7. R Hallpike-Dix   8. Up from R    9. Sitting, head tipped to L knee   10. Head up from L knee   11. Sitting, head tipped to R knee   12. Head up from R knee   13. Sitting head turns x5   14.Sitting head nods x5   15. In stance, 180 turn to L    16. In stance, 180 turn to R     OTHOSTATICS: not done  FUNCTIONAL GAIT: Functional gait assessment: NT  M-CTSIB  Condition 1: Firm Surface, EO 30 Sec, Normal Sway  Condition 2: Firm Surface, EC 30 Sec, Mild Sway  Condition 3: Foam Surface, EO 30 Sec, Mild Sway  Condition 4: Foam Surface, EC 6 Sec, Severe Sway     VESTIBULAR TREATMENT:  DATE:   Canalith Repositioning:  Comment: unable to fully determine to attempt Gaze Adaptation:  N/A Habituation:  Brandt-Daroff: comment: symptoms with right vs left and Repeated Rolling: number of reps: 5 Other:   PATIENT EDUCATION: Education details: assessment findings and rationale Person educated: Patient and Spouse Education method: Explanation, Demonstration, and Handouts Education comprehension: needs further education  HOME EXERCISE PROGRAM: Access Code: [email or web address] URL: [email or web address] Date: 08/18/2022 Prepared by: Sherlyn Lees  Exercises - Brandt-Daroff Vestibular Exercise  - 1 x daily - 7 x weekly - 1-3 sets - 5 reps - Supine to Right Sidelying Vestibular Habituation  - 1 x daily - 7 x weekly - 1-3 sets - 5 reps  GOALS: Goals reviewed with patient? Yes  SHORT TERM GOALS: Target date: 09/08/2022    Patient will be independent in HEP to improve functional outcomes Baseline: Goal status: INITIAL  2.  Patient will be free of positional vertigo to improve quality of life and reduce  unsteadiness and risk for falls Baseline: symptoms with arising from supine and right rolling Goal status: INITIAL    LONG TERM GOALS: Target date: 09/29/2022    Demo improved vestibular function and safety in low light conditions per mild sway x 30 sec condition 4 M-CTSIB Baseline: 6 seconds, severe Goal status: INITIAL  2.  Manifest improved balance and low risk for falls per score 25/30 Functional Gait Assessment to improve safety with household and community mobility Baseline: NT Goal status: INITIAL    ASSESSMENT:  CLINICAL IMPRESSION: Patient is a 76 y.o. lady who was seen today for physical therapy evaluation and treatment for dizziness and unsteadiness. Demonstrates instances of positional dizziness but unable to identify obvious nystagmus but presenting with dizziness in right Roll Test and right Dix-Hallpike as compared to the left.  Pt also demo decreased vestibular function via M-CTSIB assessment with severe sway and LOB in condition 4 after 6 seconds and pt endorses difficulty in walking house at night in low light conditions as well as when performing bathing activities and her head is extended and/or eyes closed.  Patient would benefit from PT services to address deficits and limitations to restore capabilities to PLOF and reduce risk for falls   OBJECTIVE IMPAIRMENTS: Abnormal gait, decreased activity tolerance, decreased balance, decreased knowledge of use of DME, difficulty walking, and dizziness.   ACTIVITY LIMITATIONS: carrying, lifting, bending, standing, stairs, bathing, reach over head, and locomotion level  PARTICIPATION LIMITATIONS: meal prep, laundry, driving, and community activity  PERSONAL FACTORS: Time since onset of injury/illness/exacerbation and 1 comorbidity: COVID, HTN  are also affecting patient's functional outcome.   REHAB POTENTIAL: Good  CLINICAL DECISION MAKING: Stable/uncomplicated  EVALUATION COMPLEXITY: Low   PLAN:  PT FREQUENCY:  1-2x/week, plan is 2x/wk x 3 wks then 1x/wk x 3 wks  PT DURATION: 6 weeks  PLANNED INTERVENTIONS: Therapeutic exercises, Therapeutic activity, Neuromuscular re-education, Balance training, Gait training, Patient/Family education, Self Care, Joint mobilization, Stair training, Vestibular training, Canalith repositioning, DME instructions, Aquatic Therapy, Dry Needling, Electrical stimulation, Spinal mobilization, Cryotherapy, Moist heat, Taping, and Manual therapy  PLAN FOR NEXT SESSION: Functional Gait Assessment, Re-check positional vertigo, HEP review/update   9:09 AM, 08/18/22 M. Sherlyn Lees, PT, DPT Physical Therapist- [facility] Office Number: [phone]

## 2022-08-18 NOTE — Therapy (Signed)
OUTPATIENT PHYSICAL THERAPY VESTIBULAR TREATMENT     Patient Name: KEYLI DUROSS MRN: [id number] DOB:03/04/1946, 76 y.o., female Today's Date: 08/21/2022  END OF SESSION:  PT End of Session - 08/21/22 1614     Visit Number 2    Number of Visits 12    Date for PT Re-Evaluation 09/29/22    Authorization Type Medicare A & B    Progress Note Due on Visit 10    PT Start Time 1534    PT Stop Time 1612    PT Time Calculation (min) 38 min    Equipment Utilized During Treatment Gait belt    Activity Tolerance Patient tolerated treatment well    Behavior During Therapy WFL for tasks assessed/performed              Past Medical History:  Diagnosis Date   Chronic kidney disease, stage 3a ([place]) 06/27/2021   COVID-19    GERD without esophagitis 06/27/2021   Hypertension    Mild intermittent asthma without complication 86/57/8469   Mixed hyperlipidemia 06/27/2021   Pneumonia    Past Surgical History:  Procedure Laterality Date   TOTAL KNEE ARTHROPLASTY     Patient Active Problem List   Diagnosis Date Noted   COVID-19 virus infection 06/27/2021   Acute respiratory failure with hypoxia ([place]) 06/27/2021   Chronic kidney disease, stage 3a ([place]) 06/27/2021   GERD without esophagitis 06/27/2021   Mixed hyperlipidemia 06/27/2021   Mild intermittent asthma with (acute) exacerbation 06/27/2021   Sepsis due to COVID-19 ([place]) 06/27/2021    PCP: Maury Dus, MD REFERRING PROVIDER: Maury Dus, MD  REFERRING DIAG: R42 (ICD-10-CM) - Dizziness and giddiness  THERAPY DIAG:  Dizziness and giddiness  Unsteadiness on feet  Muscle weakness (generalized)  Difficulty in walking, not elsewhere classified  ONSET DATE: July-August 2023  Rationale for Evaluation and Treatment: Rehabilitation  SUBJECTIVE:   SUBJECTIVE STATEMENT: Has been doing her HEP. When she did them quickly she had some dizziness. Has not had any crackling in L ear. Tripped and fell on Friday- scraped the  R elbow but otherwise no other injury.   Pt accompanied by: significant other  PERTINENT HISTORY: Patient presents for assessment since fall at home yesterday. Patient has had intermittent episodes of dizziness and lightheadedness since having COVID and also being on amlodipine. Patient denies any syncope however she did hit her head. With intermittent dizziness today she wanted to get evaluated. Patient is on aspirin. No neurologic signs or symptoms. Patient had mild injury to the right arm no pain with movement however skin abrasion and put a Band-Aid on.  PAIN:  Are you having pain? No  PRECAUTIONS: Fall  WEIGHT BEARING RESTRICTIONS: No  FALLS: Has patient fallen in last 6 months? Yes. Number of falls 6-7  LIVING ENVIRONMENT: Lives with: lives with their spouse Lives in: House/apartment Stairs: Yes: Internal: does not use steps; bilateral but cannot reach both and External: 3-4 steps; on right going up Has following equipment at home: Walker - 4 wheeled, uses rollator at night  PLOF: Independent  PATIENT GOALS: eliminate symptoms  OBJECTIVE:      TODAY'S TREATMENT: 08/21/22 Activity Comments  L sidelying test negative  R sidelying test 1-2 beats L upbeating torsional nystagmus; dizziness upon laying down and sitting up  R DH x2 Possible 1 beat nystagmus; pt noted very mild dizziness  R epley  Tolerated well; dizziness upon sitting up  R sidelying test  No dizziness   Romberg EO 30" Mild sway  Romberg EC 30" Mild sway   Standing EC + head turns/nods 30" Mil sway      [facility] PT Assessment - 08/21/22 0001       Functional Gait  Assessment   Gait assessed  Yes    Gait Level Surface Walks 20 ft in less than 7 sec but greater than 5.5 sec, uses assistive device, slower speed, mild gait deviations, or deviates 6-10 in outside of the 12 in walkway width.    Change in Gait Speed Makes only minor adjustments to walking speed, or accomplishes a change in speed with significant  gait deviations, deviates 10-15 in outside the 12 in walkway width, or changes speed but loses balance but is able to recover and continue walking.    Gait with Horizontal Head Turns Performs head turns smoothly with slight change in gait velocity (eg, minor disruption to smooth gait path), deviates 6-10 in outside 12 in walkway width, or uses an assistive device.    Gait with Vertical Head Turns Performs task with moderate change in gait velocity, slows down, deviates 10-15 in outside 12 in walkway width but recovers, can continue to walk.    Gait and Pivot Turn Pivot turns safely in greater than 3 sec and stops with no loss of balance, or pivot turns safely within 3 sec and stops with mild imbalance, requires small steps to catch balance.    Step Over Obstacle Is able to step over 2 stacked shoe boxes taped together (9 in total height) without changing gait speed. No evidence of imbalance.    Gait with Narrow Base of Support Ambulates less than 4 steps heel to toe or cannot perform without assistance.    Gait with Eyes Closed Walks 20 ft, uses assistive device, slower speed, mild gait deviations, deviates 6-10 in outside 12 in walkway width. Ambulates 20 ft in less than 9 sec but greater than 7 sec.    Ambulating Backwards Walks 20 ft, uses assistive device, slower speed, mild gait deviations, deviates 6-10 in outside 12 in walkway width.    Steps Alternating feet, must use rail.    Total Score 17             HOME EXERCISE PROGRAM Last updated: 08/21/22 Access Code: [email or web address] URL: [email or web address] Date: 08/21/2022 Prepared by: [facility]  Exercises - Brandt-Daroff Vestibular Exercise  - 1 x daily - 7 x weekly - 1-3 sets - 5 reps - Supine to Right Sidelying Vestibular Habituation  - 1 x daily - 7 x weekly - 1-3 sets - 5 reps - Narrow Stance with Counter Support  - 1 x daily - 5 x weekly - 2-3 sets - 30 sec hold - Romberg Stance with  Eyes Closed  - 1 x daily - 5 x weekly - 2-3 sets - 30 sec hold - Corner Balance Feet Apart: Eyes Closed With Head Turns  - 1 x daily - 5 x weekly - 2-3 sets - 30 sec hold - Wide Stance with Eyes Closed and Head Nods  - 1 x daily - 5 x weekly - 2-3 sets - 30 sec hold   PATIENT EDUCATION: Education details: HEP update- to be performed at counter and with husband's supervision for safety Person educated: Patient and Spouse Education method: Explanation, Demonstration, Tactile cues, Verbal cues, and Handouts Education comprehension: verbalized understanding and returned demonstration    Below measures were taken at time of initial evaluation unless otherwise specified:  DIAGNOSTIC FINDINGS: IMPRESSION: 1. No evidence of acute abnormality. 2. High posterior left scalp contusion without acute calvarial fracture.  COGNITION: Overall cognitive status: Within functional limits for tasks assessed   SENSATION: WFL  EDEMA:    MUSCLE TONE:    DTRs:    POSTURE:  No Significant postural limitations  Cervical ROM:    Active A/PROM (deg) eval  Flexion WNL  Extension WNL  Right lateral flexion WNL  Left lateral flexion WNL  Right rotation WNL  Left rotation WNL  (Blank rows = not tested)  Looking up brings on symptoms  STRENGTH:   LOWER EXTREMITY MMT:   MMT Right eval Left eval  Hip flexion    Hip abduction    Hip adduction    Hip internal rotation    Hip external rotation    Knee flexion    Knee extension    Ankle dorsiflexion    Ankle plantarflexion    Ankle inversion    Ankle eversion    (Blank rows = not tested)  BED MOBILITY:  independent  TRANSFERS: Assistive device utilized: None  Sit to stand: Complete Independence Stand to sit: Complete Independence Chair to chair: Complete Independence Floor:  DNT  RAMP:   CURB:   GAIT: Gait pattern: decreased stride length Distance walked:  Assistive device utilized: None Level of assistance:  Complete Independence Comments: ambulates with short stride/increased double limb support time, somewhat tentative in her confidence for gait  FUNCTIONAL TESTS:    PATIENT SURVEYS:    VESTIBULAR ASSESSMENT:  GENERAL OBSERVATION: wears bifocals   SYMPTOM BEHAVIOR:  Subjective history: hx of long-COVID and notes pervasive symptoms ever since  Non-Vestibular symptoms:  none  Type of dizziness: Spinning/Vertigo and Unsteady with head/body turns  Frequency: position change  Duration: 3-7 sec  Aggravating factors: Induced by position change: lying supine and supine to sit and Worse in the dark  Relieving factors: closing eyes and slow movements  Progression of symptoms: unchanged  OCULOMOTOR EXAM:  Ocular Alignment: normal  Ocular ROM: No Limitations  Spontaneous Nystagmus: absent  Gaze-Induced Nystagmus: absent  Smooth Pursuits: intact  Saccades: hypometric/undershoots  Convergence/Divergence: 4 cm     VESTIBULAR - OCULAR REFLEX:   Slow VOR: Normal  VOR Cancellation: Normal  Head-Impulse Test: HIT Right: negative HIT Left: negative  Dynamic Visual Acuity: Not able to be assessed   POSITIONAL TESTING: Right Dix-Hallpike: no nystagmus and experiences spinning Left Dix-Hallpike: no nystagmus and minimal symptoms as compared to right Right Roll Test: no nystagmus and feels spinning 3-7 sec Left Roll Test: no nystagmus and less as compared to right  When transitioning Supine to sit: around 25-35 degrees elevation she feels most dramatic spinning sensation  MOTION SENSITIVITY:  Motion Sensitivity Quotient Intensity: 0 = none, 1 = Lightheaded, 2 = Mild, 3 = Moderate, 4 = Severe, 5 = Vomiting  Intensity  1. Sitting to supine   2. Supine to L side   3. Supine to R side   4. Supine to sitting   5. L Hallpike-Dix   6. Up from L    7. R Hallpike-Dix   8. Up from R    9. Sitting, head tipped to L knee   10. Head up from L knee   11. Sitting, head tipped to R knee   12.  Head up from R knee   13. Sitting head turns x5   14.Sitting head nods x5   15. In stance, 180 turn to L    16.  In stance, 180 turn to R     OTHOSTATICS: not done  FUNCTIONAL GAIT: Functional gait assessment: NT  M-CTSIB  Condition 1: Firm Surface, EO 30 Sec, Normal Sway  Condition 2: Firm Surface, EC 30 Sec, Mild Sway  Condition 3: Foam Surface, EO 30 Sec, Mild Sway  Condition 4: Foam Surface, EC 6 Sec, Severe Sway     VESTIBULAR TREATMENT:                                                                                                   DATE:   Canalith Repositioning:  Comment: unable to fully determine to attempt Gaze Adaptation:  N/A Habituation:  Brandt-Daroff: comment: symptoms with right vs left and Repeated Rolling: number of reps: 5 Other:   PATIENT EDUCATION: Education details: assessment findings and rationale Person educated: Patient and Spouse Education method: Explanation, Demonstration, and Handouts Education comprehension: needs further education  HOME EXERCISE PROGRAM: Access Code: [email or web address] URL: [email or web address] Date: 08/18/2022 Prepared by: Sherlyn Lees  Exercises - Brandt-Daroff Vestibular Exercise  - 1 x daily - 7 x weekly - 1-3 sets - 5 reps - Supine to Right Sidelying Vestibular Habituation  - 1 x daily - 7 x weekly - 1-3 sets - 5 reps  GOALS: Goals reviewed with patient? Yes  SHORT TERM GOALS: Target date: 09/08/2022    Patient will be independent in HEP to improve functional outcomes Baseline: Goal status: IN PROGRESS  2.  Patient will be free of positional vertigo to improve quality of life and reduce unsteadiness and risk for falls Baseline: symptoms with arising from supine and right rolling Goal status: IN PROGRESS    LONG TERM GOALS: Target date: 09/29/2022    Demo improved vestibular function and safety in low light conditions per mild sway x 30 sec condition 4 M-CTSIB Baseline: 6 seconds, severe Goal  status: IN PROGRESS  2.  Manifest improved balance and low risk for falls per score 25/30 Functional Gait Assessment to improve safety with household and community mobility Baseline: NT Goal status: IN PROGRESS    ASSESSMENT:  CLINICAL IMPRESSION: Patient arrived to session with report of a fall on Friday, resulting in scratching the R elbow. Patient scored 17/30 on FGA, indicating increased risk of falls. Positional testing revealed possible R posterior canalithiasis, treated with R Epley which did ultimately improve sx with re-test. Proceeded with balance activities with mild sway evident. Patient tolerated session well and without complaints upon leaving.   OBJECTIVE IMPAIRMENTS: Abnormal gait, decreased activity tolerance, decreased balance, decreased knowledge of use of DME, difficulty walking, and dizziness.   ACTIVITY LIMITATIONS: carrying, lifting, bending, standing, stairs, bathing, reach over head, and locomotion level  PARTICIPATION LIMITATIONS: meal prep, laundry, driving, and community activity  PERSONAL FACTORS: Time since onset of injury/illness/exacerbation and 1 comorbidity: COVID, HTN  are also affecting patient's functional outcome.   REHAB POTENTIAL: Good  CLINICAL DECISION MAKING: Stable/uncomplicated  EVALUATION COMPLEXITY: Low   PLAN:  PT FREQUENCY: 1-2x/week, plan is 2x/wk x 3 wks then 1x/wk x 3 wks  PT DURATION: 6 weeks  PLANNED INTERVENTIONS: Therapeutic exercises,  Therapeutic activity, Neuromuscular re-education, Balance training, Gait training, Patient/Family education, Self Care, Joint mobilization, Stair training, Vestibular training, Canalith repositioning, DME instructions, Aquatic Therapy, Dry Needling, Electrical stimulation, Spinal mobilization, Cryotherapy, Moist heat, Taping, and Manual therapy  PLAN FOR NEXT SESSION: Re-check positional vertigo PRN, HEP review/update   Janene Harvey, PT, DPT 08/21/22 4:16 PM  [facility] [address], [place], [place] [address] Phone # [phone] Fax # [phone]

## 2022-08-21 ENCOUNTER — Encounter: Payer: Self-pay | Admitting: Physical Therapy

## 2022-08-21 ENCOUNTER — Ambulatory Visit: Payer: Medicare Other | Admitting: Physical Therapy

## 2022-08-21 DIAGNOSIS — R42 Dizziness and giddiness: Secondary | ICD-10-CM

## 2022-08-21 DIAGNOSIS — M6281 Muscle weakness (generalized): Secondary | ICD-10-CM | POA: Diagnosis not present

## 2022-08-21 DIAGNOSIS — R262 Difficulty in walking, not elsewhere classified: Secondary | ICD-10-CM

## 2022-08-21 DIAGNOSIS — R2681 Unsteadiness on feet: Secondary | ICD-10-CM | POA: Diagnosis not present

## 2022-08-23 ENCOUNTER — Ambulatory Visit: Payer: Medicare Other | Admitting: Physical Therapy

## 2022-08-23 DIAGNOSIS — R300 Dysuria: Secondary | ICD-10-CM | POA: Diagnosis not present

## 2022-08-23 DIAGNOSIS — N390 Urinary tract infection, site not specified: Secondary | ICD-10-CM | POA: Diagnosis not present

## 2022-08-25 NOTE — Therapy (Signed)
OUTPATIENT PHYSICAL THERAPY VESTIBULAR TREATMENT     Patient Name: Tracy Young MRN: [id number] DOB:01/12/1946, 76 y.o., female Today's Date: 08/25/2022  END OF SESSION:     Past Medical History:  Diagnosis Date   Chronic kidney disease, stage 3a ([place]) 06/27/2021   COVID-19    GERD without esophagitis 06/27/2021   Hypertension    Mild intermittent asthma without complication 67/34/1937   Mixed hyperlipidemia 06/27/2021   Pneumonia    Past Surgical History:  Procedure Laterality Date   TOTAL KNEE ARTHROPLASTY     Patient Active Problem List   Diagnosis Date Noted   COVID-19 virus infection 06/27/2021   Acute respiratory failure with hypoxia ([place]) 06/27/2021   Chronic kidney disease, stage 3a ([place]) 06/27/2021   GERD without esophagitis 06/27/2021   Mixed hyperlipidemia 06/27/2021   Mild intermittent asthma with (acute) exacerbation 06/27/2021   Sepsis due to COVID-19 ([place]) 06/27/2021    PCP: Maury Dus, MD REFERRING PROVIDER: Maury Dus, MD  REFERRING DIAG: R42 (ICD-10-CM) - Dizziness and giddiness  THERAPY DIAG:  No diagnosis found.  ONSET DATE: July-August 2023  Rationale for Evaluation and Treatment: Rehabilitation  SUBJECTIVE:   SUBJECTIVE STATEMENT: Has been doing her HEP. When she did them quickly she had some dizziness. Has not had any crackling in L ear. Tripped and fell on Friday- scraped the R elbow but otherwise no other injury.   Pt accompanied by: significant other  PERTINENT HISTORY: Patient presents for assessment since fall at home yesterday. Patient has had intermittent episodes of dizziness and lightheadedness since having COVID and also being on amlodipine. Patient denies any syncope however she did hit her head. With intermittent dizziness today she wanted to get evaluated. Patient is on aspirin. No neurologic signs or symptoms. Patient had mild injury to the right arm no pain with movement however skin abrasion and put a Band-Aid  on.  PAIN:  Are you having pain? No  PRECAUTIONS: Fall  WEIGHT BEARING RESTRICTIONS: No  FALLS: Has patient fallen in last 6 months? Yes. Number of falls 6-7  LIVING ENVIRONMENT: Lives with: lives with their spouse Lives in: House/apartment Stairs: Yes: Internal: does not use steps; bilateral but cannot reach both and External: 3-4 steps; on right going up Has following equipment at home: Walker - 4 wheeled, uses rollator at night  PLOF: Independent  PATIENT GOALS: eliminate symptoms  OBJECTIVE:     TODAY'S TREATMENT: 08/28/22 Activity Comments                        HOME EXERCISE PROGRAM Last updated: 08/21/22 Access Code: [email or web address] URL: [email or web address] Date: 08/21/2022 Prepared by: [facility]  Exercises - Brandt-Daroff Vestibular Exercise  - 1 x daily - 7 x weekly - 1-3 sets - 5 reps - Supine to Right Sidelying Vestibular Habituation  - 1 x daily - 7 x weekly - 1-3 sets - 5 reps - Narrow Stance with Counter Support  - 1 x daily - 5 x weekly - 2-3 sets - 30 sec hold - Romberg Stance with Eyes Closed  - 1 x daily - 5 x weekly - 2-3 sets - 30 sec hold - Corner Balance Feet Apart: Eyes Closed With Head Turns  - 1 x daily - 5 x weekly - 2-3 sets - 30 sec hold - Wide Stance with Eyes Closed and Head Nods  - 1 x daily - 5 x weekly - 2-3 sets - 30  sec hold   Below measures were taken at time of initial evaluation unless otherwise specified:   DIAGNOSTIC FINDINGS: IMPRESSION: 1. No evidence of acute abnormality. 2. High posterior left scalp contusion without acute calvarial fracture.  COGNITION: Overall cognitive status: Within functional limits for tasks assessed   SENSATION: WFL  EDEMA:    MUSCLE TONE:    DTRs:    POSTURE:  No Significant postural limitations  Cervical ROM:    Active A/PROM (deg) eval  Flexion WNL  Extension WNL  Right lateral flexion WNL  Left lateral flexion WNL   Right rotation WNL  Left rotation WNL  (Blank rows = not tested)  Looking up brings on symptoms  STRENGTH:   LOWER EXTREMITY MMT:   MMT Right eval Left eval  Hip flexion    Hip abduction    Hip adduction    Hip internal rotation    Hip external rotation    Knee flexion    Knee extension    Ankle dorsiflexion    Ankle plantarflexion    Ankle inversion    Ankle eversion    (Blank rows = not tested)  BED MOBILITY:  independent  TRANSFERS: Assistive device utilized: None  Sit to stand: Complete Independence Stand to sit: Complete Independence Chair to chair: Complete Independence Floor:  DNT  RAMP:   CURB:   GAIT: Gait pattern: decreased stride length Distance walked:  Assistive device utilized: None Level of assistance: Complete Independence Comments: ambulates with short stride/increased double limb support time, somewhat tentative in her confidence for gait  FUNCTIONAL TESTS:    PATIENT SURVEYS:    VESTIBULAR ASSESSMENT:  GENERAL OBSERVATION: wears bifocals   SYMPTOM BEHAVIOR:  Subjective history: hx of long-COVID and notes pervasive symptoms ever since  Non-Vestibular symptoms:  none  Type of dizziness: Spinning/Vertigo and Unsteady with head/body turns  Frequency: position change  Duration: 3-7 sec  Aggravating factors: Induced by position change: lying supine and supine to sit and Worse in the dark  Relieving factors: closing eyes and slow movements  Progression of symptoms: unchanged  OCULOMOTOR EXAM:  Ocular Alignment: normal  Ocular ROM: No Limitations  Spontaneous Nystagmus: absent  Gaze-Induced Nystagmus: absent  Smooth Pursuits: intact  Saccades: hypometric/undershoots  Convergence/Divergence: 4 cm     VESTIBULAR - OCULAR REFLEX:   Slow VOR: Normal  VOR Cancellation: Normal  Head-Impulse Test: HIT Right: negative HIT Left: negative  Dynamic Visual Acuity: Not able to be assessed   POSITIONAL TESTING: Right Dix-Hallpike: no  nystagmus and experiences spinning Left Dix-Hallpike: no nystagmus and minimal symptoms as compared to right Right Roll Test: no nystagmus and feels spinning 3-7 sec Left Roll Test: no nystagmus and less as compared to right  When transitioning Supine to sit: around 25-35 degrees elevation she feels most dramatic spinning sensation  MOTION SENSITIVITY:  Motion Sensitivity Quotient Intensity: 0 = none, 1 = Lightheaded, 2 = Mild, 3 = Moderate, 4 = Severe, 5 = Vomiting  Intensity  1. Sitting to supine   2. Supine to L side   3. Supine to R side   4. Supine to sitting   5. L Hallpike-Dix   6. Up from L    7. R Hallpike-Dix   8. Up from R    9. Sitting, head tipped to L knee   10. Head up from L knee   11. Sitting, head tipped to R knee   12. Head up from R knee   13. Sitting head turns x5  14.Sitting head nods x5   15. In stance, 180 turn to L    16. In stance, 180 turn to R     OTHOSTATICS: not done  FUNCTIONAL GAIT: Functional gait assessment: NT  M-CTSIB  Condition 1: Firm Surface, EO 30 Sec, Normal Sway  Condition 2: Firm Surface, EC 30 Sec, Mild Sway  Condition 3: Foam Surface, EO 30 Sec, Mild Sway  Condition 4: Foam Surface, EC 6 Sec, Severe Sway     VESTIBULAR TREATMENT:                                                                                                   DATE:   Canalith Repositioning:  Comment: unable to fully determine to attempt Gaze Adaptation:  N/A Habituation:  Brandt-Daroff: comment: symptoms with right vs left and Repeated Rolling: number of reps: 5 Other:   PATIENT EDUCATION: Education details: assessment findings and rationale Person educated: Patient and Spouse Education method: Explanation, Demonstration, and Handouts Education comprehension: needs further education  HOME EXERCISE PROGRAM: Access Code: [email or web address] URL: [email or web address] Date: 08/18/2022 Prepared by: Sherlyn Lees  Exercises - Brandt-Daroff  Vestibular Exercise  - 1 x daily - 7 x weekly - 1-3 sets - 5 reps - Supine to Right Sidelying Vestibular Habituation  - 1 x daily - 7 x weekly - 1-3 sets - 5 reps  GOALS: Goals reviewed with patient? Yes  SHORT TERM GOALS: Target date: 09/08/2022    Patient will be independent in HEP to improve functional outcomes Baseline: Goal status: IN PROGRESS  2.  Patient will be free of positional vertigo to improve quality of life and reduce unsteadiness and risk for falls Baseline: symptoms with arising from supine and right rolling Goal status: IN PROGRESS    LONG TERM GOALS: Target date: 09/29/2022    Demo improved vestibular function and safety in low light conditions per mild sway x 30 sec condition 4 M-CTSIB Baseline: 6 seconds, severe Goal status: IN PROGRESS  2.  Manifest improved balance and low risk for falls per score 25/30 Functional Gait Assessment to improve safety with household and community mobility Baseline: NT Goal status: IN PROGRESS    ASSESSMENT:  CLINICAL IMPRESSION: Patient arrived to session with report of a fall on Friday, resulting in scratching the R elbow. Patient scored 17/30 on FGA, indicating increased risk of falls. Positional testing revealed possible R posterior canalithiasis, treated with R Epley which did ultimately improve sx with re-test. Proceeded with balance activities with mild sway evident. Patient tolerated session well and without complaints upon leaving.   OBJECTIVE IMPAIRMENTS: Abnormal gait, decreased activity tolerance, decreased balance, decreased knowledge of use of DME, difficulty walking, and dizziness.   ACTIVITY LIMITATIONS: carrying, lifting, bending, standing, stairs, bathing, reach over head, and locomotion level  PARTICIPATION LIMITATIONS: meal prep, laundry, driving, and community activity  PERSONAL FACTORS: Time since onset of injury/illness/exacerbation and 1 comorbidity: COVID, HTN  are also affecting patient's  functional outcome.   REHAB POTENTIAL: Good  CLINICAL DECISION MAKING: Stable/uncomplicated  EVALUATION COMPLEXITY: Low   PLAN:  PT FREQUENCY: 1-2x/week, plan is 2x/wk  x 3 wks then 1x/wk x 3 wks  PT DURATION: 6 weeks  PLANNED INTERVENTIONS: Therapeutic exercises, Therapeutic activity, Neuromuscular re-education, Balance training, Gait training, Patient/Family education, Self Care, Joint mobilization, Stair training, Vestibular training, Canalith repositioning, DME instructions, Aquatic Therapy, Dry Needling, Electrical stimulation, Spinal mobilization, Cryotherapy, Moist heat, Taping, and Manual therapy  PLAN FOR NEXT SESSION: Re-check positional vertigo PRN, HEP review/update   Tracy Young, PT, DPT 08/25/22 11:12 AM  [facility] at [facility] [address] [place], [place] [address] Phone # [phone] Fax # [phone]

## 2022-08-28 ENCOUNTER — Ambulatory Visit: Payer: Medicare Other | Admitting: Physical Therapy

## 2022-08-28 ENCOUNTER — Encounter: Payer: Self-pay | Admitting: Physical Therapy

## 2022-08-28 DIAGNOSIS — R42 Dizziness and giddiness: Secondary | ICD-10-CM | POA: Diagnosis not present

## 2022-08-28 DIAGNOSIS — M6281 Muscle weakness (generalized): Secondary | ICD-10-CM

## 2022-08-28 DIAGNOSIS — R2681 Unsteadiness on feet: Secondary | ICD-10-CM

## 2022-08-28 DIAGNOSIS — R262 Difficulty in walking, not elsewhere classified: Secondary | ICD-10-CM

## 2022-08-28 NOTE — Progress Notes (Unsigned)
Cardiology Office Note:   Date:  08/29/2022  NAME:  Tracy Young    MRN: [id number] DOB:  [birth year]   PCP:  Maury Dus, MD  Cardiologist:  None  Electrophysiologist:  None   Referring MD: Maury Dus, MD   Chief Complaint  Patient presents with   Follow-up   History of Present Illness:   Tracy Young is a 76 y.o. female with a hx of ILD, HTN, HLD who presents for follow-up.  She reports she is doing well.  Still getting short of breath with activity.  She has been diagnosed with interstitial lung disease.  Plan for follow-up chest CT with her pulmonologist in January.  No signs of right-sided heart failure.  Echo last year was unremarkable.  I would like to recheck an echo to make sure she is not developing pulmonary hypertension.  She does have persistent tachycardia with activity.  This is all been sinus.  She has an Visual merchandiser.  No signs of atrial fibrillation.  She has coronary calcium.  Needs recheck on lipids.  She is on aspirin and statin therapy.  Denies any chest pain or pressure.  Having issues with falls.  Working with PT and vestibular therapy.  Uses a walker at night.  Overall seems to be stable.  No signs of congestive heart failure.  CV examination unremarkable other than tachycardia.  Problem List Asthma/ILD -Moderate restrictive lung disease, severe diffusion defect Coronary calcifications on Chest CT -mild LAD/RCA 3. HLD -T chol 215, HDL 69, LDL 109, triglycerides 217 4. HTN  Past Medical History: Past Medical History:  Diagnosis Date   Chronic kidney disease, stage 3a ([place]) 06/27/2021   COVID-19    GERD without esophagitis 06/27/2021   Hypertension    Mild intermittent asthma without complication 59/93/5701   Mixed hyperlipidemia 06/27/2021   Pneumonia     Past Surgical History: Past Surgical History:  Procedure Laterality Date   TOTAL KNEE ARTHROPLASTY      Current Medications: Current Meds  Medication Sig   albuterol (VENTOLIN HFA) 108  (90 Base) MCG/ACT inhaler Inhale 2 puffs into the lungs every 4 (four) hours as needed for wheezing or shortness of breath.   Ascorbic Acid (VITAMIN C) 1000 MG tablet Take 1,000 mg by mouth every morning.   aspirin EC 81 MG tablet Take 1 tablet (81 mg total) by mouth daily. Swallow whole.   buPROPion (WELLBUTRIN XL) 300 MG 24 hr tablet Take 300 mg by mouth daily.   Cholecalciferol (VITAMIN D-3) 125 MCG (5000 UT) TABS Take 5,000 Units by mouth every morning.   Cyanocobalamin (VITAMIN B-12 PO) Take 1 tablet by mouth every morning.   DULoxetine (CYMBALTA) 60 MG capsule Take 60 mg by mouth at bedtime.   famotidine (PEPCID) 40 MG tablet Take 40 mg by mouth at bedtime.   losartan (COZAAR) 25 MG tablet Take 1 tablet (25 mg total) by mouth in the morning and at bedtime.   Multiple Minerals-Vitamins (CALCIUM CITRATE-MAG-MINERALS) TABS Take 1 tablet by mouth every morning. Calcium Citrate plus magnesium and zinc   NYAMYC powder Apply 1 application topically daily. Apply to legs after showering   pantoprazole (PROTONIX) 40 MG tablet Take 40 mg by mouth every morning.   raloxifene (EVISTA) 60 MG tablet Take 60 mg by mouth daily.   rosuvastatin (CRESTOR) 20 MG tablet Take 1 tablet (20 mg total) by mouth daily.   TRELEGY ELLIPTA 200-62.5-25 MCG/ACT AEPB INHALE ONE PUFF BY MOUTH DAILY     Allergies:  Iodine stainless [iodine] and Doxycycline   Social History: Social History   Socioeconomic History   Marital status: Married    Spouse name: Not on file   Number of children: 4   Years of education: Not on file   Highest education level: Not on file  Occupational History   Occupation: Retired Press photographer  Tobacco Use   Smoking status: Never    Passive exposure: Never   Smokeless tobacco: Never  Vaping Use   Vaping Use: Never used  Substance and Sexual Activity   Alcohol use: Never   Drug use: Never   Sexual activity: Not on file  Other Topics Concern   Not on file  Social History Narrative    Not on file   Social Determinants of Health   Financial Resource Strain: Not on file  Food Insecurity: Not on file  Transportation Needs: Not on file  Physical Activity: Not on file  Stress: Not on file  Social Connections: Not on file     Family History: The patient's family history includes Cancer (age of onset: 8) in her mother; Heart attack in her father. There is no history of Heart disease or Breast cancer.  ROS:   All other ROS reviewed and negative. Pertinent positives noted in the HPI.     EKGs/Labs/Other Studies Reviewed:   The following studies were personally reviewed by me today:  TTE 09/13/2021  1. Left ventricular ejection fraction, by estimation, is 65 to 70%. The  left ventricle has normal function. The left ventricle has no regional  wall motion abnormalities. Indeterminate diastolic filling due to E-A  fusion.   2. Right ventricular systolic function is normal. The right ventricular  size is normal. There is normal pulmonary artery systolic pressure. The  estimated right ventricular systolic pressure is 86.7 mmHg.   3. The mitral valve is grossly normal. Trivial mitral valve  regurgitation. No evidence of mitral stenosis.   4. The aortic valve is tricuspid. Aortic valve regurgitation is not  visualized. No aortic stenosis is present.   5. The inferior vena cava is normal in size with greater than 50%  respiratory variability, suggesting right atrial pressure of 3 mmHg.   Recent Labs: 03/31/2022: Hemoglobin 11.5; Platelet Count 258   Recent Lipid Panel    Component Value Date/Time   CHOL 212 (H) 12/27/2021 0916   TRIG 135 12/27/2021 0916   HDL 74 12/27/2021 0916   CHOLHDL 2.9 12/27/2021 0916   LDLCALC 115 (H) 12/27/2021 0916    Physical Exam:   VS:  BP 129/77   Pulse (!) 114   Ht 5' 4.5" (1.638 m)   Wt 179 lb 3.2 oz (81.3 kg)   SpO2 95%   BMI 30.28 kg/m    Wt Readings from Last 3 Encounters:  08/29/22 179 lb 3.2 oz (81.3 kg)  04/26/22  175 lb (79.4 kg)  03/31/22 177 lb 9 oz (80.5 kg)    General: Well nourished, well developed, in no acute distress Head: Atraumatic, normal size  Eyes: PEERLA, EOMI  Neck: Supple, no JVD Endocrine: No thryomegaly Cardiac: Normal S1, S2; RRR; no murmurs, rubs, or gallops Lungs: Clear to auscultation bilaterally, no wheezing, rhonchi or rales  Abd: Soft, nontender, no hepatomegaly  Ext: No edema, pulses 2+ Musculoskeletal: No deformities, BUE and BLE strength normal and equal Skin: Warm and dry, no rashes   Neuro: Alert and oriented to person, place, time, and situation, CNII-XII grossly intact, no focal deficits  Psych: Normal mood and  affect   ASSESSMENT:   Tracy Young is a 76 y.o. female who presents for the following: 1. Coronary artery calcification seen on computed tomography   2. Mixed hyperlipidemia   3. Primary hypertension   4. Tachycardia     PLAN:   1. Coronary artery calcification seen on computed tomography 2. Mixed hyperlipidemia -Coronary calcification seen.  Started on Crestor 20 mg daily.  On aspirin.  No symptoms concerning for angina.  Recheck lipids today.  3. Primary hypertension -Blood pressure much improved on losartan 25 mg twice daily.  Continue this.  4. Tachycardia -She has had tachycardia which is sinus secondary to her interstitial lung disease.  No signs of A-fib on her Apple Watch.  I would like to recheck an echo to make sure she is not developing pulmonary hypertension.  She has a follow-up chest CT in January.  Disposition: Return in about 1 year (around 08/30/2023).  Medication Adjustments/Labs and Tests Ordered: Current medicines are reviewed at length with the patient today.  Concerns regarding medicines are outlined above.  Orders Placed This Encounter  Procedures   Lipid panel   ECHOCARDIOGRAM COMPLETE   No orders of the defined types were placed in this encounter.   Patient Instructions  Medication Instructions:  The current  medical regimen is effective;  continue present plan and medications.  *If you need a refill on your cardiac medications before your next appointment, please call your pharmacy*   Lab Work: LIPID today   If you have labs (blood work) drawn today and your tests are completely normal, you will receive your results only by: [place] (if you have MyChart) OR A paper copy in the mail If you have any lab test that is abnormal or we need to change your treatment, we will call you to review the results.   Testing/Procedures: Echocardiogram - Your physician has requested that you have an echocardiogram. Echocardiography is a painless test that uses sound waves to create images of your heart. It provides your doctor with information about the size and shape of your heart and how well your heart's chambers and valves are working. This procedure takes approximately one hour. There are no restrictions for this procedure.     Follow-Up: At [facility], you and your health needs are our priority.  As part of our continuing mission to provide you with exceptional heart care, we have created designated Provider Care Teams.  These Care Teams include your primary Cardiologist (physician) and Advanced Practice Providers (APPs -  Physician Assistants and Nurse Practitioners) who all work together to provide you with the care you need, when you need it.  We recommend signing up for the patient portal called "MyChart".  Sign up information is provided on this After Visit Summary.  MyChart is used to connect with patients for Virtual Visits (Telemedicine).  Patients are able to view lab/test results, encounter notes, upcoming appointments, etc.  Non-urgent messages can be sent to your provider as well.   To learn more about what you can do with MyChart, go to [email or web address].    Your next appointment:   12 month(s)  The format for your next appointment:   In Person  Provider:    Eleonore Chiquito, MD          Time Spent with Patient: I have spent a total of 35 minutes with patient reviewing hospital notes, telemetry, EKGs, labs and examining the patient as well as establishing an assessment and plan that  was discussed with the patient.  > 50% of time was spent in direct patient care.  Signed, Addison Naegeli. Audie Box, MD, [place]  [address], [place], [place] [address] [phone]  08/29/2022 9:21 AM

## 2022-08-29 ENCOUNTER — Ambulatory Visit: Payer: Medicare Other | Attending: Cardiovascular Disease | Admitting: Cardiovascular Disease

## 2022-08-29 ENCOUNTER — Encounter: Payer: Self-pay | Admitting: Cardiovascular Disease

## 2022-08-29 VITALS — BP 129/77 | HR 114 | Ht 64.5 in | Wt 179.2 lb

## 2022-08-29 DIAGNOSIS — I2510 Atherosclerotic heart disease of native coronary artery without angina pectoris: Secondary | ICD-10-CM | POA: Diagnosis not present

## 2022-08-29 DIAGNOSIS — R000 Tachycardia, unspecified: Secondary | ICD-10-CM | POA: Insufficient documentation

## 2022-08-29 DIAGNOSIS — I10 Essential (primary) hypertension: Secondary | ICD-10-CM | POA: Diagnosis not present

## 2022-08-29 DIAGNOSIS — E782 Mixed hyperlipidemia: Secondary | ICD-10-CM | POA: Insufficient documentation

## 2022-08-29 NOTE — Patient Instructions (Signed)
Medication Instructions:  The current medical regimen is effective;  continue present plan and medications.  *If you need a refill on your cardiac medications before your next appointment, please call your pharmacy*   Lab Work: LIPID today   If you have labs (blood work) drawn today and your tests are completely normal, you will receive your results only by: [place] (if you have MyChart) OR A paper copy in the mail If you have any lab test that is abnormal or we need to change your treatment, we will call you to review the results.   Testing/Procedures: Echocardiogram - Your physician has requested that you have an echocardiogram. Echocardiography is a painless test that uses sound waves to create images of your heart. It provides your doctor with information about the size and shape of your heart and how well your heart's chambers and valves are working. This procedure takes approximately one hour. There are no restrictions for this procedure.     Follow-Up: At [facility], you and your health needs are our priority.  As part of our continuing mission to provide you with exceptional heart care, we have created designated Provider Care Teams.  These Care Teams include your primary Cardiologist (physician) and Advanced Practice Providers (APPs -  Physician Assistants and Nurse Practitioners) who all work together to provide you with the care you need, when you need it.  We recommend signing up for the patient portal called "MyChart".  Sign up information is provided on this After Visit Summary.  MyChart is used to connect with patients for Virtual Visits (Telemedicine).  Patients are able to view lab/test results, encounter notes, upcoming appointments, etc.  Non-urgent messages can be sent to your provider as well.   To learn more about what you can do with MyChart, go to [email or web address].    Your next appointment:   12 month(s)  The format for your next  appointment:   In Person  Provider:   Eleonore Chiquito, MD

## 2022-08-29 NOTE — Therapy (Signed)
OUTPATIENT PHYSICAL THERAPY VESTIBULAR TREATMENT     Patient Name: Tracy Young MRN: [id number] DOB:[birth year], 76 y.o., female Today's Date: 08/30/2022  END OF SESSION:  PT End of Session - 08/30/22 1703     Visit Number 4    Number of Visits 12    Date for PT Re-Evaluation 09/29/22    Authorization Type Medicare A & B    Progress Note Due on Visit 10    PT Start Time 1618    PT Stop Time 1702    PT Time Calculation (min) 44 min    Equipment Utilized During Treatment Gait belt    Activity Tolerance Patient tolerated treatment well;Patient limited by fatigue   fatigue, SOB   Behavior During Therapy WFL for tasks assessed/performed                Past Medical History:  Diagnosis Date   Chronic kidney disease, stage 3a ([place]) 06/27/2021   COVID-19    GERD without esophagitis 06/27/2021   Hypertension    Mild intermittent asthma without complication 67/61/9509   Mixed hyperlipidemia 06/27/2021   Pneumonia    Past Surgical History:  Procedure Laterality Date   TOTAL KNEE ARTHROPLASTY     Patient Active Problem List   Diagnosis Date Noted   COVID-19 virus infection 06/27/2021   Acute respiratory failure with hypoxia ([place]) 06/27/2021   Chronic kidney disease, stage 3a ([place]) 06/27/2021   GERD without esophagitis 06/27/2021   Mixed hyperlipidemia 06/27/2021   Mild intermittent asthma with (acute) exacerbation 06/27/2021   Sepsis due to COVID-19 ([place]) 06/27/2021    PCP: Maury Dus, MD REFERRING PROVIDER: Maury Dus, MD  REFERRING DIAG: R42 (ICD-10-CM) - Dizziness and giddiness  THERAPY DIAG:  Dizziness and giddiness  Unsteadiness on feet  Muscle weakness (generalized)  Difficulty in walking, not elsewhere classified  ONSET DATE: July-August 2023  Rationale for Evaluation and Treatment: Rehabilitation  SUBJECTIVE:   SUBJECTIVE STATEMENT: Did the exercises yesterday- having trouble stepping over the cone. Dizziness is mostly under control  except husband reports pt felt dizzy when turning a corner quickly.   Pt accompanied by: significant other  PERTINENT HISTORY: Patient presents for assessment since fall at home yesterday. Patient has had intermittent episodes of dizziness and lightheadedness since having COVID and also being on amlodipine. Patient denies any syncope however she did hit her head. With intermittent dizziness today she wanted to get evaluated. Patient is on aspirin. No neurologic signs or symptoms. Patient had mild injury to the right arm no pain with movement however skin abrasion and put a Band-Aid on.  PAIN:  Are you having pain? No weakness in the bottom of my spine  PRECAUTIONS: Fall  WEIGHT BEARING RESTRICTIONS: No  FALLS: Has patient fallen in last 6 months? Yes. Number of falls 6-7  LIVING ENVIRONMENT: Lives with: lives with their spouse Lives in: House/apartment Stairs: Yes: Internal: does not use steps; bilateral but cannot reach both and External: 3-4 steps; on right going up Has following equipment at home: Walker - 4 wheeled, uses rollator at night  PLOF: Independent  PATIENT GOALS: eliminate symptoms  OBJECTIVE:     TODAY'S TREATMENT: 08/30/22 Activity Comments  Vitals throughout session 86-92% spO2; 199-130 bpm  backwards walking 2x35f  CGA-min A d/t imbalance; cueing to widen BOS when feeling off balance   Sidestepping 2x465f CGA-min A  Marching walk 2x4084fVery fatigued and off balance requiring min-mod A, requiring sit break  Stepping over hurdle with SPC Improved  stability with device; intermittent standing breaks to   Gait training with SPC 72f, 110 ft, 837fCueing for SPC sequencing and min A for stability; sitting rest breaks and monitoring vitals in between      HOFordast updated: 08/30/22 Access Code: [email or web address]: [email or web address] Date: 08/30/2022 Prepared by: [facility]  Notes walk 2-3 minutes in the house; monitor vitals and stop if spO2 130 bpm or if other symptoms occur. perform this 3x/day.   Exercises - Narrow Stance with Counter Support  - 1 x daily - 5 x weekly - 2-3 sets - 30 sec hold - Romberg Stance with Eyes Closed  - 1 x daily - 5 x weekly - 2-3 sets - 30 sec hold - Corner Balance Feet Apart: Eyes Closed With Head Turns  - 1 x daily - 5 x weekly - 2-3 sets - 30 sec hold - Wide Stance with Eyes Closed and Head Nods  - 1 x daily - 5 x weekly - 2-3 sets - 30 sec hold - Side Step Overs with Cones and Counter Support  - 1 x daily - 5 x weekly - 2 sets - 10 reps    PATIENT EDUCATION: Education details: educated on walking program in the house for improved endurance; edu on safe vitals and norms  Person educated: Patient and Spouse Education method: Explanation, Demonstration, Tactile cues, Verbal cues, and Handouts Education comprehension: verbalized understanding and returned demonstration     Below measures were taken at time of initial evaluation unless otherwise specified:   DIAGNOSTIC FINDINGS: IMPRESSION: 1. No evidence of acute abnormality. 2. High posterior left scalp contusion without acute calvarial fracture.  COGNITION: Overall cognitive status: Within functional limits for tasks assessed   SENSATION: WFL  EDEMA:    MUSCLE TONE:    DTRs:    POSTURE:  No Significant postural limitations  Cervical ROM:    Active A/PROM (deg) eval  Flexion WNL  Extension WNL  Right lateral flexion WNL  Left lateral flexion WNL  Right rotation WNL  Left rotation WNL  (Blank rows = not tested)  Looking up brings on symptoms  STRENGTH:   LOWER EXTREMITY MMT:   MMT Right eval Left eval  Hip flexion    Hip abduction    Hip adduction    Hip internal rotation    Hip external rotation    Knee flexion    Knee extension    Ankle dorsiflexion    Ankle plantarflexion    Ankle inversion    Ankle eversion    (Blank rows =  not tested)  BED MOBILITY:  independent  TRANSFERS: Assistive device utilized: None  Sit to stand: Complete Independence Stand to sit: Complete Independence Chair to chair: Complete Independence Floor:  DNT  RAMP:   CURB:   GAIT: Gait pattern: decreased stride length Distance walked:  Assistive device utilized: None Level of assistance: Complete Independence Comments: ambulates with short stride/increased double limb support time, somewhat tentative in her confidence for gait  FUNCTIONAL TESTS:    PATIENT SURVEYS:    VESTIBULAR ASSESSMENT:  GENERAL OBSERVATION: wears bifocals   SYMPTOM BEHAVIOR:  Subjective history: hx of long-COVID and notes pervasive symptoms ever since  Non-Vestibular symptoms:  none  Type of dizziness: Spinning/Vertigo and Unsteady with head/body turns  Frequency: position change  Duration: 3-7 sec  Aggravating factors: Induced by position change: lying supine and supine to sit and Worse in the dark  Relieving factors: closing eyes and slow movements  Progression of symptoms: unchanged  OCULOMOTOR EXAM:  Ocular Alignment: normal  Ocular ROM: No Limitations  Spontaneous Nystagmus: absent  Gaze-Induced Nystagmus: absent  Smooth Pursuits: intact  Saccades: hypometric/undershoots  Convergence/Divergence: 4 cm     VESTIBULAR - OCULAR REFLEX:   Slow VOR: Normal  VOR Cancellation: Normal  Head-Impulse Test: HIT Right: negative HIT Left: negative  Dynamic Visual Acuity: Not able to be assessed   POSITIONAL TESTING: Right Dix-Hallpike: no nystagmus and experiences spinning Left Dix-Hallpike: no nystagmus and minimal symptoms as compared to right Right Roll Test: no nystagmus and feels spinning 3-7 sec Left Roll Test: no nystagmus and less as compared to right  When transitioning Supine to sit: around 25-35 degrees elevation she feels most dramatic spinning sensation  MOTION SENSITIVITY:  Motion Sensitivity Quotient Intensity: 0 = none,  1 = Lightheaded, 2 = Mild, 3 = Moderate, 4 = Severe, 5 = Vomiting  Intensity  1. Sitting to supine   2. Supine to L side   3. Supine to R side   4. Supine to sitting   5. L Hallpike-Dix   6. Up from L    7. R Hallpike-Dix   8. Up from R    9. Sitting, head tipped to L knee   10. Head up from L knee   11. Sitting, head tipped to R knee   12. Head up from R knee   13. Sitting head turns x5   14.Sitting head nods x5   15. In stance, 180 turn to L    16. In stance, 180 turn to R     OTHOSTATICS: not done  FUNCTIONAL GAIT: Functional gait assessment: NT  M-CTSIB  Condition 1: Firm Surface, EO 30 Sec, Normal Sway  Condition 2: Firm Surface, EC 30 Sec, Mild Sway  Condition 3: Foam Surface, EO 30 Sec, Mild Sway  Condition 4: Foam Surface, EC 6 Sec, Severe Sway     VESTIBULAR TREATMENT:                                                                                                   DATE:   Canalith Repositioning:  Comment: unable to fully determine to attempt Gaze Adaptation:  N/A Habituation:  Brandt-Daroff: comment: symptoms with right vs left and Repeated Rolling: number of reps: 5 Other:   PATIENT EDUCATION: Education details: assessment findings and rationale Person educated: Patient and Spouse Education method: Explanation, Demonstration, and Handouts Education comprehension: needs further education  HOME EXERCISE PROGRAM: Access Code: [email or web address] URL: [email or web address] Date: 08/18/2022 Prepared by: Sherlyn Lees  Exercises - Brandt-Daroff Vestibular Exercise  - 1 x daily - 7 x weekly - 1-3 sets - 5 reps - Supine to Right Sidelying Vestibular Habituation  - 1 x daily - 7 x weekly - 1-3 sets - 5 reps  GOALS: Goals reviewed with patient? Yes  SHORT TERM GOALS: Target date: 09/08/2022    Patient will be independent in HEP to improve functional outcomes Baseline: Goal status: IN PROGRESS  2.  Patient will be free of positional  vertigo to  improve quality of life and reduce unsteadiness and risk for falls Baseline: symptoms with arising from supine and right rolling Goal status: IN PROGRESS    LONG TERM GOALS: Target date: 09/29/2022    Demo improved vestibular function and safety in low light conditions per mild sway x 30 sec condition 4 M-CTSIB Baseline: 6 seconds, severe Goal status: IN PROGRESS  2.  Manifest improved balance and low risk for falls per score 25/30 Functional Gait Assessment to improve safety with household and community mobility Baseline: NT Goal status: IN PROGRESS    ASSESSMENT:  CLINICAL IMPRESSION: Patient arrived to session without new complaints. Worked on dynamic balance activities with frequent intermittent rest breaks d/t fatigue, LE shaking, and SOB. Vitals were monitored throughout session, and patient was educated on pursed lip breathing and standing with wider BOS while taking rest breaks for max safety and quickness of recovery. Patient with quite limited endurance for activities, thus recommended walking program in the house while monitoring symptoms and vitals. Patient and husband agreeable and without complaints upon leaving.   OBJECTIVE IMPAIRMENTS: Abnormal gait, decreased activity tolerance, decreased balance, decreased knowledge of use of DME, difficulty walking, and dizziness.   ACTIVITY LIMITATIONS: carrying, lifting, bending, standing, stairs, bathing, reach over head, and locomotion level  PARTICIPATION LIMITATIONS: meal prep, laundry, driving, and community activity  PERSONAL FACTORS: Time since onset of injury/illness/exacerbation and 1 comorbidity: COVID, HTN  are also affecting patient's functional outcome.   REHAB POTENTIAL: Good  CLINICAL DECISION MAKING: Stable/uncomplicated  EVALUATION COMPLEXITY: Low   PLAN:  PT FREQUENCY: 1-2x/week, plan is 2x/wk x 3 wks then 1x/wk x 3 wks  PT DURATION: 6 weeks  PLANNED INTERVENTIONS: Therapeutic exercises, Therapeutic  activity, Neuromuscular re-education, Balance training, Gait training, Patient/Family education, Self Care, Joint mobilization, Stair training, Vestibular training, Canalith repositioning, DME instructions, Aquatic Therapy, Dry Needling, Electrical stimulation, Spinal mobilization, Cryotherapy, Moist heat, Taping, and Manual therapy  PLAN FOR NEXT SESSION: monitor O2 and HR with activity; gait training with SPC, 4WW; continue with balance activities   Janene Harvey, PT, DPT 08/30/22 5:09 PM  [facility] [address] [place], [place] [address] Phone # [phone] Fax # [phone]

## 2022-08-30 ENCOUNTER — Ambulatory Visit: Payer: Medicare Other | Admitting: Physical Therapy

## 2022-08-30 ENCOUNTER — Encounter: Payer: Self-pay | Admitting: Physical Therapy

## 2022-08-30 DIAGNOSIS — R7301 Impaired fasting glucose: Secondary | ICD-10-CM | POA: Diagnosis not present

## 2022-08-30 DIAGNOSIS — R262 Difficulty in walking, not elsewhere classified: Secondary | ICD-10-CM

## 2022-08-30 DIAGNOSIS — R2681 Unsteadiness on feet: Secondary | ICD-10-CM

## 2022-08-30 DIAGNOSIS — R42 Dizziness and giddiness: Secondary | ICD-10-CM | POA: Diagnosis not present

## 2022-08-30 DIAGNOSIS — M6281 Muscle weakness (generalized): Secondary | ICD-10-CM | POA: Diagnosis not present

## 2022-08-30 LAB — LIPID PANEL
Chol/HDL Ratio: 2.4 ratio (ref 0.0–4.4)
Cholesterol, Total: 148 mg/dL (ref 100–199)
HDL: 62 mg/dL (ref >39)
LDL Chol Calc (NIH): 49 mg/dL (ref 0–99)
Triglycerides: 238 mg/dL — ABNORMAL HIGH (ref 0–149)
VLDL Cholesterol Cal: 37 mg/dL (ref 5–40)

## 2022-08-31 NOTE — Therapy (Signed)
OUTPATIENT PHYSICAL THERAPY VESTIBULAR TREATMENT     Patient Name: CHERITH TEWELL MRN: [id number] DOB:10/29/1945, 76 y.o., female Today's Date: 09/04/2022  END OF SESSION:  PT End of Session - 09/04/22 1704     Visit Number 5    Number of Visits 12    Date for PT Re-Evaluation 09/29/22    Authorization Type Medicare A & B    Progress Note Due on Visit 10    PT Start Time 6160    PT Stop Time 1659    PT Time Calculation (min) 38 min    Equipment Utilized During Treatment Gait belt    Activity Tolerance Patient tolerated treatment well;Patient limited by fatigue   fatigue, SOB   Behavior During Therapy WFL for tasks assessed/performed                 Past Medical History:  Diagnosis Date   Chronic kidney disease, stage 3a ([place]) 06/27/2021   COVID-19    GERD without esophagitis 06/27/2021   Hypertension    Mild intermittent asthma without complication 73/71/0626   Mixed hyperlipidemia 06/27/2021   Pneumonia    Past Surgical History:  Procedure Laterality Date   TOTAL KNEE ARTHROPLASTY     Patient Active Problem List   Diagnosis Date Noted   COVID-19 virus infection 06/27/2021   Acute respiratory failure with hypoxia ([place]) 06/27/2021   Chronic kidney disease, stage 3a ([place]) 06/27/2021   GERD without esophagitis 06/27/2021   Mixed hyperlipidemia 06/27/2021   Mild intermittent asthma with (acute) exacerbation 06/27/2021   Sepsis due to COVID-19 ([place]) 06/27/2021    PCP: Maury Dus, MD REFERRING PROVIDER: Maury Dus, MD  REFERRING DIAG: R42 (ICD-10-CM) - Dizziness and giddiness  THERAPY DIAG:  Dizziness and giddiness  Unsteadiness on feet  Muscle weakness (generalized)  Difficulty in walking, not elsewhere classified  ONSET DATE: July-August 2023  Rationale for Evaluation and Treatment: Rehabilitation  SUBJECTIVE:   SUBJECTIVE STATEMENT: Has been walking in the house until she reaches the limits of HR/O2 as instructed last session. This  takes about 3-4 minutes. Feeling a lot stronger today.   Pt accompanied by: significant other  PERTINENT HISTORY: Patient presents for assessment since fall at home yesterday. Patient has had intermittent episodes of dizziness and lightheadedness since having COVID and also being on amlodipine. Patient denies any syncope however she did hit her head. With intermittent dizziness today she wanted to get evaluated. Patient is on aspirin. No neurologic signs or symptoms. Patient had mild injury to the right arm no pain with movement however skin abrasion and put a Band-Aid on.  PAIN:  Are you having pain? No   PRECAUTIONS: Fall  WEIGHT BEARING RESTRICTIONS: No  FALLS: Has patient fallen in last 6 months? Yes. Number of falls 6-7  LIVING ENVIRONMENT: Lives with: lives with their spouse Lives in: House/apartment Stairs: Yes: Internal: does not use steps; bilateral but cannot reach both and External: 3-4 steps; on right going up Has following equipment at home: Walker - 4 wheeled, uses rollator at night  PLOF: Independent  PATIENT GOALS: eliminate symptoms  OBJECTIVE:     TODAY'S TREATMENT: 09/04/22 Activity Comments  Vitals at start of session (using pt's pulse ox) 94% spO2, 109 bpm  gait train with SPC, stepping over hurdle, around cones  Vitals 86-94 spO2, 113-133 bpm; heavy cueing and demo for maintenance of proper SPC sequencing throughout, slow down and   Sitting/standing rest breaks with instruction on pursed lip breathing    1/2  turns to targets 2x8 Cueing for larger, confident steps   bending to place cone on floor, forward step over it 2x7 Cueing for longer step length        PATIENT EDUCATION: Education details: discussed possible need for supplemental O2 while exercising- advised to discuss this with MD; HEP update- to be performed with husband guarding  Person educated: Patient and Spouse Education method: Explanation, Demonstration, Tactile cues, Verbal cues, and  Handouts Education comprehension: verbalized understanding and returned demonstration   HOME EXERCISE PROGRAM Last updated: 09/04/22 Access Code: [facility] URL: [email or web address] Date: 09/04/2022 Prepared by: [facility]  Program Notes walk 2-3 minutes in the house; monitor vitals and stop if spO2 130 bpm or if other symptoms occur. perform this 3x/day.   Exercises - Narrow Stance with Counter Support  - 1 x daily - 5 x weekly - 2-3 sets - 30 sec hold - Romberg Stance with Eyes Closed  - 1 x daily - 5 x weekly - 2-3 sets - 30 sec hold - Corner Balance Feet Apart: Eyes Closed With Head Turns  - 1 x daily - 5 x weekly - 2-3 sets - 30 sec hold - Wide Stance with Eyes Closed and Head Nods  - 1 x daily - 5 x weekly - 2-3 sets - 30 sec hold - Side Step Overs with Cones and Counter Support  - 1 x daily - 5 x weekly - 2 sets - 10 reps - Figure-8 Walking Around Cones  - 1 x daily - 5 x weekly - 2 sets - 10 reps      Below measures were taken at time of initial evaluation unless otherwise specified:   DIAGNOSTIC FINDINGS: IMPRESSION: 1. No evidence of acute abnormality. 2. High posterior left scalp contusion without acute calvarial fracture.  COGNITION: Overall cognitive status: Within functional limits for tasks assessed   SENSATION: WFL  EDEMA:    MUSCLE TONE:    DTRs:    POSTURE:  No Significant postural limitations  Cervical ROM:    Active A/PROM (deg) eval  Flexion WNL  Extension WNL  Right lateral flexion WNL  Left lateral flexion WNL  Right rotation WNL  Left rotation WNL  (Blank rows = not tested)  Looking up brings on symptoms  STRENGTH:   LOWER EXTREMITY MMT:   MMT Right eval Left eval  Hip flexion    Hip abduction    Hip adduction    Hip internal rotation    Hip external rotation    Knee flexion    Knee extension    Ankle dorsiflexion    Ankle plantarflexion    Ankle inversion     Ankle eversion    (Blank rows = not tested)  BED MOBILITY:  independent  TRANSFERS: Assistive device utilized: None  Sit to stand: Complete Independence Stand to sit: Complete Independence Chair to chair: Complete Independence Floor:  DNT  RAMP:   CURB:   GAIT: Gait pattern: decreased stride length Distance walked:  Assistive device utilized: None Level of assistance: Complete Independence Comments: ambulates with short stride/increased double limb support time, somewhat tentative in her confidence for gait  FUNCTIONAL TESTS:    PATIENT SURVEYS:    VESTIBULAR ASSESSMENT:  GENERAL OBSERVATION: wears bifocals   SYMPTOM BEHAVIOR:  Subjective history: hx of long-COVID and notes pervasive symptoms ever since  Non-Vestibular symptoms:  none  Type of dizziness: Spinning/Vertigo and Unsteady with head/body turns  Frequency: position change  Duration:  3-7 sec  Aggravating factors: Induced by position change: lying supine and supine to sit and Worse in the dark  Relieving factors: closing eyes and slow movements  Progression of symptoms: unchanged  OCULOMOTOR EXAM:  Ocular Alignment: normal  Ocular ROM: No Limitations  Spontaneous Nystagmus: absent  Gaze-Induced Nystagmus: absent  Smooth Pursuits: intact  Saccades: hypometric/undershoots  Convergence/Divergence: 4 cm     VESTIBULAR - OCULAR REFLEX:   Slow VOR: Normal  VOR Cancellation: Normal  Head-Impulse Test: HIT Right: negative HIT Left: negative  Dynamic Visual Acuity: Not able to be assessed   POSITIONAL TESTING: Right Dix-Hallpike: no nystagmus and experiences spinning Left Dix-Hallpike: no nystagmus and minimal symptoms as compared to right Right Roll Test: no nystagmus and feels spinning 3-7 sec Left Roll Test: no nystagmus and less as compared to right  When transitioning Supine to sit: around 25-35 degrees elevation she feels most dramatic spinning sensation  MOTION SENSITIVITY:  Motion  Sensitivity Quotient Intensity: 0 = none, 1 = Lightheaded, 2 = Mild, 3 = Moderate, 4 = Severe, 5 = Vomiting  Intensity  1. Sitting to supine   2. Supine to L side   3. Supine to R side   4. Supine to sitting   5. L Hallpike-Dix   6. Up from L    7. R Hallpike-Dix   8. Up from R    9. Sitting, head tipped to L knee   10. Head up from L knee   11. Sitting, head tipped to R knee   12. Head up from R knee   13. Sitting head turns x5   14.Sitting head nods x5   15. In stance, 180 turn to L    16. In stance, 180 turn to R     OTHOSTATICS: not done  FUNCTIONAL GAIT: Functional gait assessment: NT  M-CTSIB  Condition 1: Firm Surface, EO 30 Sec, Normal Sway  Condition 2: Firm Surface, EC 30 Sec, Mild Sway  Condition 3: Foam Surface, EO 30 Sec, Mild Sway  Condition 4: Foam Surface, EC 6 Sec, Severe Sway     VESTIBULAR TREATMENT:                                                                                                   DATE:   Canalith Repositioning:  Comment: unable to fully determine to attempt Gaze Adaptation:  N/A Habituation:  Brandt-Daroff: comment: symptoms with right vs left and Repeated Rolling: number of reps: 5 Other:   PATIENT EDUCATION: Education details: assessment findings and rationale Person educated: Patient and Spouse Education method: Explanation, Demonstration, and Handouts Education comprehension: needs further education  HOME EXERCISE PROGRAM: Access Code: [email or web address] URL: [email or web address] Date: 08/18/2022 Prepared by: Sherlyn Lees  Exercises - Brandt-Daroff Vestibular Exercise  - 1 x daily - 7 x weekly - 1-3 sets - 5 reps - Supine to Right Sidelying Vestibular Habituation  - 1 x daily - 7 x weekly - 1-3 sets - 5 reps  GOALS: Goals reviewed with patient? Yes  SHORT TERM GOALS: Target date: 09/08/2022    Patient will be  independent in HEP to improve functional outcomes Baseline: Goal status: IN PROGRESS  2.   Patient will be free of positional vertigo to improve quality of life and reduce unsteadiness and risk for falls Baseline: symptoms with arising from supine and right rolling Goal status: IN PROGRESS    LONG TERM GOALS: Target date: 09/29/2022    Demo improved vestibular function and safety in low light conditions per mild sway x 30 sec condition 4 M-CTSIB Baseline: 6 seconds, severe Goal status: IN PROGRESS  2.  Manifest improved balance and low risk for falls per score 25/30 Functional Gait Assessment to improve safety with household and community mobility Baseline: NT Goal status: IN PROGRESS    ASSESSMENT:  CLINICAL IMPRESSION: Patient arrived to session with report of compliance with walking program at home. Brought in her [facility], thus worked on maintaining [place] sequencing with dynamic balance activities. Intermittent sitting rest breaks while monitoring vitals required. Patient performed standing turning and bending activities without c/o dizziness. Did demonstrate imbalance, requiring cueing to adjust footing. Patient tolerated session despite SOB with short bouts of activity.   OBJECTIVE IMPAIRMENTS: Abnormal gait, decreased activity tolerance, decreased balance, decreased knowledge of use of DME, difficulty walking, and dizziness.   ACTIVITY LIMITATIONS: carrying, lifting, bending, standing, stairs, bathing, reach over head, and locomotion level  PARTICIPATION LIMITATIONS: meal prep, laundry, driving, and community activity  PERSONAL FACTORS: Time since onset of injury/illness/exacerbation and 1 comorbidity: COVID, HTN  are also affecting patient's functional outcome.   REHAB POTENTIAL: Good  CLINICAL DECISION MAKING: Stable/uncomplicated  EVALUATION COMPLEXITY: Low   PLAN:  PT FREQUENCY: 1-2x/week, plan is 2x/wk x 3 wks then 1x/wk x 3 wks  PT DURATION: 6 weeks  PLANNED INTERVENTIONS: Therapeutic exercises, Therapeutic activity, Neuromuscular re-education, Balance  training, Gait training, Patient/Family education, Self Care, Joint mobilization, Stair training, Vestibular training, Canalith repositioning, DME instructions, Aquatic Therapy, Dry Needling, Electrical stimulation, Spinal mobilization, Cryotherapy, Moist heat, Taping, and Manual therapy  PLAN FOR NEXT SESSION: monitor O2 and HR with activity; gait training with SPC, 4WW; continue with balance activities   Janene Harvey, PT, DPT 09/04/22 5:06 PM  [place] at [facility] [address], [place], [place] [address] Phone # [phone] Fax # [phone]

## 2022-09-04 ENCOUNTER — Ambulatory Visit: Payer: Medicare Other | Admitting: Physical Therapy

## 2022-09-04 ENCOUNTER — Encounter: Payer: Self-pay | Admitting: Physical Therapy

## 2022-09-04 DIAGNOSIS — R262 Difficulty in walking, not elsewhere classified: Secondary | ICD-10-CM | POA: Diagnosis not present

## 2022-09-04 DIAGNOSIS — M6281 Muscle weakness (generalized): Secondary | ICD-10-CM | POA: Diagnosis not present

## 2022-09-04 DIAGNOSIS — R2681 Unsteadiness on feet: Secondary | ICD-10-CM | POA: Diagnosis not present

## 2022-09-04 DIAGNOSIS — R42 Dizziness and giddiness: Secondary | ICD-10-CM

## 2022-09-05 NOTE — Therapy (Signed)
OUTPATIENT PHYSICAL THERAPY VESTIBULAR TREATMENT     Patient Name: Tracy Young MRN: [id number] DOB:01/10/1946, 76 y.o., female Today's Date: 09/06/2022  END OF SESSION:  PT End of Session - 09/06/22 1617     Visit Number 6    Number of Visits 12    Date for PT Re-Evaluation 09/29/22    Authorization Type Medicare A & B    Progress Note Due on Visit 10    PT Start Time 1530    PT Stop Time 1616    PT Time Calculation (min) 46 min    Equipment Utilized During Treatment Gait belt    Activity Tolerance Patient tolerated treatment well;Patient limited by fatigue   fatigue, SOB   Behavior During Therapy WFL for tasks assessed/performed                  Past Medical History:  Diagnosis Date   Chronic kidney disease, stage 3a ([place]) 06/27/2021   COVID-19    GERD without esophagitis 06/27/2021   Hypertension    Mild intermittent asthma without complication 85/63/1497   Mixed hyperlipidemia 06/27/2021   Pneumonia    Past Surgical History:  Procedure Laterality Date   TOTAL KNEE ARTHROPLASTY     Patient Active Problem List   Diagnosis Date Noted   COVID-19 virus infection 06/27/2021   Acute respiratory failure with hypoxia ([place]) 06/27/2021   Chronic kidney disease, stage 3a ([place]) 06/27/2021   GERD without esophagitis 06/27/2021   Mixed hyperlipidemia 06/27/2021   Mild intermittent asthma with (acute) exacerbation 06/27/2021   Sepsis due to COVID-19 ([place]) 06/27/2021    PCP: Maury Dus, MD REFERRING PROVIDER: Maury Dus, MD  REFERRING DIAG: R42 (ICD-10-CM) - Dizziness and giddiness  THERAPY DIAG:  Dizziness and giddiness  Unsteadiness on feet  Muscle weakness (generalized)  Difficulty in walking, not elsewhere classified  ONSET DATE: July-August 2023  Rationale for Evaluation and Treatment: Rehabilitation  SUBJECTIVE:   SUBJECTIVE STATEMENT: I worked hard all day yesterday. Still fatiguing after 3-4 minutes.   Pt accompanied by:  significant other  PERTINENT HISTORY: Patient presents for assessment since fall at home yesterday. Patient has had intermittent episodes of dizziness and lightheadedness since having COVID and also being on amlodipine. Patient denies any syncope however she did hit her head. With intermittent dizziness today she wanted to get evaluated. Patient is on aspirin. No neurologic signs or symptoms. Patient had mild injury to the right arm no pain with movement however skin abrasion and put a Band-Aid on.  PAIN:  Are you having pain? No   PRECAUTIONS: Fall  WEIGHT BEARING RESTRICTIONS: No  FALLS: Has patient fallen in last 6 months? Yes. Number of falls 6-7  LIVING ENVIRONMENT: Lives with: lives with their spouse Lives in: House/apartment Stairs: Yes: Internal: does not use steps; bilateral but cannot reach both and External: 3-4 steps; on right going up Has following equipment at home: Walker - 4 wheeled, uses rollator at night  PLOF: Independent  PATIENT GOALS: eliminate symptoms  OBJECTIVE:     TODAY'S TREATMENT: 09/06/22 Activity Comments  Vitals at start of session  96% O2 sat, 105 bpm  gait training with 4WW Tolerated 4 minutes of continuous walking with 4WW; cues to avoid pushing walker too far forward and maintain heel-toe pattern  87% spO2, 125 bpm; normalized with rest break  Practice with transfer safety and use of 4WW brakes    wall bumps shoulder and hip strategy EO/EC Increased demo and verbal cueing to promote proper  sequencing; 91% 130 bpm, normalized with rest break  standing on foam + head turns/nods 2x30" each Les shaking; 1 posterior LOB with max A to recover  bending to place cone on floor, forward step over it 2x7 No dizziness; 85% and 134 bpm; attempted to take rest break in standing but HR did not drop until patient sat down; short step length and cues for wider BOS     HOME EXERCISE PROGRAM Last updated: 09/04/22 Access Code: [facility] URL:  [email or web address] Date: 09/04/2022 Prepared by: [facility]  Program Notes walk 2-3 minutes in the house; monitor vitals and stop if spO2 130 bpm or if other symptoms occur. perform this 3x/day.   Exercises - Narrow Stance with Counter Support  - 1 x daily - 5 x weekly - 2-3 sets - 30 sec hold - Romberg Stance with Eyes Closed  - 1 x daily - 5 x weekly - 2-3 sets - 30 sec hold - Corner Balance Feet Apart: Eyes Closed With Head Turns  - 1 x daily - 5 x weekly - 2-3 sets - 30 sec hold - Wide Stance with Eyes Closed and Head Nods  - 1 x daily - 5 x weekly - 2-3 sets - 30 sec hold - Side Step Overs with Cones and Counter Support  - 1 x daily - 5 x weekly - 2 sets - 10 reps - Figure-8 Walking Around Cones  - 1 x daily - 5 x weekly - 2 sets - 10 reps   PATIENT EDUCATION: Education details: review and discussion on current HEP; encouraged use of 4WW when walking outside for fitness with husband; discussion on remainder of POC and remaining appts Person educated: Patient and Spouse Education method: Explanation, Demonstration, Tactile cues, Verbal cues, and Handouts Education comprehension: verbalized understanding and returned demonstration    Below measures were taken at time of initial evaluation unless otherwise specified:   DIAGNOSTIC FINDINGS: IMPRESSION: 1. No evidence of acute abnormality. 2. High posterior left scalp contusion without acute calvarial fracture.  COGNITION: Overall cognitive status: Within functional limits for tasks assessed   SENSATION: WFL  EDEMA:    MUSCLE TONE:    DTRs:    POSTURE:  No Significant postural limitations  Cervical ROM:    Active A/PROM (deg) eval  Flexion WNL  Extension WNL  Right lateral flexion WNL  Left lateral flexion WNL  Right rotation WNL  Left rotation WNL  (Blank rows = not tested)  Looking up brings on symptoms  STRENGTH:   LOWER EXTREMITY MMT:   MMT  Right eval Left eval  Hip flexion    Hip abduction    Hip adduction    Hip internal rotation    Hip external rotation    Knee flexion    Knee extension    Ankle dorsiflexion    Ankle plantarflexion    Ankle inversion    Ankle eversion    (Blank rows = not tested)  BED MOBILITY:  independent  TRANSFERS: Assistive device utilized: None  Sit to stand: Complete Independence Stand to sit: Complete Independence Chair to chair: Complete Independence Floor:  DNT  RAMP:   CURB:   GAIT: Gait pattern: decreased stride length Distance walked:  Assistive device utilized: None Level of assistance: Complete Independence Comments: ambulates with short stride/increased double limb support time, somewhat tentative in her confidence for gait  FUNCTIONAL TESTS:    PATIENT SURVEYS:    VESTIBULAR ASSESSMENT:  GENERAL OBSERVATION:  wears bifocals   SYMPTOM BEHAVIOR:  Subjective history: hx of long-COVID and notes pervasive symptoms ever since  Non-Vestibular symptoms:  none  Type of dizziness: Spinning/Vertigo and Unsteady with head/body turns  Frequency: position change  Duration: 3-7 sec  Aggravating factors: Induced by position change: lying supine and supine to sit and Worse in the dark  Relieving factors: closing eyes and slow movements  Progression of symptoms: unchanged  OCULOMOTOR EXAM:  Ocular Alignment: normal  Ocular ROM: No Limitations  Spontaneous Nystagmus: absent  Gaze-Induced Nystagmus: absent  Smooth Pursuits: intact  Saccades: hypometric/undershoots  Convergence/Divergence: 4 cm     VESTIBULAR - OCULAR REFLEX:   Slow VOR: Normal  VOR Cancellation: Normal  Head-Impulse Test: HIT Right: negative HIT Left: negative  Dynamic Visual Acuity: Not able to be assessed   POSITIONAL TESTING: Right Dix-Hallpike: no nystagmus and experiences spinning Left Dix-Hallpike: no nystagmus and minimal symptoms as compared to right Right Roll Test: no nystagmus and  feels spinning 3-7 sec Left Roll Test: no nystagmus and less as compared to right  When transitioning Supine to sit: around 25-35 degrees elevation she feels most dramatic spinning sensation  MOTION SENSITIVITY:  Motion Sensitivity Quotient Intensity: 0 = none, 1 = Lightheaded, 2 = Mild, 3 = Moderate, 4 = Severe, 5 = Vomiting  Intensity  1. Sitting to supine   2. Supine to L side   3. Supine to R side   4. Supine to sitting   5. L Hallpike-Dix   6. Up from L    7. R Hallpike-Dix   8. Up from R    9. Sitting, head tipped to L knee   10. Head up from L knee   11. Sitting, head tipped to R knee   12. Head up from R knee   13. Sitting head turns x5   14.Sitting head nods x5   15. In stance, 180 turn to L    16. In stance, 180 turn to R     OTHOSTATICS: not done  FUNCTIONAL GAIT: Functional gait assessment: NT  M-CTSIB  Condition 1: Firm Surface, EO 30 Sec, Normal Sway  Condition 2: Firm Surface, EC 30 Sec, Mild Sway  Condition 3: Foam Surface, EO 30 Sec, Mild Sway  Condition 4: Foam Surface, EC 6 Sec, Severe Sway     VESTIBULAR TREATMENT:                                                                                                   DATE:   Canalith Repositioning:  Comment: unable to fully determine to attempt Gaze Adaptation:  N/A Habituation:  Brandt-Daroff: comment: symptoms with right vs left and Repeated Rolling: number of reps: 5 Other:   PATIENT EDUCATION: Education details: assessment findings and rationale Person educated: Patient and Spouse Education method: Explanation, Demonstration, and Handouts Education comprehension: needs further education  HOME EXERCISE PROGRAM: Access Code: [email or web address] URL: [email or web address] Date: 08/18/2022 Prepared by: Sherlyn Lees  Exercises - Brandt-Daroff Vestibular Exercise  - 1 x daily - 7 x weekly - 1-3 sets - 5 reps - Supine to  Right Sidelying Vestibular Habituation  - 1 x daily - 7 x weekly -  1-3 sets - 5 reps  GOALS: Goals reviewed with patient? Yes  SHORT TERM GOALS: Target date: 09/08/2022    Patient will be independent in HEP to improve functional outcomes Baseline: Goal status: IN PROGRESS  2.  Patient will be free of positional vertigo to improve quality of life and reduce unsteadiness and risk for falls Baseline: symptoms with arising from supine and right rolling Goal status: IN PROGRESS    LONG TERM GOALS: Target date: 09/29/2022    Demo improved vestibular function and safety in low light conditions per mild sway x 30 sec condition 4 M-CTSIB Baseline: 6 seconds, severe Goal status: IN PROGRESS  2.  Manifest improved balance and low risk for falls per score 25/30 Functional Gait Assessment to improve safety with household and community mobility Baseline: NT Goal status: IN PROGRESS    ASSESSMENT:  CLINICAL IMPRESSION: Patient arrived to session without new complaints. Worked on Personnel officer with [phone] and educated on safe use of walker with transfers and use of brakes. Patient was able to demonstrate safe use of 4WW today. Patient performed multisensory balance activities with cueing for max control and sequencing. Able to progress standing activities with head turns and nods; 1 episode of LOB with max A required to recover. Patient still with dyspnea and change in vitals with exertion- most markedly with bending activities today. Allowed symptoms and vitals to normalize before proceeding. No complaints at end of session.   OBJECTIVE IMPAIRMENTS: Abnormal gait, decreased activity tolerance, decreased balance, decreased knowledge of use of DME, difficulty walking, and dizziness.   ACTIVITY LIMITATIONS: carrying, lifting, bending, standing, stairs, bathing, reach over head, and locomotion level  PARTICIPATION LIMITATIONS: meal prep, laundry, driving, and community activity  PERSONAL FACTORS: Time since onset of injury/illness/exacerbation and 1 comorbidity:  COVID, HTN  are also affecting patient's functional outcome.   REHAB POTENTIAL: Good  CLINICAL DECISION MAKING: Stable/uncomplicated  EVALUATION COMPLEXITY: Low   PLAN:  PT FREQUENCY: 1-2x/week, plan is 2x/wk x 3 wks then 1x/wk x 3 wks  PT DURATION: 6 weeks  PLANNED INTERVENTIONS: Therapeutic exercises, Therapeutic activity, Neuromuscular re-education, Balance training, Gait training, Patient/Family education, Self Care, Joint mobilization, Stair training, Vestibular training, Canalith repositioning, DME instructions, Aquatic Therapy, Dry Needling, Electrical stimulation, Spinal mobilization, Cryotherapy, Moist heat, Taping, and Manual therapy  PLAN FOR NEXT SESSION: monitor O2 and HR with activity; gait training with SPC, 4WW; continue with balance activities   Janene Harvey, PT, DPT 09/06/22 4:20 PM  [facility] at [facility] [address] [place], [place] [address] Phone # [phone] Fax # [phone]

## 2022-09-06 ENCOUNTER — Encounter: Payer: Self-pay | Admitting: Physical Therapy

## 2022-09-06 ENCOUNTER — Ambulatory Visit: Payer: Medicare Other | Admitting: Physical Therapy

## 2022-09-06 DIAGNOSIS — R2681 Unsteadiness on feet: Secondary | ICD-10-CM

## 2022-09-06 DIAGNOSIS — R262 Difficulty in walking, not elsewhere classified: Secondary | ICD-10-CM

## 2022-09-06 DIAGNOSIS — M6281 Muscle weakness (generalized): Secondary | ICD-10-CM | POA: Diagnosis not present

## 2022-09-06 DIAGNOSIS — R42 Dizziness and giddiness: Secondary | ICD-10-CM

## 2022-09-12 NOTE — Therapy (Signed)
OUTPATIENT PHYSICAL THERAPY VESTIBULAR TREATMENT     Patient Name: Tracy Young MRN: [id number] DOB:1946-05-11, 76 y.o., female Today's Date: 09/13/2022  END OF SESSION:  PT End of Session - 09/13/22 1614     Visit Number 7    Number of Visits 12    Date for PT Re-Evaluation 09/29/22    Authorization Type Medicare A & B    Authorization Time Period KX    Progress Note Due on Visit 10    PT Start Time 1532    PT Stop Time 7893    PT Time Calculation (min) 42 min    Equipment Utilized During Treatment Gait belt    Activity Tolerance Patient tolerated treatment well;Patient limited by fatigue   fatigue, SOB   Behavior During Therapy WFL for tasks assessed/performed                   Past Medical History:  Diagnosis Date   Chronic kidney disease, stage 3a ([place]) 06/27/2021   COVID-19    GERD without esophagitis 06/27/2021   Hypertension    Mild intermittent asthma without complication 81/09/7508   Mixed hyperlipidemia 06/27/2021   Pneumonia    Past Surgical History:  Procedure Laterality Date   TOTAL KNEE ARTHROPLASTY     Patient Active Problem List   Diagnosis Date Noted   COVID-19 virus infection 06/27/2021   Acute respiratory failure with hypoxia ([place]) 06/27/2021   Chronic kidney disease, stage 3a ([place]) 06/27/2021   GERD without esophagitis 06/27/2021   Mixed hyperlipidemia 06/27/2021   Mild intermittent asthma with (acute) exacerbation 06/27/2021   Sepsis due to COVID-19 ([place]) 06/27/2021    PCP: Maury Dus, MD REFERRING PROVIDER: Maury Dus, MD  REFERRING DIAG: R42 (ICD-10-CM) - Dizziness and giddiness  THERAPY DIAG:  Dizziness and giddiness  Unsteadiness on feet  Muscle weakness (generalized)  Difficulty in walking, not elsewhere classified  ONSET DATE: July-August 2023  Rationale for Evaluation and Treatment: Rehabilitation  SUBJECTIVE:   SUBJECTIVE STATEMENT: Had a good holiday.   Pt accompanied by: significant  other  PERTINENT HISTORY: Patient presents for assessment since fall at home yesterday. Patient has had intermittent episodes of dizziness and lightheadedness since having COVID and also being on amlodipine. Patient denies any syncope however she did hit her head. With intermittent dizziness today she wanted to get evaluated. Patient is on aspirin. No neurologic signs or symptoms. Patient had mild injury to the right arm no pain with movement however skin abrasion and put a Band-Aid on.  PAIN:  Are you having pain? No   PRECAUTIONS: Fall  WEIGHT BEARING RESTRICTIONS: No  FALLS: Has patient fallen in last 6 months? Yes. Number of falls 6-7  LIVING ENVIRONMENT: Lives with: lives with their spouse Lives in: House/apartment Stairs: Yes: Internal: does not use steps; bilateral but cannot reach both and External: 3-4 steps; on right going up Has following equipment at home: Walker - 4 wheeled, uses rollator at night  PLOF: Independent  PATIENT GOALS: eliminate symptoms  OBJECTIVE:      TODAY'S TREATMENT: 09/13/22 Activity Comments  Vitals at start of sessopm 88-93 112 bpm  Nustep L1 x 1 min Ues/Les x5 rounds  Cueing for moderate pace; checked vitals after every 1-2 minutes and cued on pursed lip breathing until HR< 115 bpm and spO2 >90%  wall bumps EO/EC on foam  Required detailed review and demo for carryover; consistent cueing for pacing and to maintain balance and standing first before proceeding   standing  on foam + head turns/nods to targets 2x30" each CGA-min A; more imbalance evident with head nods, requiring assist and verbal cueing to correct  89% spO2, 118 bpm upon stopping; improved with pursed lip breathing  bending to place cone on overhead mirror from low mat 2x8 each side Report of 2/10 wooziness to R;   91% spO2, 127 bpm upon stopping; improved with pursed lip breathing  backwards walking 2x56f CGA; good stability   walking with EC [id number]; imbalance and  hesitation            HOME EXERCISE PROGRAM Last updated: 09/04/22 Access [facility]: [email or web address]: [email or web address] Date: 09/04/2022 Prepared by: MCSentinel ClinicProgram Notes walk 2-3 minutes in the house; monitor vitals and stop if spO2 130 bpm or if other symptoms occur. perform this 3x/day.   Exercises - Narrow Stance with Counter Support  - 1 x daily - 5 x weekly - 2-3 sets - 30 sec hold - Romberg Stance with Eyes Closed  - 1 x daily - 5 x weekly - 2-3 sets - 30 sec hold - Corner Balance Feet Apart: Eyes Closed With Head Turns  - 1 x daily - 5 x weekly - 2-3 sets - 30 sec hold - Wide Stance with Eyes Closed and Head Nods  - 1 x daily - 5 x weekly - 2-3 sets - 30 sec hold - Side Step Overs with Cones and Counter Support  - 1 x daily - 5 x weekly - 2 sets - 10 reps - Figure-8 Walking Around Cones  - 1 x daily - 5 x weekly - 2 sets - 10 reps     Below measures were taken at time of initial evaluation unless otherwise specified:   DIAGNOSTIC FINDINGS: IMPRESSION: 1. No evidence of acute abnormality. 2. High posterior left scalp contusion without acute calvarial fracture.  COGNITION: Overall cognitive status: Within functional limits for tasks assessed   SENSATION: WFL  EDEMA:    MUSCLE TONE:    DTRs:    POSTURE:  No Significant postural limitations  Cervical ROM:    Active A/PROM (deg) eval  Flexion WNL  Extension WNL  Right lateral flexion WNL  Left lateral flexion WNL  Right rotation WNL  Left rotation WNL  (Blank rows = not tested)  Looking up brings on symptoms  STRENGTH:   LOWER EXTREMITY MMT:   MMT Right eval Left eval  Hip flexion    Hip abduction    Hip adduction    Hip internal rotation    Hip external rotation    Knee flexion    Knee extension    Ankle dorsiflexion    Ankle plantarflexion    Ankle inversion    Ankle eversion    (Blank rows = not tested)  BED MOBILITY:   independent  TRANSFERS: Assistive device utilized: None  Sit to stand: Complete Independence Stand to sit: Complete Independence Chair to chair: Complete Independence Floor:  DNT  RAMP:   CURB:   GAIT: Gait pattern: decreased stride length Distance walked:  Assistive device utilized: None Level of assistance: Complete Independence Comments: ambulates with short stride/increased double limb support time, somewhat tentative in her confidence for gait  FUNCTIONAL TESTS:    PATIENT SURVEYS:    VESTIBULAR ASSESSMENT:  GENERAL OBSERVATION: wears bifocals   SYMPTOM BEHAVIOR:  Subjective history: hx of long-COVID and notes pervasive symptoms ever since  Non-Vestibular symptoms:  none  Type of dizziness: Spinning/Vertigo  and Unsteady with head/body turns  Frequency: position change  Duration: 3-7 sec  Aggravating factors: Induced by position change: lying supine and supine to sit and Worse in the dark  Relieving factors: closing eyes and slow movements  Progression of symptoms: unchanged  OCULOMOTOR EXAM:  Ocular Alignment: normal  Ocular ROM: No Limitations  Spontaneous Nystagmus: absent  Gaze-Induced Nystagmus: absent  Smooth Pursuits: intact  Saccades: hypometric/undershoots  Convergence/Divergence: 4 cm     VESTIBULAR - OCULAR REFLEX:   Slow VOR: Normal  VOR Cancellation: Normal  Head-Impulse Test: HIT Right: negative HIT Left: negative  Dynamic Visual Acuity: Not able to be assessed   POSITIONAL TESTING: Right Dix-Hallpike: no nystagmus and experiences spinning Left Dix-Hallpike: no nystagmus and minimal symptoms as compared to right Right Roll Test: no nystagmus and feels spinning 3-7 sec Left Roll Test: no nystagmus and less as compared to right  When transitioning Supine to sit: around 25-35 degrees elevation she feels most dramatic spinning sensation  MOTION SENSITIVITY:  Motion Sensitivity Quotient Intensity: 0 = none, 1 = Lightheaded, 2 = Mild, 3  = Moderate, 4 = Severe, 5 = Vomiting  Intensity  1. Sitting to supine   2. Supine to L side   3. Supine to R side   4. Supine to sitting   5. L Hallpike-Dix   6. Up from L    7. R Hallpike-Dix   8. Up from R    9. Sitting, head tipped to L knee   10. Head up from L knee   11. Sitting, head tipped to R knee   12. Head up from R knee   13. Sitting head turns x5   14.Sitting head nods x5   15. In stance, 180 turn to L    16. In stance, 180 turn to R     OTHOSTATICS: not done  FUNCTIONAL GAIT: Functional gait assessment: NT  M-CTSIB  Condition 1: Firm Surface, EO 30 Sec, Normal Sway  Condition 2: Firm Surface, EC 30 Sec, Mild Sway  Condition 3: Foam Surface, EO 30 Sec, Mild Sway  Condition 4: Foam Surface, EC 6 Sec, Severe Sway     VESTIBULAR TREATMENT:                                                                                                   DATE:   Canalith Repositioning:  Comment: unable to fully determine to attempt Gaze Adaptation:  N/A Habituation:  Brandt-Daroff: comment: symptoms with right vs left and Repeated Rolling: number of reps: 5 Other:   PATIENT EDUCATION: Education details: assessment findings and rationale Person educated: Patient and Spouse Education method: Explanation, Demonstration, and Handouts Education comprehension: needs further education  HOME EXERCISE PROGRAM: Access Code: [email or web address] URL: [email or web address] Date: 08/18/2022 Prepared by: Sherlyn Lees  Exercises - Brandt-Daroff Vestibular Exercise  - 1 x daily - 7 x weekly - 1-3 sets - 5 reps - Supine to Right Sidelying Vestibular Habituation  - 1 x daily - 7 x weekly - 1-3 sets - 5 reps  GOALS: Goals reviewed with patient? Yes  SHORT  TERM GOALS: Target date: 09/08/2022    Patient will be independent in HEP to improve functional outcomes Baseline: Goal status: IN PROGRESS  2.  Patient will be free of positional vertigo to improve quality of life and  reduce unsteadiness and risk for falls Baseline: symptoms with arising from supine and right rolling Goal status: IN PROGRESS    LONG TERM GOALS: Target date: 09/29/2022    Demo improved vestibular function and safety in low light conditions per mild sway x 30 sec condition 4 M-CTSIB Baseline: 6 seconds, severe Goal status: IN PROGRESS  2.  Manifest improved balance and low risk for falls per score 25/30 Functional Gait Assessment to improve safety with household and community mobility Baseline: NT Goal status: IN PROGRESS    ASSESSMENT:  CLINICAL IMPRESSION: Patient arrived to session without new complaints. Worked on addressing endurance with NuStep machine while checking vitals consistently throughout; patient tolerated well. Progressed multisensory balance activities with patient demonstrating increased sway on foam with EC. Also with increased imbalance evident with head nod activities. Patient with slightly improved endurance evident with today's activities compared to previous sessions; still monitored vitals and allowed values to normalize continuing.  No complaints at end of session.   OBJECTIVE IMPAIRMENTS: Abnormal gait, decreased activity tolerance, decreased balance, decreased knowledge of use of DME, difficulty walking, and dizziness.   ACTIVITY LIMITATIONS: carrying, lifting, bending, standing, stairs, bathing, reach over head, and locomotion level  PARTICIPATION LIMITATIONS: meal prep, laundry, driving, and community activity  PERSONAL FACTORS: Time since onset of injury/illness/exacerbation and 1 comorbidity: COVID, HTN  are also affecting patient's functional outcome.   REHAB POTENTIAL: Good  CLINICAL DECISION MAKING: Stable/uncomplicated  EVALUATION COMPLEXITY: Low   PLAN:  PT FREQUENCY: 1-2x/week, plan is 2x/wk x 3 wks then 1x/wk x 3 wks  PT DURATION: 6 weeks  PLANNED INTERVENTIONS: Therapeutic exercises, Therapeutic activity, Neuromuscular  re-education, Balance training, Gait training, Patient/Family education, Self Care, Joint mobilization, Stair training, Vestibular training, Canalith repositioning, DME instructions, Aquatic Therapy, Dry Needling, Electrical stimulation, Spinal mobilization, Cryotherapy, Moist heat, Taping, and Manual therapy  PLAN FOR NEXT SESSION: monitor O2 and HR with activity; gait training with SPC, 4WW; continue with balance activities   Janene Harvey, PT, DPT 09/13/22 4:17 PM  [facility] [address], [place], [place] [address] Phone # [phone] Fax # [phone]

## 2022-09-13 ENCOUNTER — Ambulatory Visit: Payer: Medicare Other | Admitting: Physical Therapy

## 2022-09-13 ENCOUNTER — Encounter: Payer: Self-pay | Admitting: Physical Therapy

## 2022-09-13 DIAGNOSIS — R2681 Unsteadiness on feet: Secondary | ICD-10-CM

## 2022-09-13 DIAGNOSIS — M6281 Muscle weakness (generalized): Secondary | ICD-10-CM

## 2022-09-13 DIAGNOSIS — R262 Difficulty in walking, not elsewhere classified: Secondary | ICD-10-CM | POA: Diagnosis not present

## 2022-09-13 DIAGNOSIS — R42 Dizziness and giddiness: Secondary | ICD-10-CM | POA: Diagnosis not present

## 2022-09-19 NOTE — Therapy (Signed)
OUTPATIENT PHYSICAL THERAPY VESTIBULAR TREATMENT     Patient Name: Tracy Young MRN: [id number] DOB:Jul 05, 1946, 77 y.o., female Today's Date: 09/20/2022  END OF SESSION:  PT End of Session - 09/20/22 1611     Visit Number 8    Number of Visits 12    Date for PT Re-Evaluation 09/29/22    Authorization Type Medicare A & B    Authorization Time Period KX    Progress Note Due on Visit 10    PT Start Time 1521    PT Stop Time 1602    PT Time Calculation (min) 41 min    Equipment Utilized During Treatment Gait belt    Activity Tolerance Patient tolerated treatment well;Patient limited by fatigue   fatigue, SOB   Behavior During Therapy WFL for tasks assessed/performed                    Past Medical History:  Diagnosis Date   Chronic kidney disease, stage 3a ([place]) 06/27/2021   COVID-19    GERD without esophagitis 06/27/2021   Hypertension    Mild intermittent asthma without complication 51/76/1607   Mixed hyperlipidemia 06/27/2021   Pneumonia    Past Surgical History:  Procedure Laterality Date   TOTAL KNEE ARTHROPLASTY     Patient Active Problem List   Diagnosis Date Noted   COVID-19 virus infection 06/27/2021   Acute respiratory failure with hypoxia ([place]) 06/27/2021   Chronic kidney disease, stage 3a ([place]) 06/27/2021   GERD without esophagitis 06/27/2021   Mixed hyperlipidemia 06/27/2021   Mild intermittent asthma with (acute) exacerbation 06/27/2021   Sepsis due to COVID-19 ([place]) 06/27/2021    PCP: Maury Dus, MD REFERRING PROVIDER: Maury Dus, MD  REFERRING DIAG: R42 (ICD-10-CM) - Dizziness and giddiness  THERAPY DIAG:  Dizziness and giddiness  Unsteadiness on feet  Muscle weakness (generalized)  Difficulty in walking, not elsewhere classified  ONSET DATE: July-August 2023  Rationale for Evaluation and Treatment: Rehabilitation  SUBJECTIVE:   SUBJECTIVE STATEMENT: Had some bloodwork that suggested pre-DM.   Pt accompanied by:  significant other  PERTINENT HISTORY: Patient presents for assessment since fall at home yesterday. Patient has had intermittent episodes of dizziness and lightheadedness since having COVID and also being on amlodipine. Patient denies any syncope however she did hit her head. With intermittent dizziness today she wanted to get evaluated. Patient is on aspirin. No neurologic signs or symptoms. Patient had mild injury to the right arm no pain with movement however skin abrasion and put a Band-Aid on.  PAIN:  Are you having pain? No   PRECAUTIONS: Fall  WEIGHT BEARING RESTRICTIONS: No  FALLS: Has patient fallen in last 6 months? Yes. Number of falls 6-7  LIVING ENVIRONMENT: Lives with: lives with their spouse Lives in: House/apartment Stairs: Yes: Internal: does not use steps; bilateral but cannot reach both and External: 3-4 steps; on right going up Has following equipment at home: Walker - 4 wheeled, uses rollator at night  PLOF: Independent  PATIENT GOALS: eliminate symptoms  OBJECTIVE:     TODAY'S TREATMENT: 09/20/22 Activity Comments  Vitals at start of session 93% spO2, 102 bpm   Treadmill walking with B UE support on rails 2x 2 min at 0.8-1.1 mph 82% spO2, 122 bpm; O2 improved to 94% with pursed lip breathing; cueing to avoid shuffling and instead demo longer step length with heel-toe pattern  alt toe tap on cone 3x30"  Sitting rest breaks; CGA-min A d/t imbalance and cues to shift  onto standing LE; edu on pursed lip breathing d/t SOB   D2 flexion to cone on floor 5x each side  Limited by fatigue; good stability; no dizziness   figure 8 turns 3 sets of 2-3 reps  CGA; cues for wider BOS and increased confidence with stepping; occasional staggering and scissoring with stepping    PATIENT EDUCATION: Education details: HEP update- to be performed at counter and husband's direct supervision for safety; discussion on patient's tolerance for activities at home and remaining changes  in vitals with activity Person educated: Patient and Spouse Education method: Explanation, Demonstration, Tactile cues, Verbal cues, and Handouts Education comprehension: verbalized understanding and returned demonstration   HOME EXERCISE PROGRAM Last updated: 09/21/22 Access Code: [facility] URL: [email or web address] Date: 09/20/2022 Prepared by: [facility]  Program Notes walk 2-3 minutes in the house; monitor vitals and stop if spO2 130 bpm or if other symptoms occur. perform this 3x/day.   Exercises - Narrow Stance with Counter Support  - 1 x daily - 5 x weekly - 2-3 sets - 30 sec hold - Romberg Stance with Eyes Closed  - 1 x daily - 5 x weekly - 2-3 sets - 30 sec hold - Corner Balance Feet Apart: Eyes Closed With Head Turns  - 1 x daily - 5 x weekly - 2-3 sets - 30 sec hold - Wide Stance with Eyes Closed and Head Nods  - 1 x daily - 5 x weekly - 2-3 sets - 30 sec hold - Side Step Overs with Cones and Counter Support  - 1 x daily - 5 x weekly - 2 sets - 10 reps - Figure-8 Walking Around Cones  - 1 x daily - 5 x weekly - 2 sets - 10 reps - Standing Toe Taps  - 1 x daily - 5 x weekly - 3 sets - 30 sec hold     Below measures were taken at time of initial evaluation unless otherwise specified:   DIAGNOSTIC FINDINGS: IMPRESSION: 1. No evidence of acute abnormality. 2. High posterior left scalp contusion without acute calvarial fracture.  COGNITION: Overall cognitive status: Within functional limits for tasks assessed   SENSATION: WFL  EDEMA:    MUSCLE TONE:    DTRs:    POSTURE:  No Significant postural limitations  Cervical ROM:    Active A/PROM (deg) eval  Flexion WNL  Extension WNL  Right lateral flexion WNL  Left lateral flexion WNL  Right rotation WNL  Left rotation WNL  (Blank rows = not tested)  Looking up brings on symptoms  STRENGTH:   LOWER EXTREMITY MMT:   MMT Right eval Left eval   Hip flexion    Hip abduction    Hip adduction    Hip internal rotation    Hip external rotation    Knee flexion    Knee extension    Ankle dorsiflexion    Ankle plantarflexion    Ankle inversion    Ankle eversion    (Blank rows = not tested)  BED MOBILITY:  independent  TRANSFERS: Assistive device utilized: None  Sit to stand: Complete Independence Stand to sit: Complete Independence Chair to chair: Complete Independence Floor:  DNT  RAMP:   CURB:   GAIT: Gait pattern: decreased stride length Distance walked:  Assistive device utilized: None Level of assistance: Complete Independence Comments: ambulates with short stride/increased double limb support time, somewhat tentative in her confidence for gait  FUNCTIONAL TESTS:  PATIENT SURVEYS:    VESTIBULAR ASSESSMENT:  GENERAL OBSERVATION: wears bifocals   SYMPTOM BEHAVIOR:  Subjective history: hx of long-COVID and notes pervasive symptoms ever since  Non-Vestibular symptoms:  none  Type of dizziness: Spinning/Vertigo and Unsteady with head/body turns  Frequency: position change  Duration: 3-7 sec  Aggravating factors: Induced by position change: lying supine and supine to sit and Worse in the dark  Relieving factors: closing eyes and slow movements  Progression of symptoms: unchanged  OCULOMOTOR EXAM:  Ocular Alignment: normal  Ocular ROM: No Limitations  Spontaneous Nystagmus: absent  Gaze-Induced Nystagmus: absent  Smooth Pursuits: intact  Saccades: hypometric/undershoots  Convergence/Divergence: 4 cm     VESTIBULAR - OCULAR REFLEX:   Slow VOR: Normal  VOR Cancellation: Normal  Head-Impulse Test: HIT Right: negative HIT Left: negative  Dynamic Visual Acuity: Not able to be assessed   POSITIONAL TESTING: Right Dix-Hallpike: no nystagmus and experiences spinning Left Dix-Hallpike: no nystagmus and minimal symptoms as compared to right Right Roll Test: no nystagmus and feels spinning 3-7  sec Left Roll Test: no nystagmus and less as compared to right  When transitioning Supine to sit: around 25-35 degrees elevation she feels most dramatic spinning sensation  MOTION SENSITIVITY:  Motion Sensitivity Quotient Intensity: 0 = none, 1 = Lightheaded, 2 = Mild, 3 = Moderate, 4 = Severe, 5 = Vomiting  Intensity  1. Sitting to supine   2. Supine to L side   3. Supine to R side   4. Supine to sitting   5. L Hallpike-Dix   6. Up from L    7. R Hallpike-Dix   8. Up from R    9. Sitting, head tipped to L knee   10. Head up from L knee   11. Sitting, head tipped to R knee   12. Head up from R knee   13. Sitting head turns x5   14.Sitting head nods x5   15. In stance, 180 turn to L    16. In stance, 180 turn to R     OTHOSTATICS: not done  FUNCTIONAL GAIT: Functional gait assessment: NT  M-CTSIB  Condition 1: Firm Surface, EO 30 Sec, Normal Sway  Condition 2: Firm Surface, EC 30 Sec, Mild Sway  Condition 3: Foam Surface, EO 30 Sec, Mild Sway  Condition 4: Foam Surface, EC 6 Sec, Severe Sway     VESTIBULAR TREATMENT:                                                                                                   DATE:   Canalith Repositioning:  Comment: unable to fully determine to attempt Gaze Adaptation:  N/A Habituation:  Brandt-Daroff: comment: symptoms with right vs left and Repeated Rolling: number of reps: 5 Other:   PATIENT EDUCATION: Education details: assessment findings and rationale Person educated: Patient and Spouse Education method: Explanation, Demonstration, and Handouts Education comprehension: needs further education  HOME EXERCISE PROGRAM: Access Code: [email or web address] URL: [email or web address] Date: 08/18/2022 Prepared by: Sherlyn Lees  Exercises - Brandt-Daroff Vestibular Exercise  - 1 x daily - 7 x  weekly - 1-3 sets - 5 reps - Supine to Right Sidelying Vestibular Habituation  - 1 x daily - 7 x weekly - 1-3 sets - 5  reps  GOALS: Goals reviewed with patient? Yes  SHORT TERM GOALS: Target date: 09/08/2022    Patient will be independent in HEP to improve functional outcomes Baseline: Goal status: IN PROGRESS  2.  Patient will be free of positional vertigo to improve quality of life and reduce unsteadiness and risk for falls Baseline: symptoms with arising from supine and right rolling Goal status: IN PROGRESS    LONG TERM GOALS: Target date: 09/29/2022    Demo improved vestibular function and safety in low light conditions per mild sway x 30 sec condition 4 M-CTSIB Baseline: 6 seconds, severe Goal status: IN PROGRESS  2.  Manifest improved balance and low risk for falls per score 25/30 Functional Gait Assessment to improve safety with household and community mobility Baseline: NT Goal status: IN PROGRESS    ASSESSMENT:  CLINICAL IMPRESSION: Patient arrived to session without new complaints. Trialed gait training on treadmill with patient again fatiguing quickly; responded well to cueing for improved step length and heel-toe pattern. SLS activities required cueing for lateral weight shift; still requiring up to min A to recover balance. Updated HEP for practice at home with husband's direction supervision for safety. Both reported understanding and without complaints at end of session. Upon chart review, patient's vitals were slower to normal in February 2023 compared to currently, despite patient's COVID hospitalization being more recent at that time (October 2022). Notified patient's Pulmonologist of these changes in vitals.   OBJECTIVE IMPAIRMENTS: Abnormal gait, decreased activity tolerance, decreased balance, decreased knowledge of use of DME, difficulty walking, and dizziness.   ACTIVITY LIMITATIONS: carrying, lifting, bending, standing, stairs, bathing, reach over head, and locomotion level  PARTICIPATION LIMITATIONS: meal prep, laundry, driving, and community activity  PERSONAL  FACTORS: Time since onset of injury/illness/exacerbation and 1 comorbidity: COVID, HTN  are also affecting patient's functional outcome.   REHAB POTENTIAL: Good  CLINICAL DECISION MAKING: Stable/uncomplicated  EVALUATION COMPLEXITY: Low   PLAN:  PT FREQUENCY: 1-2x/week, plan is 2x/wk x 3 wks then 1x/wk x 3 wks  PT DURATION: 6 weeks  PLANNED INTERVENTIONS: Therapeutic exercises, Therapeutic activity, Neuromuscular re-education, Balance training, Gait training, Patient/Family education, Self Care, Joint mobilization, Stair training, Vestibular training, Canalith repositioning, DME instructions, Aquatic Therapy, Dry Needling, Electrical stimulation, Spinal mobilization, Cryotherapy, Moist heat, Taping, and Manual therapy  PLAN FOR NEXT SESSION: monitor O2 and HR with activity; gait training with SPC, 4WW; continue with balance activities   Janene Harvey, PT, DPT 09/20/22 4:13 PM  [facility] [address] [place], [place] [address] Phone # [phone] Fax # [phone]

## 2022-09-20 ENCOUNTER — Telehealth: Payer: Self-pay | Admitting: Physical Therapy

## 2022-09-20 ENCOUNTER — Ambulatory Visit: Payer: Medicare Other | Attending: Family Medicine | Admitting: Physical Therapy

## 2022-09-20 ENCOUNTER — Encounter: Payer: Self-pay | Admitting: Physical Therapy

## 2022-09-20 DIAGNOSIS — R2681 Unsteadiness on feet: Secondary | ICD-10-CM

## 2022-09-20 DIAGNOSIS — R42 Dizziness and giddiness: Secondary | ICD-10-CM | POA: Diagnosis present

## 2022-09-20 DIAGNOSIS — M6281 Muscle weakness (generalized): Secondary | ICD-10-CM | POA: Insufficient documentation

## 2022-09-20 DIAGNOSIS — R262 Difficulty in walking, not elsewhere classified: Secondary | ICD-10-CM | POA: Diagnosis present

## 2022-09-20 NOTE — Telephone Encounter (Signed)
Hi Dr. Vaughan Browner,  I am seeing Mrs. Scogin in OPPT for balance and dizziness. She is struggling with significant SOB, decrease in spO2, and elevation in HR with exertion. Initially patient identified past COVID hospitalization (October 2022) as cause of symptoms. However, upon further chart review, patient's vitals appeared closer to normal during OPPT sessions in February 2023 than current. Please see below vitals from today's session:   Vitals after 2 minute treadmill walk at 0.8-1.1 mph: 82% spO2, 122 bpm  Please advise if any changes to POC.  Thanks!  Janene Harvey, PT, DPT 09/20/22 4:10 PM  [facility] at [facility] [address] [place], [place], [place] [address] Phone # [phone] Fax # [phone]

## 2022-09-25 ENCOUNTER — Ambulatory Visit
Admission: RE | Admit: 2022-09-25 | Discharge: 2022-09-25 | Disposition: A | Discharge status: Home / Self Care | Payer: Medicare Other | Source: Ambulatory Visit | Attending: Pulmonary Disease | Admitting: Pulmonary Disease

## 2022-09-25 DIAGNOSIS — J849 Interstitial pulmonary disease, unspecified: Secondary | ICD-10-CM

## 2022-09-25 DIAGNOSIS — U099 Post covid-19 condition, unspecified: Secondary | ICD-10-CM

## 2022-09-26 NOTE — Therapy (Signed)
OUTPATIENT PHYSICAL THERAPY VESTIBULAR TREATMENT     Patient Name: Tracy Young MRN: [id number] DOB:06-25-1946, 77 y.o., female Today's Date: 09/26/2022  END OF SESSION:           Past Medical History:  Diagnosis Date   Chronic kidney disease, stage 3a ([place]) 06/27/2021   COVID-19    GERD without esophagitis 06/27/2021   Hypertension    Mild intermittent asthma without complication 14/48/1856   Mixed hyperlipidemia 06/27/2021   Pneumonia    Past Surgical History:  Procedure Laterality Date   TOTAL KNEE ARTHROPLASTY     Patient Active Problem List   Diagnosis Date Noted   COVID-19 virus infection 06/27/2021   Acute respiratory failure with hypoxia ([place]) 06/27/2021   Chronic kidney disease, stage 3a ([place]) 06/27/2021   GERD without esophagitis 06/27/2021   Mixed hyperlipidemia 06/27/2021   Mild intermittent asthma with (acute) exacerbation 06/27/2021   Sepsis due to COVID-19 ([place]) 06/27/2021    PCP: Maury Dus, MD REFERRING PROVIDER: Maury Dus, MD  REFERRING DIAG: R42 (ICD-10-CM) - Dizziness and giddiness  THERAPY DIAG:  No diagnosis found.  ONSET DATE: July-August 2023  Rationale for Evaluation and Treatment: Rehabilitation  SUBJECTIVE:   SUBJECTIVE STATEMENT: Had some bloodwork that suggested pre-DM.   Pt accompanied by: significant other  PERTINENT HISTORY: Patient presents for assessment since fall at home yesterday. Patient has had intermittent episodes of dizziness and lightheadedness since having COVID and also being on amlodipine. Patient denies any syncope however she did hit her head. With intermittent dizziness today she wanted to get evaluated. Patient is on aspirin. No neurologic signs or symptoms. Patient had mild injury to the right arm no pain with movement however skin abrasion and put a Band-Aid on.  PAIN:  Are you having pain? No   PRECAUTIONS: Fall  WEIGHT BEARING RESTRICTIONS: No  FALLS: Has patient fallen in last 6  months? Yes. Number of falls 6-7  LIVING ENVIRONMENT: Lives with: lives with their spouse Lives in: House/apartment Stairs: Yes: Internal: does not use steps; bilateral but cannot reach both and External: 3-4 steps; on right going up Has following equipment at home: Walker - 4 wheeled, uses rollator at night  PLOF: Independent  PATIENT GOALS: eliminate symptoms  OBJECTIVE:    TODAY'S TREATMENT: 09/27/22 Activity Comments                       HOME EXERCISE PROGRAM Last updated: 09/21/22 Access Code: [email or web address] URL: [email or web address] Date: 09/20/2022 Prepared by: [facility]  Program Notes walk 2-3 minutes in the house; monitor vitals and stop if spO2 130 bpm or if other symptoms occur. perform this 3x/day.   Exercises - Narrow Stance with Counter Support  - 1 x daily - 5 x weekly - 2-3 sets - 30 sec hold - Romberg Stance with Eyes Closed  - 1 x daily - 5 x weekly - 2-3 sets - 30 sec hold - Corner Balance Feet Apart: Eyes Closed With Head Turns  - 1 x daily - 5 x weekly - 2-3 sets - 30 sec hold - Wide Stance with Eyes Closed and Head Nods  - 1 x daily - 5 x weekly - 2-3 sets - 30 sec hold - Side Step Overs with Cones and Counter Support  - 1 x daily - 5 x weekly - 2 sets - 10 reps - Figure-8 Walking Around Cones  - 1 x daily - 5 x weekly -  2 sets - 10 reps - Standing Toe Taps  - 1 x daily - 5 x weekly - 3 sets - 30 sec hold     Below measures were taken at time of initial evaluation unless otherwise specified:   DIAGNOSTIC FINDINGS: IMPRESSION: 1. No evidence of acute abnormality. 2. High posterior left scalp contusion without acute calvarial fracture.  COGNITION: Overall cognitive status: Within functional limits for tasks assessed   SENSATION: WFL  EDEMA:    MUSCLE TONE:    DTRs:    POSTURE:  No Significant postural limitations  Cervical ROM:    Active A/PROM (deg) eval  Flexion WNL   Extension WNL  Right lateral flexion WNL  Left lateral flexion WNL  Right rotation WNL  Left rotation WNL  (Blank rows = not tested)  Looking up brings on symptoms  STRENGTH:   LOWER EXTREMITY MMT:   MMT Right eval Left eval  Hip flexion    Hip abduction    Hip adduction    Hip internal rotation    Hip external rotation    Knee flexion    Knee extension    Ankle dorsiflexion    Ankle plantarflexion    Ankle inversion    Ankle eversion    (Blank rows = not tested)  BED MOBILITY:  independent  TRANSFERS: Assistive device utilized: None  Sit to stand: Complete Independence Stand to sit: Complete Independence Chair to chair: Complete Independence Floor:  DNT  RAMP:   CURB:   GAIT: Gait pattern: decreased stride length Distance walked:  Assistive device utilized: None Level of assistance: Complete Independence Comments: ambulates with short stride/increased double limb support time, somewhat tentative in her confidence for gait  FUNCTIONAL TESTS:    PATIENT SURVEYS:    VESTIBULAR ASSESSMENT:  GENERAL OBSERVATION: wears bifocals   SYMPTOM BEHAVIOR:  Subjective history: hx of long-COVID and notes pervasive symptoms ever since  Non-Vestibular symptoms:  none  Type of dizziness: Spinning/Vertigo and Unsteady with head/body turns  Frequency: position change  Duration: 3-7 sec  Aggravating factors: Induced by position change: lying supine and supine to sit and Worse in the dark  Relieving factors: closing eyes and slow movements  Progression of symptoms: unchanged  OCULOMOTOR EXAM:  Ocular Alignment: normal  Ocular ROM: No Limitations  Spontaneous Nystagmus: absent  Gaze-Induced Nystagmus: absent  Smooth Pursuits: intact  Saccades: hypometric/undershoots  Convergence/Divergence: 4 cm     VESTIBULAR - OCULAR REFLEX:   Slow VOR: Normal  VOR Cancellation: Normal  Head-Impulse Test: HIT Right: negative HIT Left: negative  Dynamic Visual Acuity:  Not able to be assessed   POSITIONAL TESTING: Right Dix-Hallpike: no nystagmus and experiences spinning Left Dix-Hallpike: no nystagmus and minimal symptoms as compared to right Right Roll Test: no nystagmus and feels spinning 3-7 sec Left Roll Test: no nystagmus and less as compared to right  When transitioning Supine to sit: around 25-35 degrees elevation she feels most dramatic spinning sensation  MOTION SENSITIVITY:  Motion Sensitivity Quotient Intensity: 0 = none, 1 = Lightheaded, 2 = Mild, 3 = Moderate, 4 = Severe, 5 = Vomiting  Intensity  1. Sitting to supine   2. Supine to L side   3. Supine to R side   4. Supine to sitting   5. L Hallpike-Dix   6. Up from L    7. R Hallpike-Dix   8. Up from R    9. Sitting, head tipped to L knee   10. Head up from L knee  11. Sitting, head tipped to R knee   12. Head up from R knee   13. Sitting head turns x5   14.Sitting head nods x5   15. In stance, 180 turn to L    16. In stance, 180 turn to R     OTHOSTATICS: not done  FUNCTIONAL GAIT: Functional gait assessment: NT  M-CTSIB  Condition 1: Firm Surface, EO 30 Sec, Normal Sway  Condition 2: Firm Surface, EC 30 Sec, Mild Sway  Condition 3: Foam Surface, EO 30 Sec, Mild Sway  Condition 4: Foam Surface, EC 6 Sec, Severe Sway     VESTIBULAR TREATMENT:                                                                                                   DATE:   Canalith Repositioning:  Comment: unable to fully determine to attempt Gaze Adaptation:  N/A Habituation:  Brandt-Daroff: comment: symptoms with right vs left and Repeated Rolling: number of reps: 5 Other:   PATIENT EDUCATION: Education details: assessment findings and rationale Person educated: Patient and Spouse Education method: Explanation, Demonstration, and Handouts Education comprehension: needs further education  HOME EXERCISE PROGRAM: Access Code: [email or web address] URL: [email or web address] Date:  08/18/2022 Prepared by: Sherlyn Lees  Exercises - Brandt-Daroff Vestibular Exercise  - 1 x daily - 7 x weekly - 1-3 sets - 5 reps - Supine to Right Sidelying Vestibular Habituation  - 1 x daily - 7 x weekly - 1-3 sets - 5 reps  GOALS: Goals reviewed with patient? Yes  SHORT TERM GOALS: Target date: 09/08/2022    Patient will be independent in HEP to improve functional outcomes Baseline: Goal status: IN PROGRESS  2.  Patient will be free of positional vertigo to improve quality of life and reduce unsteadiness and risk for falls Baseline: symptoms with arising from supine and right rolling Goal status: IN PROGRESS    LONG TERM GOALS: Target date: 09/29/2022    Demo improved vestibular function and safety in low light conditions per mild sway x 30 sec condition 4 M-CTSIB Baseline: 6 seconds, severe Goal status: IN PROGRESS  2.  Manifest improved balance and low risk for falls per score 25/30 Functional Gait Assessment to improve safety with household and community mobility Baseline: NT Goal status: IN PROGRESS    ASSESSMENT:  CLINICAL IMPRESSION: Patient arrived to session without new complaints. Trialed gait training on treadmill with patient again fatiguing quickly; responded well to cueing for improved step length and heel-toe pattern. SLS activities required cueing for lateral weight shift; still requiring up to min A to recover balance. Updated HEP for practice at home with husband's direction supervision for safety. Both reported understanding and without complaints at end of session. Upon chart review, patient's vitals were slower to normal in February 2023 compared to currently, despite patient's COVID hospitalization being more recent at that time (October 2022). Notified patient's Pulmonologist of these changes in vitals.   OBJECTIVE IMPAIRMENTS: Abnormal gait, decreased activity tolerance, decreased balance, decreased knowledge of use of DME, difficulty walking, and  dizziness.   ACTIVITY LIMITATIONS: carrying, lifting,  bending, standing, stairs, bathing, reach over head, and locomotion level  PARTICIPATION LIMITATIONS: meal prep, laundry, driving, and community activity  PERSONAL FACTORS: Time since onset of injury/illness/exacerbation and 1 comorbidity: COVID, HTN  are also affecting patient's functional outcome.   REHAB POTENTIAL: Good  CLINICAL DECISION MAKING: Stable/uncomplicated  EVALUATION COMPLEXITY: Low   PLAN:  PT FREQUENCY: 1-2x/week, plan is 2x/wk x 3 wks then 1x/wk x 3 wks  PT DURATION: 6 weeks  PLANNED INTERVENTIONS: Therapeutic exercises, Therapeutic activity, Neuromuscular re-education, Balance training, Gait training, Patient/Family education, Self Care, Joint mobilization, Stair training, Vestibular training, Canalith repositioning, DME instructions, Aquatic Therapy, Dry Needling, Electrical stimulation, Spinal mobilization, Cryotherapy, Moist heat, Taping, and Manual therapy  PLAN FOR NEXT SESSION: monitor O2 and HR with activity; gait training with SPC, 4WW; continue with balance activities   Janene Harvey, PT, DPT 09/26/22 12:50 PM  [facility] at [facility] [address] [place], [place] [address] Phone # [phone] Fax # [phone]

## 2022-09-27 ENCOUNTER — Encounter: Payer: Self-pay | Admitting: Pulmonary Disease

## 2022-09-27 ENCOUNTER — Ambulatory Visit (INDEPENDENT_AMBULATORY_CARE_PROVIDER_SITE_OTHER): Payer: Medicare Other | Admitting: Pulmonary Disease

## 2022-09-27 ENCOUNTER — Encounter: Payer: Self-pay | Admitting: Physical Therapy

## 2022-09-27 ENCOUNTER — Ambulatory Visit: Payer: Medicare Other | Admitting: Physical Therapy

## 2022-09-27 VITALS — BP 138/80 | HR 98 | Temp 98.0°F | Ht 64.5 in | Wt 176.0 lb

## 2022-09-27 DIAGNOSIS — R2681 Unsteadiness on feet: Secondary | ICD-10-CM

## 2022-09-27 DIAGNOSIS — J849 Interstitial pulmonary disease, unspecified: Secondary | ICD-10-CM | POA: Diagnosis not present

## 2022-09-27 DIAGNOSIS — R42 Dizziness and giddiness: Secondary | ICD-10-CM

## 2022-09-27 DIAGNOSIS — R262 Difficulty in walking, not elsewhere classified: Secondary | ICD-10-CM

## 2022-09-27 DIAGNOSIS — U099 Post covid-19 condition, unspecified: Secondary | ICD-10-CM

## 2022-09-27 DIAGNOSIS — M6281 Muscle weakness (generalized): Secondary | ICD-10-CM

## 2022-09-27 LAB — PULMONARY FUNCTION TEST
DL/VA % pred: 100 %
DL/VA: 4.1 ml/min/mmHg/L
DLCO cor % pred: 60 %
DLCO cor: 11.87 ml/min/mmHg
DLCO unc % pred: 60 %
DLCO unc: 11.87 ml/min/mmHg
FEF 25-75 Post: 4.24 L/sec
FEF 25-75 Pre: 3.81 L/sec
FEF2575-%Change-Post: 11 %
FEF2575-%Pred-Post: 244 %
FEF2575-%Pred-Pre: 219 %
FEV1-%Change-Post: 0 %
FEV1-%Pred-Post: 87 %
FEV1-%Pred-Pre: 86 %
FEV1-Post: 1.92 L
FEV1-Pre: 1.9 L
FEV1FVC-%Change-Post: 0 %
FEV1FVC-%Pred-Pre: 126 %
FEV6-%Change-Post: 1 %
FEV6-%Pred-Post: 72 %
FEV6-%Pred-Pre: 72 %
FEV6-Post: 2.02 L
FEV6-Pre: 2 L
FEV6FVC-%Pred-Post: 105 %
FEV6FVC-%Pred-Pre: 105 %
FVC-%Change-Post: 1 %
FVC-%Pred-Post: 69 %
FVC-%Pred-Pre: 68 %
FVC-Post: 2.02 L
FVC-Pre: 2 L
Post FEV1/FVC ratio: 95 %
Post FEV6/FVC ratio: 100 %
Pre FEV1/FVC ratio: 95 %
Pre FEV6/FVC Ratio: 100 %
RV % pred: 64 %
RV: 1.48 L
TLC % pred: 73 %
TLC: 3.75 L

## 2022-09-27 NOTE — Progress Notes (Signed)
Full PFT performed today. °

## 2022-09-27 NOTE — Telephone Encounter (Signed)
I saw her in office today Can we start her on 2 lt O2 with exercise. Don't think she will need it at home. Thanks

## 2022-09-27 NOTE — Progress Notes (Signed)
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  Required high flow nasal cannula.  She was treated with steroids, baricitinib and gradually weaned down to 6 L oxygen on discharge.  She also got 10 days of prednisone on discharge Hospital course noted for elevated D-dimer with negative CT angiogram and lower extremity Dopplers for clot.  She was advised to use daily aspirin at discharge  Post discharge she is improved to 2 L oxygen but continues to have significant dyspnea on exertion, chronic cough which is nonproductive in nature.  No fevers or chills  Currently on Symbicort inhaler which she is using twice daily  Pets: No pets Occupation: Retired Optometrist Exposures: No exposures. Smoking history: Non-smoker Travel history: No significant travel history Relevant family history: No family history of lung disease  Interim history: Continues to make improvements with regard to breathing.  She is off oxygen but her rehab therapist showed noted some readings in the 80s  Here for review of CT and PFTs.  Outpatient Encounter Medications as of 09/27/2022  Medication Sig   albuterol (VENTOLIN HFA) 108 (90 Base) MCG/ACT inhaler Inhale 2 puffs into the lungs every 4 (four) hours as needed for wheezing or shortness of breath.   Ascorbic Acid (VITAMIN C) 1000 MG tablet Take 1,000 mg by mouth every morning.   buPROPion (WELLBUTRIN XL) 300 MG 24 hr tablet Take 300 mg by mouth daily.   Cholecalciferol (VITAMIN D-3) 125 MCG (5000 UT) TABS Take 5,000 Units by mouth every morning.   Cyanocobalamin (VITAMIN B-12 PO) Take 1 tablet by mouth every morning.    DULoxetine (CYMBALTA) 60 MG capsule Take 60 mg by mouth at bedtime.   famotidine (PEPCID) 40 MG tablet Take 40 mg by mouth at bedtime.   losartan (COZAAR) 25 MG tablet Take 1 tablet (25 mg total) by mouth in the morning and at bedtime.   Multiple Minerals-Vitamins (CALCIUM CITRATE-MAG-MINERALS) TABS Take 1 tablet by mouth every morning. Calcium Citrate plus magnesium and zinc   NYAMYC powder Apply 1 application topically daily. Apply to legs after showering   pantoprazole (PROTONIX) 40 MG tablet Take 40 mg by mouth every morning.   raloxifene (EVISTA) 60 MG tablet Take 60 mg by mouth daily.   rosuvastatin (CRESTOR) 20 MG tablet Take 1 tablet (20 mg total) by mouth daily.   TRELEGY ELLIPTA 200-62.5-25 MCG/ACT AEPB INHALE ONE PUFF BY MOUTH DAILY   [DISCONTINUED] aspirin EC 81 MG tablet Take 1 tablet (81 mg total) by mouth daily. Swallow whole.   No facility-administered encounter medications on file as of 09/27/2022.    Physical Exam: Blood pressure 138/80, pulse 98, temperature 98 F (36.7 C), temperature source Oral, height 5' 4.5" (1.638 m), weight 176 lb (79.8 kg), SpO2 94 %. Gen:      No acute distress HEENT:  EOMI, sclera anicteric Neck:     No masses; no thyromegaly Lungs:    Clear to auscultation bilaterally; normal respiratory effort CV:         Regular rate and rhythm; no murmurs Abd:      + bowel sounds; soft, non-tender; no palpable masses, no distension Ext:  No edema; adequate peripheral perfusion Skin:      Warm and dry; no rash Neuro: alert and oriented x 3 Psych: normal mood and affect   Data Reviewed: Imaging: CTA 06/28/2021-no pulmonary embolism, bilateral confluent groundglass opacities CTA 07/03/2021-no pulmonary embolism, bilateral groundglass opacities with mild improvement High-resolution CT 08/08/2021-evolving interstitial lung disease 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  Stable  compared to 2022 I have reviewed the images personally.  PFTs: 09/28/2021 FVC 2.09 [90%], FEV1 1.90 [85%], F/F 91, TLC 3.27 [63%], DLCO 8.61 [43%] Moderate restriction, severe diffusion defect  09/27/2022 FVC 2.02 [69%], FEV1 1.92 [87%], F/F95, TLC 3.75 [73%], DLCO 11.87 [60%] Mild restriction, moderate diffusion defect  Labs:   Cardiac: Echocardiogram 09/13/2021 LVEF 65-70%, normal PA systolic pressure, RV size and function is normal.   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  DuoNebs as needed Off supplemental oxygen though she may need a few liters oxygen while exercising in rehab as her sats are dipping down into the low 80s  CT shows stable pattern of pulmonary fibrosis and present improvement in PFTs which is encouraging Follow-up in 1 year with high-res CT and PFTs  Plan/Recommendations: Continue trelegy Exercise therapy with supplemental oxygen  Marshell Garfinkel MD [facility] 09/27/2022, 1:10 PM  CC: Maury Dus, MD

## 2022-09-27 NOTE — Patient Instructions (Signed)
Full PFT performed today. °

## 2022-09-27 NOTE — Patient Instructions (Addendum)
I am glad you are doing well with regard to breathing Your PFT showed improvement which is good news We will get follow-up CT and PFTs in 1 year and return to clinic

## 2022-09-29 NOTE — Therapy (Signed)
OUTPATIENT PHYSICAL THERAPY VESTIBULAR NOTE     Patient Name: Tracy Young MRN: [id number] DOB:Jun 20, 1946, 77 y.o., female Today's Date: 10/02/2022     END OF SESSION:  PT End of Session - 10/02/22 1702     Visit Number 10    Number of Visits 21    Date for PT Re-Evaluation 11/08/22    Authorization Type Medicare A & B    Authorization Time Period 10    Progress Note Due on Visit 67    PT Start Time 1619    PT Stop Time 9562    PT Time Calculation (min) 39 min    Equipment Utilized During Treatment Gait belt    Activity Tolerance Patient tolerated treatment well;Patient limited by fatigue   fatigue, SOB   Behavior During Therapy WFL for tasks assessed/performed                      Past Medical History:  Diagnosis Date   Chronic kidney disease, stage 3a ([place]) 06/27/2021   COVID-19    GERD without esophagitis 06/27/2021   Hypertension    Mild intermittent asthma without complication 13/04/6577   Mixed hyperlipidemia 06/27/2021   Pneumonia    Past Surgical History:  Procedure Laterality Date   TOTAL KNEE ARTHROPLASTY     Patient Active Problem List   Diagnosis Date Noted   COVID-19 virus infection 06/27/2021   Acute respiratory failure with hypoxia ([place]) 06/27/2021   Chronic kidney disease, stage 3a ([place]) 06/27/2021   GERD without esophagitis 06/27/2021   Mixed hyperlipidemia 06/27/2021   Mild intermittent asthma with (acute) exacerbation 06/27/2021   Sepsis due to COVID-19 ([place]) 06/27/2021    PCP: Maury Dus, MD REFERRING PROVIDER: Maury Dus, MD  REFERRING DIAG: R42 (ICD-10-CM) - Dizziness and giddiness  THERAPY DIAG:  Unsteadiness on feet  Difficulty in walking, not elsewhere classified  Dizziness and giddiness  Muscle weakness (generalized)  ONSET DATE: July-August 2023  Rationale for Evaluation and Treatment: Rehabilitation  SUBJECTIVE:   SUBJECTIVE STATEMENT: Been walking 3 times since last session. Was able to do up  to 7 and 8 minutes. Gauging duration of walking by her HR. Taking readings of blood glucose for her PCP.   Pt accompanied by: significant other  PERTINENT HISTORY: Patient presents for assessment since fall at home yesterday. Patient has had intermittent episodes of dizziness and lightheadedness since having COVID and also being on amlodipine. Patient denies any syncope however she did hit her head. With intermittent dizziness today she wanted to get evaluated. Patient is on aspirin. No neurologic signs or symptoms. Patient had mild injury to the right arm no pain with movement however skin abrasion and put a Band-Aid on.  PAIN:  Are you having pain? No   PRECAUTIONS: Fall Per husband- "okay with numbers (vitals) going down as long as she recovers within 2 minutes."   WEIGHT BEARING RESTRICTIONS: No  FALLS: Has patient fallen in last 6 months? Yes. Number of falls 6-7  LIVING ENVIRONMENT: Lives with: lives with their spouse Lives in: House/apartment Stairs: Yes: Internal: does not use steps; bilateral but cannot reach both and External: 3-4 steps; on right going up Has following equipment at home: Walker - 4 wheeled, uses rollator at night  PLOF: Independent  PATIENT GOALS: eliminate symptoms  OBJECTIVE:      TODAY'S TREATMENT: 10/02/22 Activity Comments  Vitals at start of session 95% spO2, 101 bpm  Treadmill walk at 1.5 mph 2 min intervals x 2  times with standing rest break in between Encouraging fewer but longer steps; good heel toe pattern; after 2 min: 85%, 121 bpm; after last 2 min: 86% spO2, 123 bpm; proceeded once vitals normalized   Performed at counter top in 6.5 min without rest break: backwards walking 6x walking EC 6x fwd/back stepping + head nods to targets 10x each  87% spO2 and 128 bpm after completion; tried recovery in standing but HR slow to recover so had patient sit to recover; waited to proceed until vitals normalized   Performed at counter top in 6.5 min  without rest break:   alt toe tap on sink cabinet 2x30" Romberg EC 2x30" mini squat 10x Sidestepping along counter 4x 87% spO2 and 132 bpm after completion; tried recovery in standing but HR slow to recover so had patient sit to recover; waited to proceed until vitals normalized       HOME EXERCISE PROGRAM Last updated: 10/02/22 Access Code: [facility] URL: [email or web address] Date: 09/20/2022 Prepared by: [facility]  Program Notes walk 2-3 minutes in the house; monitor vitals and stop if spO2 130 bpm or if other symptoms occur. perform this 3x/day.   Exercises - Narrow Stance with Counter Support  - 1 x daily - 5 x weekly - 2-3 sets - 30 sec hold - Romberg Stance with Eyes Closed  - 1 x daily - 5 x weekly - 2-3 sets - 30 sec hold - Corner Balance Feet Apart: Eyes Closed With Head Turns  - 1 x daily - 5 x weekly - 2-3 sets - 30 sec hold - Wide Stance with Eyes Closed and Head Nods  - 1 x daily - 5 x weekly - 2-3 sets - 30 sec hold - Side Step Overs with Cones and Counter Support  - 1 x daily - 5 x weekly - 2 sets - 10 reps - Figure-8 Walking Around [employer]  - 1 x daily - 5 x weekly - 2 sets - 10 reps - Standing Toe Taps  - 1 x daily - 5 x weekly - 3 sets - 30 sec hold    PATIENT EDUCATION: Education details: HEP update and encouraged monitoring of vitals during exercises, to be performed in intervals as was done today and to stop if SOB or vitals spO2 < 90 or HR >130 bpm Person educated: Patient and Spouse Education method: Explanation, Demonstration, Tactile cues, Verbal cues, and Handouts Education comprehension: verbalized understanding and returned demonstration    Below measures were taken at time of initial evaluation unless otherwise specified:   DIAGNOSTIC FINDINGS: IMPRESSION: 1. No evidence of acute abnormality. 2. High posterior left scalp contusion without acute calvarial fracture.  COGNITION: Overall  cognitive status: Within functional limits for tasks assessed   SENSATION: WFL  EDEMA:    MUSCLE TONE:    DTRs:    POSTURE:  No Significant postural limitations  Cervical ROM:    Active A/PROM (deg) eval  Flexion WNL  Extension WNL  Right lateral flexion WNL  Left lateral flexion WNL  Right rotation WNL  Left rotation WNL  (Blank rows = not tested)  Looking up brings on symptoms  STRENGTH:   LOWER EXTREMITY MMT:   MMT Right eval Left eval  Hip flexion    Hip abduction    Hip adduction    Hip internal rotation    Hip external rotation    Knee flexion    Knee extension    Ankle dorsiflexion  Ankle plantarflexion    Ankle inversion    Ankle eversion    (Blank rows = not tested)  BED MOBILITY:  independent  TRANSFERS: Assistive device utilized: None  Sit to stand: Complete Independence Stand to sit: Complete Independence Chair to chair: Complete Independence Floor:  DNT  RAMP:   CURB:   GAIT: Gait pattern: decreased stride length Distance walked:  Assistive device utilized: None Level of assistance: Complete Independence Comments: ambulates with short stride/increased double limb support time, somewhat tentative in her confidence for gait  FUNCTIONAL TESTS:    PATIENT SURVEYS:    VESTIBULAR ASSESSMENT:  GENERAL OBSERVATION: wears bifocals   SYMPTOM BEHAVIOR:  Subjective history: hx of long-COVID and notes pervasive symptoms ever since  Non-Vestibular symptoms:  none  Type of dizziness: Spinning/Vertigo and Unsteady with head/body turns  Frequency: position change  Duration: 3-7 sec  Aggravating factors: Induced by position change: lying supine and supine to sit and Worse in the dark  Relieving factors: closing eyes and slow movements  Progression of symptoms: unchanged  OCULOMOTOR EXAM:  Ocular Alignment: normal  Ocular ROM: No Limitations  Spontaneous Nystagmus: absent  Gaze-Induced Nystagmus: absent  Smooth Pursuits:  intact  Saccades: hypometric/undershoots  Convergence/Divergence: 4 cm     VESTIBULAR - OCULAR REFLEX:   Slow VOR: Normal  VOR Cancellation: Normal  Head-Impulse Test: HIT Right: negative HIT Left: negative  Dynamic Visual Acuity: Not able to be assessed   POSITIONAL TESTING: Right Dix-Hallpike: no nystagmus and experiences spinning Left Dix-Hallpike: no nystagmus and minimal symptoms as compared to right Right Roll Test: no nystagmus and feels spinning 3-7 sec Left Roll Test: no nystagmus and less as compared to right  When transitioning Supine to sit: around 25-35 degrees elevation she feels most dramatic spinning sensation  MOTION SENSITIVITY:  Motion Sensitivity Quotient Intensity: 0 = none, 1 = Lightheaded, 2 = Mild, 3 = Moderate, 4 = Severe, 5 = Vomiting  Intensity  1. Sitting to supine   2. Supine to L side   3. Supine to R side   4. Supine to sitting   5. L Hallpike-Dix   6. Up from L    7. R Hallpike-Dix   8. Up from R    9. Sitting, head tipped to L knee   10. Head up from L knee   11. Sitting, head tipped to R knee   12. Head up from R knee   13. Sitting head turns x5   14.Sitting head nods x5   15. In stance, 180 turn to L    16. In stance, 180 turn to R     OTHOSTATICS: not done  FUNCTIONAL GAIT: Functional gait assessment: NT  M-CTSIB  Condition 1: Firm Surface, EO 30 Sec, Normal Sway  Condition 2: Firm Surface, EC 30 Sec, Mild Sway  Condition 3: Foam Surface, EO 30 Sec, Mild Sway  Condition 4: Foam Surface, EC 6 Sec, Severe Sway     VESTIBULAR TREATMENT:  DATE:   Canalith Repositioning:  Comment: unable to fully determine to attempt Gaze Adaptation:  N/A Habituation:  Brandt-Daroff: comment: symptoms with right vs left and Repeated Rolling: number of reps: 5 Other:   PATIENT EDUCATION: Education details: assessment findings and  rationale Person educated: Patient and Spouse Education method: Explanation, Demonstration, and Handouts Education comprehension: needs further education  HOME EXERCISE PROGRAM: Access Code: [email or web address] URL: [email or web address] Date: 08/18/2022 Prepared by: Sherlyn Lees  Exercises - Brandt-Daroff Vestibular Exercise  - 1 x daily - 7 x weekly - 1-3 sets - 5 reps - Supine to Right Sidelying Vestibular Habituation  - 1 x daily - 7 x weekly - 1-3 sets - 5 reps  GOALS: Goals reviewed with patient? Yes  SHORT TERM GOALS: Target date: 09/08/2022    Patient will be independent in HEP to improve functional outcomes Baseline: Goal status: MET   2.  Patient will be free of positional vertigo to improve quality of life and reduce unsteadiness and risk for falls Baseline: symptoms with arising from supine and right rolling; negative 09/27/22 Goal status: MET 09/27/22     LONG TERM GOALS: Target date: 11/08/2022    Demo improved vestibular function and safety in low light conditions per mild sway x 30 sec condition 4 M-CTSIB Baseline: 6 seconds, severe; 13 sec with mod-severe sway 09/27/22 Goal status: IN PROGRESS 09/27/22  2.  Manifest improved balance and low risk for falls per score 25/30 Functional Gait Assessment to improve safety with household and community mobility Baseline: 17/30 09/27/22 Goal status: IN PROGRESS 09/27/22  3. Patient to report tolerance for 10-15 minute walk without rest break with LRAD as needed for safety.  Baseline: tolerating 5 min  Goal status: IN PROGRESS  4.  Patient to report tolerance for cooking a meal without husband's help and without rest break required d/t fatigue or imbalance.   Baseline: tolerating 5 min  Goal status: IN PROGRESS       ASSESSMENT:  CLINICAL IMPRESSION: Patient arrived to session with report of slowly increasing tolerance for walking. O2 saturation was slightly better-maintained today with treadmill walking at  quicker pace than last time on treadmill. Worked on increasing standing tolerance by performing balance and LE strengthening activities in succession and without rest break until vitals or symptoms required sitting rest break. Patient tolerated 6.5 minutes of uninterrupted activity at a time today. Patient reported understanding of all edu provided and without complaints at end of session.    OBJECTIVE IMPAIRMENTS: Abnormal gait, decreased activity tolerance, decreased balance, decreased knowledge of use of DME, difficulty walking, and dizziness.   ACTIVITY LIMITATIONS: carrying, lifting, bending, standing, stairs, bathing, reach over head, and locomotion level  PARTICIPATION LIMITATIONS: meal prep, laundry, driving, and community activity  PERSONAL FACTORS: Time since onset of injury/illness/exacerbation and 1 comorbidity: COVID, HTN  are also affecting patient's functional outcome.   REHAB POTENTIAL: Good  CLINICAL DECISION MAKING: Stable/uncomplicated  EVALUATION COMPLEXITY: Low   PLAN:  PT FREQUENCY: 2x/week  PT DURATION: 6 weeks  PLANNED INTERVENTIONS: Therapeutic exercises, Therapeutic activity, Neuromuscular re-education, Balance training, Gait training, Patient/Family education, Self Care, Joint mobilization, Stair training, Vestibular training, Canalith repositioning, DME instructions, Aquatic Therapy, Dry Needling, Electrical stimulation, Spinal mobilization, Cryotherapy, Moist heat, Taping, and Manual therapy  PLAN FOR NEXT SESSION: monitor O2 and HR with activity; work on progressing standing/walking endurance, balance challenges: EC/foam, walking EC, walking backwards, narrow BOS, stepping over obstacles    Janene Harvey, PT, DPT 10/02/22 5:05 PM  [facility] [address] [place],  [address] Phone # [phone] Fax # [phone]

## 2022-10-02 ENCOUNTER — Ambulatory Visit: Payer: Medicare Other | Admitting: Physical Therapy

## 2022-10-02 ENCOUNTER — Encounter: Payer: Self-pay | Admitting: Physical Therapy

## 2022-10-02 DIAGNOSIS — R42 Dizziness and giddiness: Secondary | ICD-10-CM

## 2022-10-02 DIAGNOSIS — M6281 Muscle weakness (generalized): Secondary | ICD-10-CM

## 2022-10-02 DIAGNOSIS — R262 Difficulty in walking, not elsewhere classified: Secondary | ICD-10-CM

## 2022-10-02 DIAGNOSIS — R2681 Unsteadiness on feet: Secondary | ICD-10-CM

## 2022-10-03 NOTE — Therapy (Signed)
OUTPATIENT PHYSICAL THERAPY VESTIBULAR NOTE     Patient Name: Tracy Young MRN: [id number] DOB:03-25-1946, 77 y.o., female Today's Date: 10/04/2022     END OF SESSION:  PT End of Session - 10/04/22 1313     Visit Number 11    Number of Visits 21    Date for PT Re-Evaluation 11/08/22    Authorization Type Medicare A & B    Progress Note Due on Visit 40    PT Start Time 1231    PT Stop Time 1313    PT Time Calculation (min) 42 min    Equipment Utilized During Treatment Gait belt    Activity Tolerance Patient tolerated treatment well;Patient limited by fatigue   fatigue, SOB   Behavior During Therapy WFL for tasks assessed/performed                       Past Medical History:  Diagnosis Date   Chronic kidney disease, stage 3a ([place]) 06/27/2021   COVID-19    GERD without esophagitis 06/27/2021   Hypertension    Mild intermittent asthma without complication 16/96/7893   Mixed hyperlipidemia 06/27/2021   Pneumonia    Past Surgical History:  Procedure Laterality Date   TOTAL KNEE ARTHROPLASTY     Patient Active Problem List   Diagnosis Date Noted   COVID-19 virus infection 06/27/2021   Acute respiratory failure with hypoxia ([place]) 06/27/2021   Chronic kidney disease, stage 3a ([place]) 06/27/2021   GERD without esophagitis 06/27/2021   Mixed hyperlipidemia 06/27/2021   Mild intermittent asthma with (acute) exacerbation 06/27/2021   Sepsis due to COVID-19 ([place]) 06/27/2021    PCP: Maury Dus, MD REFERRING PROVIDER: Maury Dus, MD  REFERRING DIAG: R42 (ICD-10-CM) - Dizziness and giddiness  THERAPY DIAG:  Unsteadiness on feet  Difficulty in walking, not elsewhere classified  Dizziness and giddiness  Muscle weakness (generalized)  ONSET DATE: July-August 2023  Rationale for Evaluation and Treatment: Rehabilitation  SUBJECTIVE:   SUBJECTIVE STATEMENT: Got COVID shots after session on Monday and feeling tired the next day. Still note quite  back to normal today. Getting an echo this afternoon.   Pt accompanied by: significant other  PERTINENT HISTORY: Patient presents for assessment since fall at home yesterday. Patient has had intermittent episodes of dizziness and lightheadedness since having COVID and also being on amlodipine. Patient denies any syncope however she did hit her head. With intermittent dizziness today she wanted to get evaluated. Patient is on aspirin. No neurologic signs or symptoms. Patient had mild injury to the right arm no pain with movement however skin abrasion and put a Band-Aid on.  PAIN:  Are you having pain? No   PRECAUTIONS: Fall Per husband- "okay with numbers (vitals) going down as long as she recovers within 2 minutes."   WEIGHT BEARING RESTRICTIONS: No  FALLS: Has patient fallen in last 6 months? Yes. Number of falls 6-7  LIVING ENVIRONMENT: Lives with: lives with their spouse Lives in: House/apartment Stairs: Yes: Internal: does not use steps; bilateral but cannot reach both and External: 3-4 steps; on right going up Has following equipment at home: Walker - 4 wheeled, uses rollator at night  PLOF: Independent  PATIENT GOALS: eliminate symptoms  OBJECTIVE:     TODAY'S TREATMENT: 10/04/22 Activity Comments  Vitals at start of session  95% spO2, 102 bpm   TM walking 1.5 mph 2.5 min x 2 Cueing for larger steps; B UE support on TM rail   After 2.5  min: 90% spO2, 129 bpm Standing rest break ~ 68mn After last 2.5 min: 89% spO2,  128 bpm  Performed at counter top in 3 min without rest break: sidestepping over hurdle 10x mini squat 10x 86% spO2, 135 bpm after activity; allowed vitals to normalize before proceeding    Performed at counter top in 4.5 min without rest break: tandem walk 2x [place] 4x 90% spO2, 134 bpm after activity; allowed vitals to normalize before proceeding    Performed at counter top in 3.5 min without rest break:  alt toe tap on sink cabinet 2x30" 1/2 turns  to targets ~10 93% spO2, 133 bpm after activity; allowed vitals to normalize before proceeding; 1 LOB posteriorly requiring mod A to correct     Sitting rest breaks were spent educating on purse lip breathing, providing correction of form with activities, and discussing patient's current symptoms and vitals           HOME EXERCISE PROGRAM Last updated: 10/02/22 Access Code: [email or web address]: [email or web address] Date: 09/20/2022 Prepared by: [facility] Program Notes walk 2-3 minutes in the house; monitor vitals and stop if spO2 130 bpm or if other symptoms occur. perform this 3x/day.   Exercises - Narrow Stance with Counter Support  - 1 x daily - 5 x weekly - 2-3 sets - 30 sec hold - Romberg Stance with Eyes Closed  - 1 x daily - 5 x weekly - 2-3 sets - 30 sec hold - Corner Balance Feet Apart: Eyes Closed With Head Turns  - 1 x daily - 5 x weekly - 2-3 sets - 30 sec hold - Wide Stance with Eyes Closed and Head Nods  - 1 x daily - 5 x weekly - 2-3 sets - 30 sec hold - Side Step Overs with Cones and Counter Support  - 1 x daily - 5 x weekly - 2 sets - 10 reps - Figure-8 Walking Around Cones  - 1 x daily - 5 x weekly - 2 sets - 10 reps - Standing Toe Taps  - 1 x daily - 5 x weekly - 3 sets - 30 sec hold    Below measures were taken at time of initial evaluation unless otherwise specified:   DIAGNOSTIC FINDINGS: IMPRESSION: 1. No evidence of acute abnormality. 2. High posterior left scalp contusion without acute calvarial fracture.  COGNITION: Overall cognitive status: Within functional limits for tasks assessed   SENSATION: WFL  EDEMA:    MUSCLE TONE:    DTRs:    POSTURE:  No Significant postural limitations  Cervical ROM:    Active A/PROM (deg) eval  Flexion WNL  Extension WNL  Right lateral flexion WNL  Left lateral flexion WNL  Right rotation WNL  Left rotation WNL  (Blank rows = not tested)  Looking  up brings on symptoms  STRENGTH:   LOWER EXTREMITY MMT:   MMT Right eval Left eval  Hip flexion    Hip abduction    Hip adduction    Hip internal rotation    Hip external rotation    Knee flexion    Knee extension    Ankle dorsiflexion    Ankle plantarflexion    Ankle inversion    Ankle eversion    (Blank rows = not tested)  BED MOBILITY:  independent  TRANSFERS: Assistive device utilized: None  Sit to stand: Complete Independence Stand to sit: Complete Independence Chair to chair: Complete Independence Floor:  DNT  RAMP:   CURB:   GAIT: Gait pattern: decreased stride length Distance walked:  Assistive device utilized: None Level of assistance: Complete Independence Comments: ambulates with short stride/increased double limb support time, somewhat tentative in her confidence for gait  FUNCTIONAL TESTS:    PATIENT SURVEYS:    VESTIBULAR ASSESSMENT:  GENERAL OBSERVATION: wears bifocals   SYMPTOM BEHAVIOR:  Subjective history: hx of long-COVID and notes pervasive symptoms ever since  Non-Vestibular symptoms:  none  Type of dizziness: Spinning/Vertigo and Unsteady with head/body turns  Frequency: position change  Duration: 3-7 sec  Aggravating factors: Induced by position change: lying supine and supine to sit and Worse in the dark  Relieving factors: closing eyes and slow movements  Progression of symptoms: unchanged  OCULOMOTOR EXAM:  Ocular Alignment: normal  Ocular ROM: No Limitations  Spontaneous Nystagmus: absent  Gaze-Induced Nystagmus: absent  Smooth Pursuits: intact  Saccades: hypometric/undershoots  Convergence/Divergence: 4 cm     VESTIBULAR - OCULAR REFLEX:   Slow VOR: Normal  VOR Cancellation: Normal  Head-Impulse Test: HIT Right: negative HIT Left: negative  Dynamic Visual Acuity: Not able to be assessed   POSITIONAL TESTING: Right Dix-Hallpike: no nystagmus and experiences spinning Left Dix-Hallpike: no nystagmus and minimal  symptoms as compared to right Right Roll Test: no nystagmus and feels spinning 3-7 sec Left Roll Test: no nystagmus and less as compared to right  When transitioning Supine to sit: around 25-35 degrees elevation she feels most dramatic spinning sensation  MOTION SENSITIVITY:  Motion Sensitivity Quotient Intensity: 0 = none, 1 = Lightheaded, 2 = Mild, 3 = Moderate, 4 = Severe, 5 = Vomiting  Intensity  1. Sitting to supine   2. Supine to L side   3. Supine to R side   4. Supine to sitting   5. L Hallpike-Dix   6. Up from L    7. R Hallpike-Dix   8. Up from R    9. Sitting, head tipped to L knee   10. Head up from L knee   11. Sitting, head tipped to R knee   12. Head up from R knee   13. Sitting head turns x5   14.Sitting head nods x5   15. In stance, 180 turn to L    16. In stance, 180 turn to R     OTHOSTATICS: not done  FUNCTIONAL GAIT: Functional gait assessment: NT  M-CTSIB  Condition 1: Firm Surface, EO 30 Sec, Normal Sway  Condition 2: Firm Surface, EC 30 Sec, Mild Sway  Condition 3: Foam Surface, EO 30 Sec, Mild Sway  Condition 4: Foam Surface, EC 6 Sec, Severe Sway     VESTIBULAR TREATMENT:                                                                                                   DATE:   Canalith Repositioning:  Comment: unable to fully determine to attempt Gaze Adaptation:  N/A Habituation:  Brandt-Daroff: comment: symptoms with right vs left and Repeated Rolling: number of reps: 5 Other:   PATIENT EDUCATION: Education details: assessment findings and  rationale Person educated: Patient and Spouse Education method: Explanation, Demonstration, and Handouts Education comprehension: needs further education  HOME EXERCISE PROGRAM: Access Code: [email or web address] URL: [email or web address] Date: 08/18/2022 Prepared by: Sherlyn Lees  Exercises - Brandt-Daroff Vestibular Exercise  - 1 x daily - 7 x weekly - 1-3 sets - 5 reps - Supine to  Right Sidelying Vestibular Habituation  - 1 x daily - 7 x weekly - 1-3 sets - 5 reps  GOALS: Goals reviewed with patient? Yes  SHORT TERM GOALS: Target date: 09/08/2022    Patient will be independent in HEP to improve functional outcomes Baseline: Goal status: MET   2.  Patient will be free of positional vertigo to improve quality of life and reduce unsteadiness and risk for falls Baseline: symptoms with arising from supine and right rolling; negative 09/27/22 Goal status: MET 09/27/22     LONG TERM GOALS: Target date: 11/08/2022    Demo improved vestibular function and safety in low light conditions per mild sway x 30 sec condition 4 M-CTSIB Baseline: 6 seconds, severe; 13 sec with mod-severe sway 09/27/22 Goal status: IN PROGRESS 09/27/22  2.  Manifest improved balance and low risk for falls per score 25/30 Functional Gait Assessment to improve safety with household and community mobility Baseline: 17/30 09/27/22 Goal status: IN PROGRESS 09/27/22  3. Patient to report tolerance for 10-15 minute walk without rest break with LRAD as needed for safety.  Baseline: tolerating 5 min  Goal status: IN PROGRESS  4.  Patient to report tolerance for cooking a meal without husband's help and without rest break required d/t fatigue or imbalance.   Baseline: tolerating 5 min  Goal status: IN PROGRESS       ASSESSMENT:  CLINICAL IMPRESSION: Patient arrived to session with report of some remaining fatigue after getting COVID vaccine Monday. Continued working on increasing standing tolerance today by focusing on interval training. Patient was able to complete up to 4.5 minutes of standing activity without rest break today. Slightly less than last session, likely d/t increased fatigue levels mentioned at start of session. Required intermittent cueing for form and pacing with balance and strengthening activities. Patient without complaints at end of session.    OBJECTIVE IMPAIRMENTS: Abnormal  gait, decreased activity tolerance, decreased balance, decreased knowledge of use of DME, difficulty walking, and dizziness.   ACTIVITY LIMITATIONS: carrying, lifting, bending, standing, stairs, bathing, reach over head, and locomotion level  PARTICIPATION LIMITATIONS: meal prep, laundry, driving, and community activity  PERSONAL FACTORS: Time since onset of injury/illness/exacerbation and 1 comorbidity: COVID, HTN  are also affecting patient's functional outcome.   REHAB POTENTIAL: Good  CLINICAL DECISION MAKING: Stable/uncomplicated  EVALUATION COMPLEXITY: Low   PLAN:  PT FREQUENCY: 2x/week  PT DURATION: 6 weeks  PLANNED INTERVENTIONS: Therapeutic exercises, Therapeutic activity, Neuromuscular re-education, Balance training, Gait training, Patient/Family education, Self Care, Joint mobilization, Stair training, Vestibular training, Canalith repositioning, DME instructions, Aquatic Therapy, Dry Needling, Electrical stimulation, Spinal mobilization, Cryotherapy, Moist heat, Taping, and Manual therapy  PLAN FOR NEXT SESSION: monitor O2 and HR with activity; work on progressing standing/walking endurance, balance challenges: EC/foam, walking EC, walking backwards, narrow BOS, stepping over obstacles    Janene Harvey, PT, DPT 10/04/22 1:16 PM  [facility] at [facility] [address], [place],  [address] Phone # [phone] Fax # [phone]

## 2022-10-04 ENCOUNTER — Ambulatory Visit: Payer: Medicare Other | Admitting: Physical Therapy

## 2022-10-04 ENCOUNTER — Encounter: Payer: Self-pay | Admitting: Physical Therapy

## 2022-10-04 ENCOUNTER — Ambulatory Visit (HOSPITAL_COMMUNITY): Payer: Medicare Other | Attending: Cardiovascular Disease

## 2022-10-04 DIAGNOSIS — R2681 Unsteadiness on feet: Secondary | ICD-10-CM

## 2022-10-04 DIAGNOSIS — I2510 Atherosclerotic heart disease of native coronary artery without angina pectoris: Secondary | ICD-10-CM

## 2022-10-04 DIAGNOSIS — R262 Difficulty in walking, not elsewhere classified: Secondary | ICD-10-CM

## 2022-10-04 DIAGNOSIS — R000 Tachycardia, unspecified: Secondary | ICD-10-CM

## 2022-10-04 DIAGNOSIS — R42 Dizziness and giddiness: Secondary | ICD-10-CM

## 2022-10-04 DIAGNOSIS — M6281 Muscle weakness (generalized): Secondary | ICD-10-CM

## 2022-10-04 LAB — ECHOCARDIOGRAM COMPLETE
Area-P 1/2: 5.09 cm2
S' Lateral: 2.3 cm

## 2022-10-09 ENCOUNTER — Ambulatory Visit: Payer: Medicare Other | Admitting: Physical Therapy

## 2022-10-09 NOTE — Therapy (Incomplete)
OUTPATIENT PHYSICAL THERAPY VESTIBULAR NOTE     Patient Name: Tracy Young MRN: [id number] DOB:1946-09-09, 77 y.o., female Today's Date: 10/04/2022     END OF SESSION:  PT End of Session - 10/04/22 1313     Visit Number 11    Number of Visits 21    Date for PT Re-Evaluation 11/08/22    Authorization Type Medicare A & B    Progress Note Due on Visit 73    PT Start Time 1231    PT Stop Time 1313    PT Time Calculation (min) 42 min    Equipment Utilized During Treatment Gait belt    Activity Tolerance Patient tolerated treatment well;Patient limited by fatigue   fatigue, SOB   Behavior During Therapy WFL for tasks assessed/performed                       Past Medical History:  Diagnosis Date   Chronic kidney disease, stage 3a ([place]) 06/27/2021   COVID-19    GERD without esophagitis 06/27/2021   Hypertension    Mild intermittent asthma without complication 78/29/5621   Mixed hyperlipidemia 06/27/2021   Pneumonia    Past Surgical History:  Procedure Laterality Date   TOTAL KNEE ARTHROPLASTY     Patient Active Problem List   Diagnosis Date Noted   COVID-19 virus infection 06/27/2021   Acute respiratory failure with hypoxia ([place]) 06/27/2021   Chronic kidney disease, stage 3a ([place]) 06/27/2021   GERD without esophagitis 06/27/2021   Mixed hyperlipidemia 06/27/2021   Mild intermittent asthma with (acute) exacerbation 06/27/2021   Sepsis due to COVID-19 ([place]) 06/27/2021    PCP: Maury Dus, MD REFERRING PROVIDER: Maury Dus, MD  REFERRING DIAG: R42 (ICD-10-CM) - Dizziness and giddiness  THERAPY DIAG:  Unsteadiness on feet  Difficulty in walking, not elsewhere classified  Dizziness and giddiness  Muscle weakness (generalized)  ONSET DATE: July-August 2023  Rationale for Evaluation and Treatment: Rehabilitation  SUBJECTIVE:   SUBJECTIVE STATEMENT: ***Got COVID shots after session on Monday and feeling tired the next day. Still note  quite back to normal today. Getting an echo this afternoon.   Pt accompanied by: significant other  PERTINENT HISTORY: Patient presents for assessment since fall at home yesterday. Patient has had intermittent episodes of dizziness and lightheadedness since having COVID and also being on amlodipine. Patient denies any syncope however she did hit her head. With intermittent dizziness today she wanted to get evaluated. Patient is on aspirin. No neurologic signs or symptoms. Patient had mild injury to the right arm no pain with movement however skin abrasion and put a Band-Aid on.  PAIN:  Are you having pain? No   PRECAUTIONS: Fall Per husband- "okay with numbers (vitals) going down as long as she recovers within 2 minutes."   WEIGHT BEARING RESTRICTIONS: No  FALLS: Has patient fallen in last 6 months? Yes. Number of falls 6-7  LIVING ENVIRONMENT: Lives with: lives with their spouse Lives in: House/apartment Stairs: Yes: Internal: does not use steps; bilateral but cannot reach both and External: 3-4 steps; on right going up Has following equipment at home: Walker - 4 wheeled, uses rollator at night  PLOF: Independent  PATIENT GOALS: eliminate symptoms  OBJECTIVE:     TODAY'S TREATMENT: 10/09/2022 Activity Comments                      TODAY'S TREATMENT: 10/04/22 Activity Comments  Vitals at start of session  95% spO2,  102 bpm   TM walking 1.5 mph 2.5 min x 2 Cueing for larger steps; B UE support on TM rail   After 2.5 min: 90% spO2, 129 bpm Standing rest break ~ 75mn After last 2.5 min: 89% spO2,  128 bpm  Performed at counter top in 3 min without rest break: sidestepping over hurdle 10x mini squat 10x 86% spO2, 135 bpm after activity; allowed vitals to normalize before proceeding    Performed at counter top in 4.5 min without rest break: tandem walk 2x [place] 4x 90% spO2, 134 bpm after activity; allowed vitals to normalize before proceeding    Performed at counter  top in 3.5 min without rest break:  alt toe tap on sink cabinet 2x30" 1/2 turns to targets ~10 93% spO2, 133 bpm after activity; allowed vitals to normalize before proceeding; 1 LOB posteriorly requiring mod A to correct     Sitting rest breaks were spent educating on purse lip breathing, providing correction of form with activities, and discussing patient's current symptoms and vitals           HOME EXERCISE PROGRAM Last updated: 10/02/22 [facility]: [facility]: [email or web address] Date: 09/20/2022 Prepared by: [facility] Notes walk 2-3 minutes in the house; monitor vitals and stop if spO2 130 bpm or if other symptoms occur. perform this 3x/day.   Exercises - Narrow Stance with Counter Support  - 1 x daily - 5 x weekly - 2-3 sets - 30 sec hold - Romberg Stance with Eyes Closed  - 1 x daily - 5 x weekly - 2-3 sets - 30 sec hold - Corner Balance Feet Apart: Eyes Closed With Head Turns  - 1 x daily - 5 x weekly - 2-3 sets - 30 sec hold - Wide Stance with Eyes Closed and Head Nods  - 1 x daily - 5 x weekly - 2-3 sets - 30 sec hold - Side Step Overs with Cones and Counter Support  - 1 x daily - 5 x weekly - 2 sets - 10 reps - Figure-8 Walking Around Cones  - 1 x daily - 5 x weekly - 2 sets - 10 reps - Standing Toe Taps  - 1 x daily - 5 x weekly - 3 sets - 30 sec hold    Below measures were taken at time of initial evaluation unless otherwise specified:   DIAGNOSTIC FINDINGS: IMPRESSION: 1. No evidence of acute abnormality. 2. High posterior left scalp contusion without acute calvarial fracture.  COGNITION: Overall cognitive status: Within functional limits for tasks assessed   SENSATION: WFL  EDEMA:    MUSCLE TONE:    DTRs:    POSTURE:  No Significant postural limitations  Cervical ROM:    Active A/PROM (deg) eval  Flexion WNL  Extension WNL  Right lateral flexion WNL  Left lateral flexion  WNL  Right rotation WNL  Left rotation WNL  (Blank rows = not tested)  Looking up brings on symptoms  STRENGTH:   LOWER EXTREMITY MMT:   MMT Right eval Left eval  Hip flexion    Hip abduction    Hip adduction    Hip internal rotation    Hip external rotation    Knee flexion    Knee extension    Ankle dorsiflexion    Ankle plantarflexion    Ankle inversion    Ankle eversion    (Blank rows = not tested)  BED MOBILITY:  independent  TRANSFERS: Assistive device utilized: None  Sit to stand: Complete Independence Stand to sit: Complete Independence Chair to chair: Complete Independence Floor:  DNT  RAMP:   CURB:   GAIT: Gait pattern: decreased stride length Distance walked:  Assistive device utilized: None Level of assistance: Complete Independence Comments: ambulates with short stride/increased double limb support time, somewhat tentative in her confidence for gait  FUNCTIONAL TESTS:    PATIENT SURVEYS:    VESTIBULAR ASSESSMENT:  GENERAL OBSERVATION: wears bifocals   SYMPTOM BEHAVIOR:  Subjective history: hx of long-COVID and notes pervasive symptoms ever since  Non-Vestibular symptoms:  none  Type of dizziness: Spinning/Vertigo and Unsteady with head/body turns  Frequency: position change  Duration: 3-7 sec  Aggravating factors: Induced by position change: lying supine and supine to sit and Worse in the dark  Relieving factors: closing eyes and slow movements  Progression of symptoms: unchanged  OCULOMOTOR EXAM:  Ocular Alignment: normal  Ocular ROM: No Limitations  Spontaneous Nystagmus: absent  Gaze-Induced Nystagmus: absent  Smooth Pursuits: intact  Saccades: hypometric/undershoots  Convergence/Divergence: 4 cm     VESTIBULAR - OCULAR REFLEX:   Slow VOR: Normal  VOR Cancellation: Normal  Head-Impulse Test: HIT Right: negative HIT Left: negative  Dynamic Visual Acuity: Not able to be assessed   POSITIONAL TESTING: Right  Dix-Hallpike: no nystagmus and experiences spinning Left Dix-Hallpike: no nystagmus and minimal symptoms as compared to right Right Roll Test: no nystagmus and feels spinning 3-7 sec Left Roll Test: no nystagmus and less as compared to right  When transitioning Supine to sit: around 25-35 degrees elevation she feels most dramatic spinning sensation  MOTION SENSITIVITY:  Motion Sensitivity Quotient Intensity: 0 = none, 1 = Lightheaded, 2 = Mild, 3 = Moderate, 4 = Severe, 5 = Vomiting  Intensity  1. Sitting to supine   2. Supine to L side   3. Supine to R side   4. Supine to sitting   5. L Hallpike-Dix   6. Up from L    7. R Hallpike-Dix   8. Up from R    9. Sitting, head tipped to L knee   10. Head up from L knee   11. Sitting, head tipped to R knee   12. Head up from R knee   13. Sitting head turns x5   14.Sitting head nods x5   15. In stance, 180 turn to L    16. In stance, 180 turn to R     OTHOSTATICS: not done  FUNCTIONAL GAIT: Functional gait assessment: NT  M-CTSIB  Condition 1: Firm Surface, EO 30 Sec, Normal Sway  Condition 2: Firm Surface, EC 30 Sec, Mild Sway  Condition 3: Foam Surface, EO 30 Sec, Mild Sway  Condition 4: Foam Surface, EC 6 Sec, Severe Sway     VESTIBULAR TREATMENT:                                                                                                   DATE:   Canalith Repositioning:  Comment: unable to fully determine to attempt Gaze Adaptation:  N/A Habituation:  Brandt-Daroff: comment: symptoms with right vs left and Repeated Rolling: number of reps: 5 Other:   PATIENT EDUCATION: Education details: assessment findings and rationale Person educated: Patient and Spouse Education method: Explanation, Demonstration, and Handouts Education comprehension: needs further education  HOME EXERCISE PROGRAM: Access Code: [email or web address] URL: [email or web address] Date: 08/18/2022 Prepared by: Sherlyn Lees  Exercises -  Brandt-Daroff Vestibular Exercise  - 1 x daily - 7 x weekly - 1-3 sets - 5 reps - Supine to Right Sidelying Vestibular Habituation  - 1 x daily - 7 x weekly - 1-3 sets - 5 reps  GOALS: Goals reviewed with patient? Yes  SHORT TERM GOALS: Target date: 09/08/2022    Patient will be independent in HEP to improve functional outcomes Baseline: Goal status: MET   2.  Patient will be free of positional vertigo to improve quality of life and reduce unsteadiness and risk for falls Baseline: symptoms with arising from supine and right rolling; negative 09/27/22 Goal status: MET 09/27/22     LONG TERM GOALS: Target date: 11/08/2022    Demo improved vestibular function and safety in low light conditions per mild sway x 30 sec condition 4 M-CTSIB Baseline: 6 seconds, severe; 13 sec with mod-severe sway 09/27/22 Goal status: IN PROGRESS 09/27/22  2.  Manifest improved balance and low risk for falls per score 25/30 Functional Gait Assessment to improve safety with household and community mobility Baseline: 17/30 09/27/22 Goal status: IN PROGRESS 09/27/22  3. Patient to report tolerance for 10-15 minute walk without rest break with LRAD as needed for safety.  Baseline: tolerating 5 min  Goal status: IN PROGRESS  4.  Patient to report tolerance for cooking a meal without husband's help and without rest break required d/t fatigue or imbalance.   Baseline: tolerating 5 min  Goal status: IN PROGRESS       ASSESSMENT:  CLINICAL IMPRESSION: ***Patient arrived to session with report of some remaining fatigue after getting COVID vaccine Monday. Continued working on increasing standing tolerance today by focusing on interval training. Patient was able to complete up to 4.5 minutes of standing activity without rest break today. Slightly less than last session, likely d/t increased fatigue levels mentioned at start of session. Required intermittent cueing for form and pacing with balance and strengthening  activities. Patient without complaints at end of session.    OBJECTIVE IMPAIRMENTS: Abnormal gait, decreased activity tolerance, decreased balance, decreased knowledge of use of DME, difficulty walking, and dizziness.   ACTIVITY LIMITATIONS: carrying, lifting, bending, standing, stairs, bathing, reach over head, and locomotion level  PARTICIPATION LIMITATIONS: meal prep, laundry, driving, and community activity  PERSONAL FACTORS: Time since onset of injury/illness/exacerbation and 1 comorbidity: COVID, HTN  are also affecting patient's functional outcome.   REHAB POTENTIAL: Good  CLINICAL DECISION MAKING: Stable/uncomplicated  EVALUATION COMPLEXITY: Low   PLAN:  PT FREQUENCY: 2x/week  PT DURATION: 6 weeks  PLANNED INTERVENTIONS: Therapeutic exercises, Therapeutic activity, Neuromuscular re-education, Balance training, Gait training, Patient/Family education, Self Care, Joint mobilization, Stair training, Vestibular training, Canalith repositioning, DME instructions, Aquatic Therapy, Dry Needling, Electrical stimulation, Spinal mobilization, Cryotherapy, Moist heat, Taping, and Manual therapy  PLAN FOR NEXT SESSION: ***monitor O2 and HR with activity; work on progressing standing/walking endurance, balance challenges: EC/foam, walking EC, walking backwards, narrow BOS, stepping over obstacles    Mady Haagensen, PT 10/09/22 11:36 AM Phone: [phone] Fax: [place] [facility] [address] [place], [place] [address] Phone # [phone] Fax # [phone] 

## 2022-10-11 ENCOUNTER — Ambulatory Visit: Payer: Medicare Other | Admitting: Physical Therapy

## 2022-10-13 NOTE — Therapy (Signed)
OUTPATIENT PHYSICAL THERAPY VESTIBULAR NOTE     Patient Name: Tracy Young MRN: [id number] DOB:30-Mar-1946, 77 y.o., female Today's Date: 10/16/2022     END OF SESSION:  PT End of Session - 10/16/22 1614     Visit Number 12    Number of Visits 21    Date for PT Re-Evaluation 11/08/22    Authorization Type Medicare A & B    Progress Note Due on Visit 16    PT Start Time 8413    PT Stop Time 2440    PT Time Calculation (min) 40 min    Equipment Utilized During Treatment Gait belt    Activity Tolerance Patient tolerated treatment well;Patient limited by fatigue   fatigue, SOB   Behavior During Therapy WFL for tasks assessed/performed                        Past Medical History:  Diagnosis Date   Chronic kidney disease, stage 3a ([place]) 06/27/2021   COVID-19    GERD without esophagitis 06/27/2021   Hypertension    Mild intermittent asthma without complication 07/15/2535   Mixed hyperlipidemia 06/27/2021   Pneumonia    Past Surgical History:  Procedure Laterality Date   TOTAL KNEE ARTHROPLASTY     Patient Active Problem List   Diagnosis Date Noted   COVID-19 virus infection 06/27/2021   Acute respiratory failure with hypoxia ([place]) 06/27/2021   Chronic kidney disease, stage 3a ([place]) 06/27/2021   GERD without esophagitis 06/27/2021   Mixed hyperlipidemia 06/27/2021   Mild intermittent asthma with (acute) exacerbation 06/27/2021   Sepsis due to COVID-19 ([place]) 06/27/2021    PCP: Maury Dus, MD REFERRING PROVIDER: Maury Dus, MD  REFERRING DIAG: R42 (ICD-10-CM) - Dizziness and giddiness  THERAPY DIAG:  Unsteadiness on feet  Difficulty in walking, not elsewhere classified  Dizziness and giddiness  Muscle weakness (generalized)  ONSET DATE: July-August 2023  Rationale for Evaluation and Treatment: Rehabilitation  SUBJECTIVE:   SUBJECTIVE STATEMENT: Had a lot of activities out and about from Thursday-Sunday last week. Did not hurt it  but once whe she got up from sitting she could not put weight on it. Saw the Dr. who told her that she walks with her feet inverted and that there is a bone in that foot that is rubbing against her shoe. Reports that x-rays were clear- just some arthritis. Pain is better now.   Pt accompanied by: significant other  PERTINENT HISTORY: Patient presents for assessment since fall at home yesterday. Patient has had intermittent episodes of dizziness and lightheadedness since having COVID and also being on amlodipine. Patient denies any syncope however she did hit her head. With intermittent dizziness today she wanted to get evaluated. Patient is on aspirin. No neurologic signs or symptoms. Patient had mild injury to the right arm no pain with movement however skin abrasion and put a Band-Aid on.  PAIN:  Are you having pain? No   PRECAUTIONS: Fall Per husband- "okay with numbers (vitals) going down as long as she recovers within 2 minutes."   WEIGHT BEARING RESTRICTIONS: No  FALLS: Has patient fallen in last 6 months? Yes. Number of falls 6-7  LIVING ENVIRONMENT: Lives with: lives with their spouse Lives in: House/apartment Stairs: Yes: Internal: does not use steps; bilateral but cannot reach both and External: 3-4 steps; on right going up Has following equipment at home: Walker - 4 wheeled, uses rollator at night  PLOF: Independent  PATIENT GOALS: eliminate  symptoms  OBJECTIVE:      TODAY'S TREATMENT: 10/16/22 Activity Comments  Performed at counter top in 8.5 min without rest break:  placing cones from stool to overhead shelf 7x each side standing hip ABD 10x each side standing hip EX 10x each side backwards walking 4x along counter top walking EC 4x along counter top Marching x10 1/2 turns to targets 10x Cues for pacing; CGA-min A for stability occasionally    Vitals after activity: 88 spO2, 127 bpm; allowed sitting rest break for vitals to normalize before proceeding    Performed at counter top in 3.5 min without rest break:  STS 10x tandem walk 4x along counter    Vitals after activity: 89 spO2,  131 bpm; allowed sitting rest break for vitals to normalize before proceeding   Performed at counter top in 4.5 min without rest break:  [place] 4x along counter STS with green medball at chest 10x Farmer's carry with 7# 187f vitals after activity: 93 spO2,  143 bpm; allowed sitting rest break for vitals to normalize before proceeding   Performed at counter top in 3 min without rest break:   sidestepping over cone each side Backwards step over cone, then bending forward to pick back up  vitals after activity: 89 spO2,  134 bpm; allowed sitting rest break for vitals to normalize before proceeding   CGA-min A for stability            PATIENT EDUCATION: Education details: discussed use of weights added into HEP to increase challenge with HEP and edu on finding appropriate weight to avoid over-exertion  Person educated: Patient and Spouse Education method: Explanation and Demonstration Education comprehension: verbalized understanding     HOME EXERCISE PROGRAM Last updated: 10/02/22 [email or web address] [email or web address] Date: 09/20/2022 Prepared by: [facility] Notes walk 2-3 minutes in the house; monitor vitals and stop if spO2 130 bpm or if other symptoms occur. perform this 3x/day.   Exercises - Narrow Stance with Counter Support  - 1 x daily - 5 x weekly - 2-3 sets - 30 sec hold - Romberg Stance with Eyes Closed  - 1 x daily - 5 x weekly - 2-3 sets - 30 sec hold - Corner Balance Feet Apart: Eyes Closed With Head Turns  - 1 x daily - 5 x weekly - 2-3 sets - 30 sec hold - Wide Stance with Eyes Closed and Head Nods  - 1 x daily - 5 x weekly - 2-3 sets - 30 sec hold - Side Step Overs with Cones and Counter Support  - 1 x daily - 5 x weekly - 2 sets - 10 reps - Figure-8  Walking Around Cones  - 1 x daily - 5 x weekly - 2 sets - 10 reps - Standing Toe Taps  - 1 x daily - 5 x weekly - 3 sets - 30 sec hold    Below measures were taken at time of initial evaluation unless otherwise specified:   DIAGNOSTIC FINDINGS: IMPRESSION: 1. No evidence of acute abnormality. 2. High posterior left scalp contusion without acute calvarial fracture.  COGNITION: Overall cognitive status: Within functional limits for tasks assessed   SENSATION: WFL  EDEMA:    MUSCLE TONE:    DTRs:    POSTURE:  No Significant postural limitations  Cervical ROM:    Active A/PROM (deg) eval  Flexion WNL  Extension WNL  Right lateral flexion WNL  Left lateral  flexion WNL  Right rotation WNL  Left rotation WNL  (Blank rows = not tested)  Looking up brings on symptoms  STRENGTH:   LOWER EXTREMITY MMT:   MMT Right eval Left eval  Hip flexion    Hip abduction    Hip adduction    Hip internal rotation    Hip external rotation    Knee flexion    Knee extension    Ankle dorsiflexion    Ankle plantarflexion    Ankle inversion    Ankle eversion    (Blank rows = not tested)  BED MOBILITY:  independent  TRANSFERS: Assistive device utilized: None  Sit to stand: Complete Independence Stand to sit: Complete Independence Chair to chair: Complete Independence Floor:  DNT  RAMP:   CURB:   GAIT: Gait pattern: decreased stride length Distance walked:  Assistive device utilized: None Level of assistance: Complete Independence Comments: ambulates with short stride/increased double limb support time, somewhat tentative in her confidence for gait  FUNCTIONAL TESTS:    PATIENT SURVEYS:    VESTIBULAR ASSESSMENT:  GENERAL OBSERVATION: wears bifocals   SYMPTOM BEHAVIOR:  Subjective history: hx of long-COVID and notes pervasive symptoms ever since  Non-Vestibular symptoms:  none  Type of dizziness: Spinning/Vertigo and Unsteady with head/body  turns  Frequency: position change  Duration: 3-7 sec  Aggravating factors: Induced by position change: lying supine and supine to sit and Worse in the dark  Relieving factors: closing eyes and slow movements  Progression of symptoms: unchanged  OCULOMOTOR EXAM:  Ocular Alignment: normal  Ocular ROM: No Limitations  Spontaneous Nystagmus: absent  Gaze-Induced Nystagmus: absent  Smooth Pursuits: intact  Saccades: hypometric/undershoots  Convergence/Divergence: 4 cm     VESTIBULAR - OCULAR REFLEX:   Slow VOR: Normal  VOR Cancellation: Normal  Head-Impulse Test: HIT Right: negative HIT Left: negative  Dynamic Visual Acuity: Not able to be assessed   POSITIONAL TESTING: Right Dix-Hallpike: no nystagmus and experiences spinning Left Dix-Hallpike: no nystagmus and minimal symptoms as compared to right Right Roll Test: no nystagmus and feels spinning 3-7 sec Left Roll Test: no nystagmus and less as compared to right  When transitioning Supine to sit: around 25-35 degrees elevation she feels most dramatic spinning sensation  MOTION SENSITIVITY:  Motion Sensitivity Quotient Intensity: 0 = none, 1 = Lightheaded, 2 = Mild, 3 = Moderate, 4 = Severe, 5 = Vomiting  Intensity  1. Sitting to supine   2. Supine to L side   3. Supine to R side   4. Supine to sitting   5. L Hallpike-Dix   6. Up from L    7. R Hallpike-Dix   8. Up from R    9. Sitting, head tipped to L knee   10. Head up from L knee   11. Sitting, head tipped to R knee   12. Head up from R knee   13. Sitting head turns x5   14.Sitting head nods x5   15. In stance, 180 turn to L    16. In stance, 180 turn to R     OTHOSTATICS: not done  FUNCTIONAL GAIT: Functional gait assessment: NT  M-CTSIB  Condition 1: Firm Surface, EO 30 Sec, Normal Sway  Condition 2: Firm Surface, EC 30 Sec, Mild Sway  Condition 3: Foam Surface, EO 30 Sec, Mild Sway  Condition 4: Foam Surface, EC 6 Sec, Severe Sway     VESTIBULAR  TREATMENT:  DATE:   Canalith Repositioning:  Comment: unable to fully determine to attempt Gaze Adaptation:  N/A Habituation:  Brandt-Daroff: comment: symptoms with right vs left and Repeated Rolling: number of reps: 5 Other:   PATIENT EDUCATION: Education details: assessment findings and rationale Person educated: Patient and Spouse Education method: Explanation, Demonstration, and Handouts Education comprehension: needs further education  HOME EXERCISE PROGRAM: Access Code: [email or web address] URL: [email or web address] Date: 08/18/2022 Prepared by: Sherlyn Lees  Exercises - Brandt-Daroff Vestibular Exercise  - 1 x daily - 7 x weekly - 1-3 sets - 5 reps - Supine to Right Sidelying Vestibular Habituation  - 1 x daily - 7 x weekly - 1-3 sets - 5 reps  GOALS: Goals reviewed with patient? Yes  SHORT TERM GOALS: Target date: 09/08/2022    Patient will be independent in HEP to improve functional outcomes Baseline: Goal status: MET   2.  Patient will be free of positional vertigo to improve quality of life and reduce unsteadiness and risk for falls Baseline: symptoms with arising from supine and right rolling; negative 09/27/22 Goal status: MET 09/27/22     LONG TERM GOALS: Target date: 11/08/2022    Demo improved vestibular function and safety in low light conditions per mild sway x 30 sec condition 4 M-CTSIB Baseline: 6 seconds, severe; 13 sec with mod-severe sway 09/27/22 Goal status: IN PROGRESS 09/27/22  2.  Manifest improved balance and low risk for falls per score 25/30 Functional Gait Assessment to improve safety with household and community mobility Baseline: 17/30 09/27/22 Goal status: IN PROGRESS 09/27/22  3. Patient to report tolerance for 10-15 minute walk without rest break with LRAD as needed for safety.  Baseline: tolerating 5 min  Goal status: IN  PROGRESS  4.  Patient to report tolerance for cooking a meal without husband's help and without rest break required d/t fatigue or imbalance.   Baseline: tolerating 5 min  Goal status: IN PROGRESS       ASSESSMENT:  CLINICAL IMPRESSION: Patient arrived to session with report of spontaneous onset of L foot pain since last session- reports that she was seen by an orthopedist who did x-rays. Patient reports that these were clear (however unable to see these in chart). Patient tolerated session without c/o foot pain throughout however slight antalgia evident. Continued working on dynamic balance and LE strengthening activities in intervals. Patient was able to tolerate up to 8.5 minutes of uninterrupted activity today before requiring rest break. Vitals were monitored throughout session to ensure appropriate response to exercise. Patient reported understanding of all edu provided and without complaints at end of session.     OBJECTIVE IMPAIRMENTS: Abnormal gait, decreased activity tolerance, decreased balance, decreased knowledge of use of DME, difficulty walking, and dizziness.   ACTIVITY LIMITATIONS: carrying, lifting, bending, standing, stairs, bathing, reach over head, and locomotion level  PARTICIPATION LIMITATIONS: meal prep, laundry, driving, and community activity  PERSONAL FACTORS: Time since onset of injury/illness/exacerbation and 1 comorbidity: COVID, HTN  are also affecting patient's functional outcome.   REHAB POTENTIAL: Good  CLINICAL DECISION MAKING: Stable/uncomplicated  EVALUATION COMPLEXITY: Low   PLAN:  PT FREQUENCY: 2x/week  PT DURATION: 6 weeks  PLANNED INTERVENTIONS: Therapeutic exercises, Therapeutic activity, Neuromuscular re-education, Balance training, Gait training, Patient/Family education, Self Care, Joint mobilization, Stair training, Vestibular training, Canalith repositioning, DME instructions, Aquatic Therapy, Dry Needling, Electrical stimulation,  Spinal mobilization, Cryotherapy, Moist heat, Taping, and Manual therapy  PLAN FOR NEXT SESSION: monitor O2 and HR with activity; work on progressing standing/walking endurance,  balance challenges: EC/foam, walking EC, walking backwards, narrow BOS, stepping over obstacles    Janene Harvey, PT, DPT 10/16/22 4:16 PM  [facility] [address], [place], [place] [address] Phone # [phone] Fax # [phone]

## 2022-10-16 ENCOUNTER — Ambulatory Visit: Payer: Medicare Other | Admitting: Physical Therapy

## 2022-10-16 ENCOUNTER — Encounter: Payer: Self-pay | Admitting: Physical Therapy

## 2022-10-16 DIAGNOSIS — R262 Difficulty in walking, not elsewhere classified: Secondary | ICD-10-CM

## 2022-10-16 DIAGNOSIS — R42 Dizziness and giddiness: Secondary | ICD-10-CM

## 2022-10-16 DIAGNOSIS — R2681 Unsteadiness on feet: Secondary | ICD-10-CM

## 2022-10-16 DIAGNOSIS — M6281 Muscle weakness (generalized): Secondary | ICD-10-CM

## 2022-10-17 IMAGING — MG MM DIGITAL SCREENING BILAT W/ TOMO AND CAD
6 of 10 series · 6 of 30 positions shown · non-contrast
Comparison: Previous exam(s).

CLINICAL DATA: Screening.

EXAM:
DIGITAL SCREENING BILATERAL MAMMOGRAM WITH TOMOSYNTHESIS AND CAD
TECHNIQUE: Bilateral screening digital craniocaudal and mediolateral oblique
mammograms were obtained. Bilateral screening digital breast
tomosynthesis was performed. The images were evaluated with
computer-aided detection.

[L CC synth-2D]
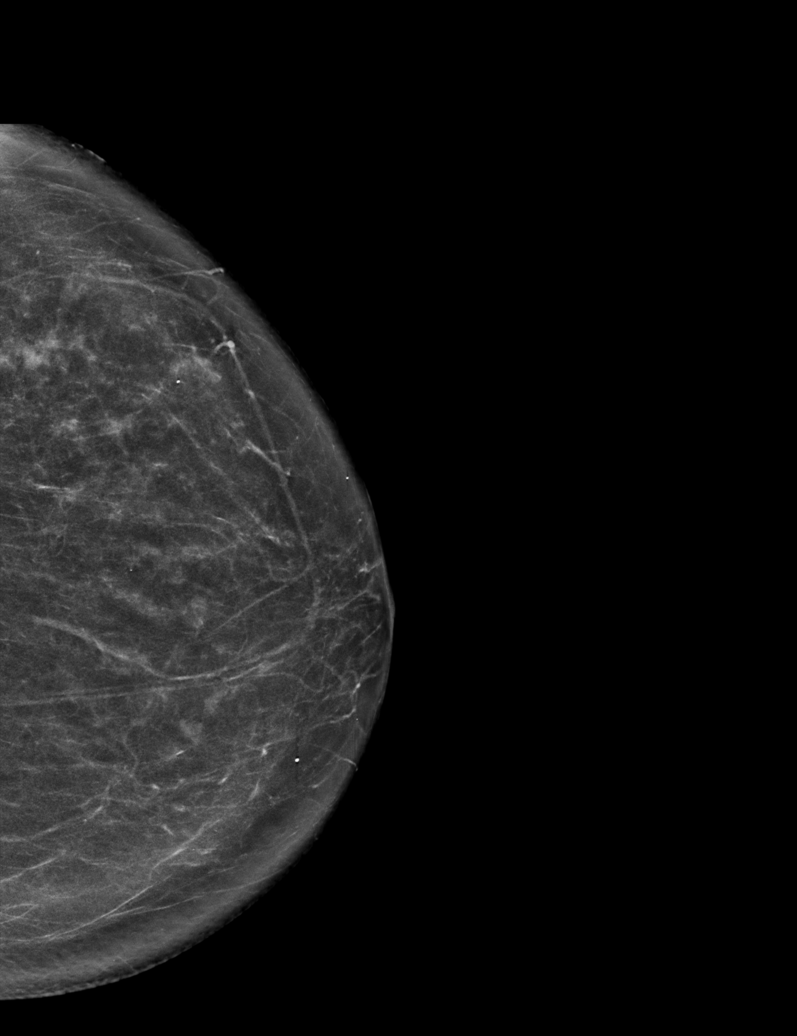

[R MLO synth-2D]
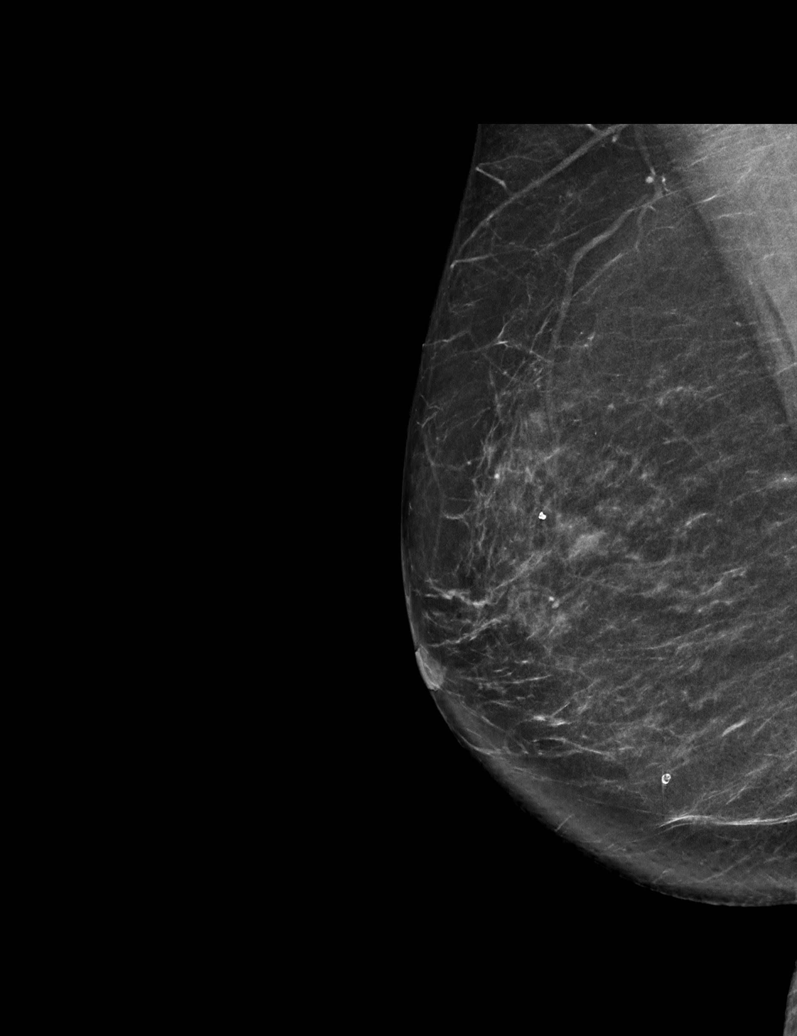

[L MLO synth-2D]
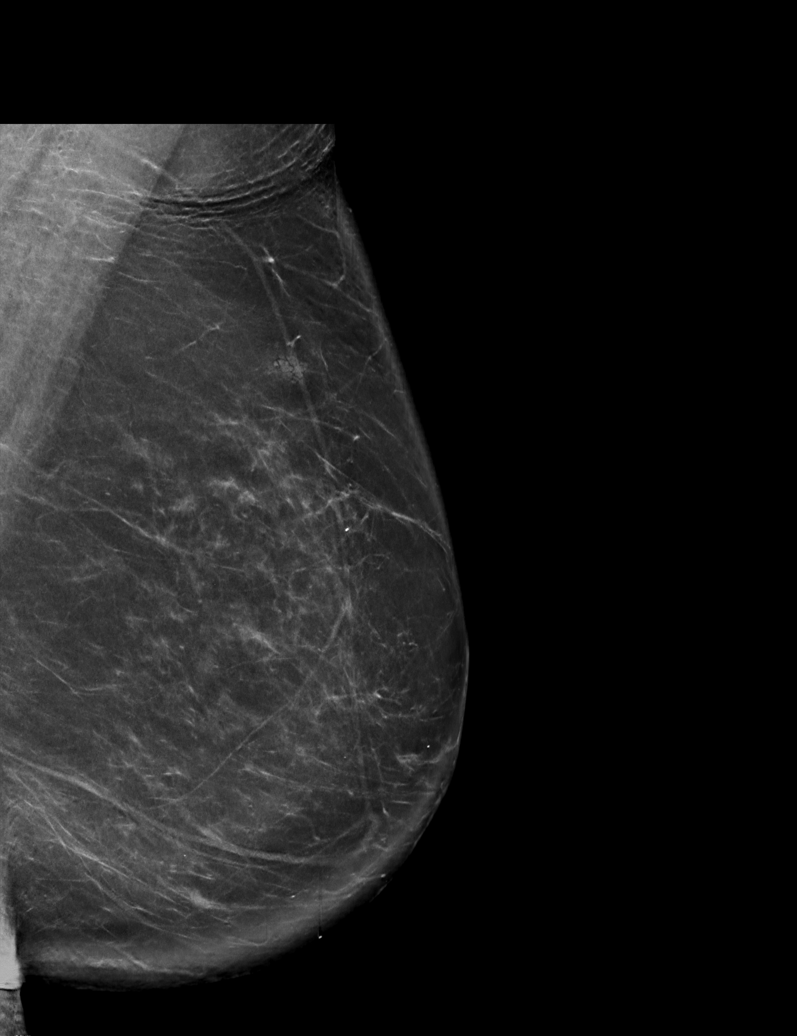

[R CC synth-2D (1 of 2)]
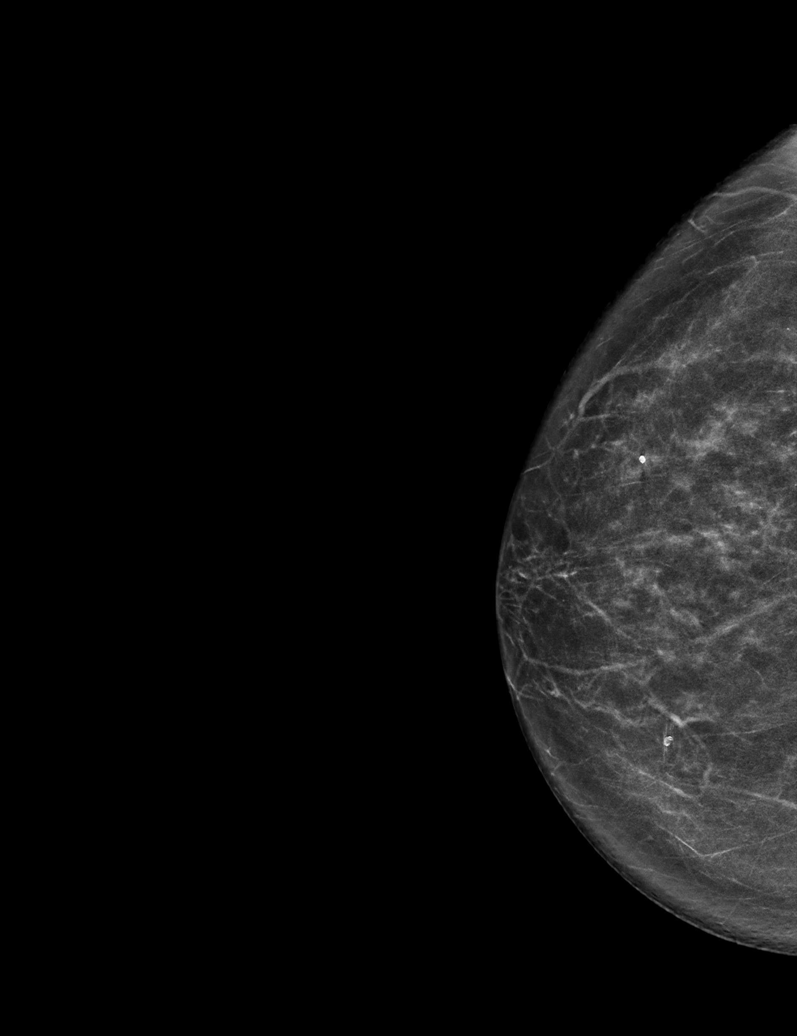

[R CC synth-2D (2 of 2)]
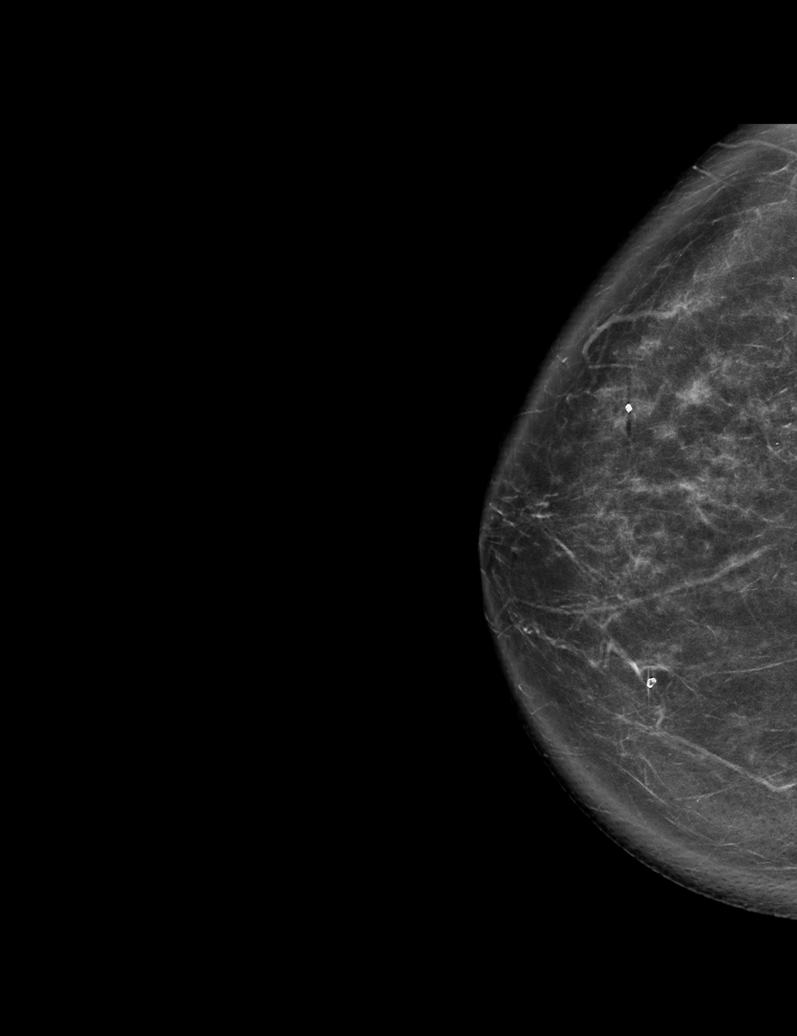

[R CC tomo · tomo slice 41/82.0]
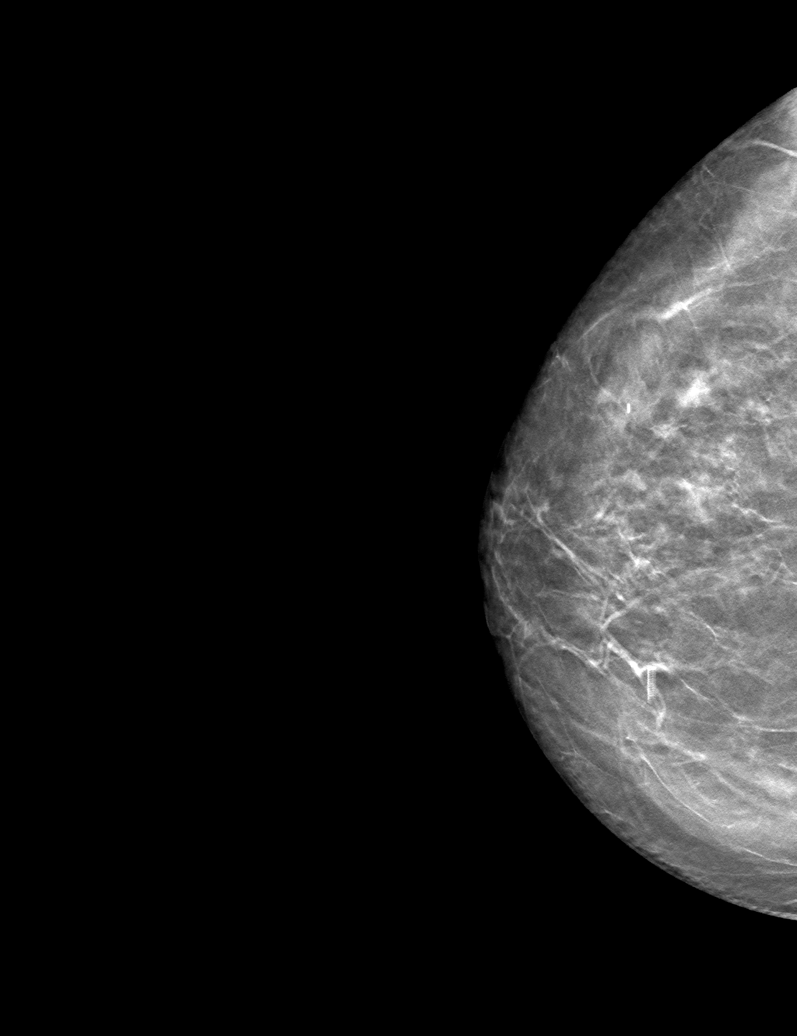

[6 of 30 positions shown; findings below may reference images not displayed]

ACR Breast Density Category b: There are scattered areas of
fibroglandular density.
FINDINGS: There are no findings suspicious for malignancy.
IMPRESSION: No mammographic evidence of malignancy. A result letter of this
screening mammogram will be mailed directly to the patient.

RECOMMENDATION:
Screening mammogram in one year. (Code:51-O-LD2)

BI-RADS CATEGORY  1: Negative.

## 2022-10-18 ENCOUNTER — Encounter: Payer: Self-pay | Admitting: Physical Therapy

## 2022-10-18 ENCOUNTER — Ambulatory Visit: Payer: Medicare Other | Admitting: Physical Therapy

## 2022-10-18 DIAGNOSIS — R42 Dizziness and giddiness: Secondary | ICD-10-CM | POA: Diagnosis not present

## 2022-10-18 DIAGNOSIS — R262 Difficulty in walking, not elsewhere classified: Secondary | ICD-10-CM

## 2022-10-18 DIAGNOSIS — R2681 Unsteadiness on feet: Secondary | ICD-10-CM

## 2022-10-18 DIAGNOSIS — M6281 Muscle weakness (generalized): Secondary | ICD-10-CM

## 2022-10-18 NOTE — Therapy (Signed)
OUTPATIENT PHYSICAL THERAPY VESTIBULAR NOTE     Patient Name: Tracy Young MRN: [id number] DOB:10-18-1945, 77 y.o., female Today's Date: 10/18/2022     END OF SESSION:  PT End of Session - 10/18/22 1300     Visit Number 13    Number of Visits 21    Date for PT Re-Evaluation 11/08/22    Authorization Type Medicare A & B    Progress Note Due on Visit 46    PT Start Time 1620    PT Stop Time 1702    PT Time Calculation (min) 42 min    Equipment Utilized During Treatment Gait belt    Activity Tolerance Patient tolerated treatment well;Patient limited by fatigue   fatigue, SOB   Behavior During Therapy WFL for tasks assessed/performed                        Past Medical History:  Diagnosis Date   Chronic kidney disease, stage 3a ([place]) 06/27/2021   COVID-19    GERD without esophagitis 06/27/2021   Hypertension    Mild intermittent asthma without complication 24/82/5003   Mixed hyperlipidemia 06/27/2021   Pneumonia    Past Surgical History:  Procedure Laterality Date   TOTAL KNEE ARTHROPLASTY     Patient Active Problem List   Diagnosis Date Noted   COVID-19 virus infection 06/27/2021   Acute respiratory failure with hypoxia ([place]) 06/27/2021   Chronic kidney disease, stage 3a ([place]) 06/27/2021   GERD without esophagitis 06/27/2021   Mixed hyperlipidemia 06/27/2021   Mild intermittent asthma with (acute) exacerbation 06/27/2021   Sepsis due to COVID-19 ([place]) 06/27/2021    PCP: Maury Dus, MD REFERRING PROVIDER: Maury Dus, MD  REFERRING DIAG: R42 (ICD-10-CM) - Dizziness and giddiness  THERAPY DIAG:  Unsteadiness on feet  Difficulty in walking, not elsewhere classified  Muscle weakness (generalized)  Dizziness and giddiness  ONSET DATE: July-August 2023  Rationale for Evaluation and Treatment: Rehabilitation  SUBJECTIVE:   SUBJECTIVE STATEMENT: Monday I had probably the best day in therapy so far.  Foot is better.   Pt accompanied  by: significant other  PERTINENT HISTORY: Patient presents for assessment since fall at home yesterday. Patient has had intermittent episodes of dizziness and lightheadedness since having COVID and also being on amlodipine. Patient denies any syncope however she did hit her head. With intermittent dizziness today she wanted to get evaluated. Patient is on aspirin. No neurologic signs or symptoms. Patient had mild injury to the right arm no pain with movement however skin abrasion and put a Band-Aid on.  PAIN:  Are you having pain? No   PRECAUTIONS: Fall Per husband- "okay with numbers (vitals) going down as long as she recovers within 2 minutes."   WEIGHT BEARING RESTRICTIONS: No  FALLS: Has patient fallen in last 6 months? Yes. Number of falls 6-7  LIVING ENVIRONMENT: Lives with: lives with their spouse Lives in: House/apartment Stairs: Yes: Internal: does not use steps; bilateral but cannot reach both and External: 3-4 steps; on right going up Has following equipment at home: Walker - 4 wheeled, uses rollator at night  PLOF: Independent  PATIENT GOALS: eliminate symptoms  OBJECTIVE:   Beginning vitals:  O2 sats 93%.  HR 106 bpm  TODAY'S TREATMENT: 10/18/22 Activity Comments  Performed at counter top in 7 min without seated break:  standing hip ABD 10x each side standing hip EX 10x each side Forward/backwards walking 4x along counter top Standing pursed lip breathing, 5-8  reps Marching x10  Cues for pacing; CGA-min A for stability occasionally    Vitals mid- activity: 89-90 spO2, 122 bpm; vitals after activity:  92% spO2, 131 bpm allowed sitting rest break for vitals to normalize before proceeding   Performed at counter top in 5.5 min without rest break:  Sidestepping R and L 4 x along counter minisquats 10x tandem walk 4x along counter-cues for looking ahead   Vitals after activity: 91 spO2,  130 bpm; allowed sitting rest break for vitals to normalize before proceeding    Performed at counter top in 7 min without rest break:  [place] 2x along counter STS with green medball at chest 10x Stagger stance forward/back rocking x 10 reps each foot  vitals after activity: 92 spO2,  125 bpm; allowed sitting rest break for vitals to normalize before proceeding   Performed standing for 4.5 min without rest break:  Farmer's carry, 7# 50 ft x 2 reps, with mild instability Forward marching along counter 4 reps   vitals after activity: 89 spO2,  134 bpm; allowed sitting rest break for vitals to normalize before proceeding   CGA-min A for stability            PATIENT EDUCATION: Education details: Reviewed Breathing techniques for slowed pace of breathing:  pursed lip breathing, diaphragm and chest breathing Person educated: Patient and Spouse Education method: Explanation and Demonstration Education comprehension: verbalized understanding     HOME EXERCISE PROGRAM Last updated: 10/02/22 Access Code: [facility] URL: [email or web address] Date: 09/20/2022 Prepared by: [facility]  Program Notes walk 2-3 minutes in the house; monitor vitals and stop if spO2 130 bpm or if other symptoms occur. perform this 3x/day.   Exercises - Narrow Stance with Counter Support  - 1 x daily - 5 x weekly - 2-3 sets - 30 sec hold - Romberg Stance with Eyes Closed  - 1 x daily - 5 x weekly - 2-3 sets - 30 sec hold - Corner Balance Feet Apart: Eyes Closed With Head Turns  - 1 x daily - 5 x weekly - 2-3 sets - 30 sec hold - Wide Stance with Eyes Closed and Head Nods  - 1 x daily - 5 x weekly - 2-3 sets - 30 sec hold - Side Step Overs with Cones and Counter Support  - 1 x daily - 5 x weekly - 2 sets - 10 reps - Figure-8 Walking Around Cones  - 1 x daily - 5 x weekly - 2 sets - 10 reps - Standing Toe Taps  - 1 x daily - 5 x weekly - 3 sets - 30 sec hold    Below measures were taken at time of initial evaluation unless  otherwise specified:   DIAGNOSTIC FINDINGS: IMPRESSION: 1. No evidence of acute abnormality. 2. High posterior left scalp contusion without acute calvarial fracture.  COGNITION: Overall cognitive status: Within functional limits for tasks assessed   SENSATION: WFL  EDEMA:    MUSCLE TONE:    DTRs:    POSTURE:  No Significant postural limitations  Cervical ROM:    Active A/PROM (deg) eval  Flexion WNL  Extension WNL  Right lateral flexion WNL  Left lateral flexion WNL  Right rotation WNL  Left rotation WNL  (Blank rows = not tested)  Looking up brings on symptoms  STRENGTH:   LOWER EXTREMITY MMT:   MMT Right eval Left eval  Hip flexion    Hip abduction  Hip adduction    Hip internal rotation    Hip external rotation    Knee flexion    Knee extension    Ankle dorsiflexion    Ankle plantarflexion    Ankle inversion    Ankle eversion    (Blank rows = not tested)  BED MOBILITY:  independent  TRANSFERS: Assistive device utilized: None  Sit to stand: Complete Independence Stand to sit: Complete Independence Chair to chair: Complete Independence Floor:  DNT  RAMP:   CURB:   GAIT: Gait pattern: decreased stride length Distance walked:  Assistive device utilized: None Level of assistance: Complete Independence Comments: ambulates with short stride/increased double limb support time, somewhat tentative in her confidence for gait  FUNCTIONAL TESTS:    PATIENT SURVEYS:    VESTIBULAR ASSESSMENT:  GENERAL OBSERVATION: wears bifocals   SYMPTOM BEHAVIOR:  Subjective history: hx of long-COVID and notes pervasive symptoms ever since  Non-Vestibular symptoms:  none  Type of dizziness: Spinning/Vertigo and Unsteady with head/body turns  Frequency: position change  Duration: 3-7 sec  Aggravating factors: Induced by position change: lying supine and supine to sit and Worse in the dark  Relieving factors: closing eyes and slow  movements  Progression of symptoms: unchanged  OCULOMOTOR EXAM:  Ocular Alignment: normal  Ocular ROM: No Limitations  Spontaneous Nystagmus: absent  Gaze-Induced Nystagmus: absent  Smooth Pursuits: intact  Saccades: hypometric/undershoots  Convergence/Divergence: 4 cm     VESTIBULAR - OCULAR REFLEX:   Slow VOR: Normal  VOR Cancellation: Normal  Head-Impulse Test: HIT Right: negative HIT Left: negative  Dynamic Visual Acuity: Not able to be assessed   POSITIONAL TESTING: Right Dix-Hallpike: no nystagmus and experiences spinning Left Dix-Hallpike: no nystagmus and minimal symptoms as compared to right Right Roll Test: no nystagmus and feels spinning 3-7 sec Left Roll Test: no nystagmus and less as compared to right  When transitioning Supine to sit: around 25-35 degrees elevation she feels most dramatic spinning sensation  MOTION SENSITIVITY:  Motion Sensitivity Quotient Intensity: 0 = none, 1 = Lightheaded, 2 = Mild, 3 = Moderate, 4 = Severe, 5 = Vomiting  Intensity  1. Sitting to supine   2. Supine to L side   3. Supine to R side   4. Supine to sitting   5. L Hallpike-Dix   6. Up from L    7. R Hallpike-Dix   8. Up from R    9. Sitting, head tipped to L knee   10. Head up from L knee   11. Sitting, head tipped to R knee   12. Head up from R knee   13. Sitting head turns x5   14.Sitting head nods x5   15. In stance, 180 turn to L    16. In stance, 180 turn to R     OTHOSTATICS: not done  FUNCTIONAL GAIT: Functional gait assessment: NT  M-CTSIB  Condition 1: Firm Surface, EO 30 Sec, Normal Sway  Condition 2: Firm Surface, EC 30 Sec, Mild Sway  Condition 3: Foam Surface, EO 30 Sec, Mild Sway  Condition 4: Foam Surface, EC 6 Sec, Severe Sway     VESTIBULAR TREATMENT:  DATE:   Canalith Repositioning:  Comment: unable to fully determine to attempt Gaze  Adaptation:  N/A Habituation:  Brandt-Daroff: comment: symptoms with right vs left and Repeated Rolling: number of reps: 5 Other:   PATIENT EDUCATION: Education details: assessment findings and rationale Person educated: Patient and Spouse Education method: Explanation, Demonstration, and Handouts Education comprehension: needs further education  HOME EXERCISE PROGRAM: Access Code: [email or web address] URL: [email or web address] Date: 08/18/2022 Prepared by: Sherlyn Lees  Exercises - Brandt-Daroff Vestibular Exercise  - 1 x daily - 7 x weekly - 1-3 sets - 5 reps - Supine to Right Sidelying Vestibular Habituation  - 1 x daily - 7 x weekly - 1-3 sets - 5 reps  GOALS: Goals reviewed with patient? Yes  SHORT TERM GOALS: Target date: 09/08/2022    Patient will be independent in HEP to improve functional outcomes Baseline: Goal status: MET   2.  Patient will be free of positional vertigo to improve quality of life and reduce unsteadiness and risk for falls Baseline: symptoms with arising from supine and right rolling; negative 09/27/22 Goal status: MET 09/27/22     LONG TERM GOALS: Target date: 11/08/2022    Demo improved vestibular function and safety in low light conditions per mild sway x 30 sec condition 4 M-CTSIB Baseline: 6 seconds, severe; 13 sec with mod-severe sway 09/27/22 Goal status: IN PROGRESS 09/27/22  2.  Manifest improved balance and low risk for falls per score 25/30 Functional Gait Assessment to improve safety with household and community mobility Baseline: 17/30 09/27/22 Goal status: IN PROGRESS 09/27/22  3. Patient to report tolerance for 10-15 minute walk without rest break with LRAD as needed for safety.  Baseline: tolerating 5 min  Goal status: IN PROGRESS  4.  Patient to report tolerance for cooking a meal without husband's help and without rest break required d/t fatigue or imbalance.   Baseline: tolerating 5 min  Goal status: IN PROGRESS        ASSESSMENT:  CLINICAL IMPRESSION: Skilled PT session focused on standing strengthening and dynamic balance activities in intervals. Patient was able to tolerate two bouts of 7 minutes of uninterrupted activity today before requiring rest break. She demonstrates unsteadiness with farmer's carry in open gym area, needing several brief standing breaks to reset balance before proceeding.  Vitals were monitored throughout session to ensure appropriate response to exercise. She will continue to benefit from skilled PT towards goals for improved overall improved functional mobility and decreased fall risk.  OBJECTIVE IMPAIRMENTS: Abnormal gait, decreased activity tolerance, decreased balance, decreased knowledge of use of DME, difficulty walking, and dizziness.   ACTIVITY LIMITATIONS: carrying, lifting, bending, standing, stairs, bathing, reach over head, and locomotion level  PARTICIPATION LIMITATIONS: meal prep, laundry, driving, and community activity  PERSONAL FACTORS: Time since onset of injury/illness/exacerbation and 1 comorbidity: COVID, HTN  are also affecting patient's functional outcome.   REHAB POTENTIAL: Good  CLINICAL DECISION MAKING: Stable/uncomplicated  EVALUATION COMPLEXITY: Low   PLAN:  PT FREQUENCY: 2x/week  PT DURATION: 6 weeks  PLANNED INTERVENTIONS: Therapeutic exercises, Therapeutic activity, Neuromuscular re-education, Balance training, Gait training, Patient/Family education, Self Care, Joint mobilization, Stair training, Vestibular training, Canalith repositioning, DME instructions, Aquatic Therapy, Dry Needling, Electrical stimulation, Spinal mobilization, Cryotherapy, Moist heat, Taping, and Manual therapy  PLAN FOR NEXT SESSION: monitor O2 and HR with activity; work on progressing standing/walking endurance, balance challenges: EC/foam, walking EC, walking backwards, narrow BOS, stepping over obstacles (working away from counter and out in gym area with  less support  as she is able to progress)   Mady Haagensen, PT 10/18/22 5:16 PM Phone: [phone] Fax: [place] at [facility], [address] [place], [place] [address] Phone # [phone] Fax # [phone]

## 2022-10-23 ENCOUNTER — Ambulatory Visit: Payer: Medicare Other | Attending: Family Medicine

## 2022-10-23 DIAGNOSIS — R262 Difficulty in walking, not elsewhere classified: Secondary | ICD-10-CM | POA: Insufficient documentation

## 2022-10-23 DIAGNOSIS — R2681 Unsteadiness on feet: Secondary | ICD-10-CM | POA: Insufficient documentation

## 2022-10-23 DIAGNOSIS — R42 Dizziness and giddiness: Secondary | ICD-10-CM | POA: Insufficient documentation

## 2022-10-23 DIAGNOSIS — M6281 Muscle weakness (generalized): Secondary | ICD-10-CM | POA: Insufficient documentation

## 2022-10-23 NOTE — Therapy (Unsigned)
OUTPATIENT PHYSICAL THERAPY VESTIBULAR NOTE     Patient Name: Tracy Young MRN: [id number] DOB:Jul 27, 1946, 77 y.o., female Today's Date: 10/23/2022     END OF SESSION:  PT End of Session - 10/23/22 1524     Visit Number 14    Number of Visits 21    Date for PT Re-Evaluation 11/08/22    Authorization Type Medicare A & B    Progress Note Due on Visit 38    PT Start Time 0814    PT Stop Time 4818    PT Time Calculation (min) 45 min    Equipment Utilized During Treatment Gait belt    Activity Tolerance Patient tolerated treatment well;Patient limited by fatigue   fatigue, SOB   Behavior During Therapy WFL for tasks assessed/performed                        Past Medical History:  Diagnosis Date   Chronic kidney disease, stage 3a ([place]) 06/27/2021   COVID-19    GERD without esophagitis 06/27/2021   Hypertension    Mild intermittent asthma without complication 56/31/4970   Mixed hyperlipidemia 06/27/2021   Pneumonia    Past Surgical History:  Procedure Laterality Date   TOTAL KNEE ARTHROPLASTY     Patient Active Problem List   Diagnosis Date Noted   COVID-19 virus infection 06/27/2021   Acute respiratory failure with hypoxia ([place]) 06/27/2021   Chronic kidney disease, stage 3a ([place]) 06/27/2021   GERD without esophagitis 06/27/2021   Mixed hyperlipidemia 06/27/2021   Mild intermittent asthma with (acute) exacerbation 06/27/2021   Sepsis due to COVID-19 ([place]) 06/27/2021    PCP: Maury Dus, MD REFERRING PROVIDER: Maury Dus, MD  REFERRING DIAG: R42 (ICD-10-CM) - Dizziness and giddiness  THERAPY DIAG:  Unsteadiness on feet  Difficulty in walking, not elsewhere classified  Muscle weakness (generalized)  Dizziness and giddiness  ONSET DATE: July-August 2023  Rationale for Evaluation and Treatment: Rehabilitation  SUBJECTIVE:   SUBJECTIVE STATEMENT: Improving overall, dizziness has all but gone away. Stamina is improving able [place] and no need for stopping and rest.   Pt accompanied by: significant other  PERTINENT HISTORY: Patient presents for assessment since fall at home yesterday. Patient has had intermittent episodes of dizziness and lightheadedness since having COVID and also being on amlodipine. Patient denies any syncope however she did hit her head. With intermittent dizziness today she wanted to get evaluated. Patient is on aspirin. No neurologic signs or symptoms. Patient had mild injury to the right arm no pain with movement however skin abrasion and put a Band-Aid on.  PAIN:  Are you having pain? No   PRECAUTIONS: Fall Per husband- "okay with numbers (vitals) going down as long as she recovers within 2 minutes."   WEIGHT BEARING RESTRICTIONS: No  FALLS: Has patient fallen in last 6 months? Yes. Number of falls 6-7  LIVING ENVIRONMENT: Lives with: lives with their spouse Lives in: House/apartment Stairs: Yes: Internal: does not use steps; bilateral but cannot reach both and External: 3-4 steps; on right going up Has following equipment at home: Walker - 4 wheeled, uses rollator at night  PLOF: Independent  PATIENT GOALS: eliminate symptoms  OBJECTIVE:  Vitals at start: 93%, 94 bpm BP after warm-up: 157/86 mmHg, 100 bpm Vitals at end of session: 131/76 mmHg, 110 bpm (post exercise)   TODAY'S TREATMENT: 10/23/22 Activity Comments  Slow-motion march 2x25 ft -difficulty with single  limb support (notable DOE and initial O2 87% and) recovery to 91+%  Tabata 20 sec work, 10 sec rest Sit to stand w/ march, made 3 rounds before rest period rates 7/10 RPE. Three additional rounds with RPE increase to 8-10/10.  Able to complete 9 of 20 intervals during 5 min period  Balance circuit -tandem walk forward x 25 ft -alt cone taps 1x -quick backwards shuffle 3 rounds  Coordination/balance circuit -side shuffle to cone touch x 60 sec, then added cog dual task x 60  sec. Noted fatigue/SOB with task and reveals 87%, 134 bpm at end of session  PNF chops 5# 1x10 Low to high, emphasis on following w/ gaze (VOR cancellation)        Beginning vitals:  O2 sats 93%.  HR 106 bpm  TODAY'S TREATMENT: 10/18/22 Activity Comments  Performed at counter top in 7 min without seated break:  standing hip ABD 10x each side standing hip EX 10x each side Forward/backwards walking 4x along counter top Standing pursed lip breathing, 5-8 reps Marching x10  Cues for pacing; CGA-min A for stability occasionally    Vitals mid- activity: 89-90 spO2, 122 bpm; vitals after activity:  92% spO2, 131 bpm allowed sitting rest break for vitals to normalize before proceeding   Performed at counter top in 5.5 min without rest break:  Sidestepping R and L 4 x along counter minisquats 10x tandem walk 4x along counter-cues for looking ahead   Vitals after activity: 91 spO2,  130 bpm; allowed sitting rest break for vitals to normalize before proceeding   Performed at counter top in 7 min without rest break:  [place] 2x along counter STS with green medball at chest 10x Stagger stance forward/back rocking x 10 reps each foot  vitals after activity: 92 spO2,  125 bpm; allowed sitting rest break for vitals to normalize before proceeding   Performed standing for 4.5 min without rest break:  Farmer's carry, 7# 50 ft x 2 reps, with mild instability Forward marching along counter 4 reps   vitals after activity: 89 spO2,  134 bpm; allowed sitting rest break for vitals to normalize before proceeding   CGA-min A for stability            PATIENT EDUCATION: Education details: Reviewed Breathing techniques for slowed pace of breathing:  pursed lip breathing, diaphragm and chest breathing Person educated: Patient and Spouse Education method: Explanation and Demonstration Education comprehension: verbalized understanding     HOME EXERCISE PROGRAM Last updated: 10/02/22 Access  Code: [facility] URL: [email or web address] Date: 09/20/2022 Prepared by: [facility]  Program Notes walk 2-3 minutes in the house; monitor vitals and stop if spO2 130 bpm or if other symptoms occur. perform this 3x/day.   Exercises - Narrow Stance with Counter Support  - 1 x daily - 5 x weekly - 2-3 sets - 30 sec hold - Romberg Stance with Eyes Closed  - 1 x daily - 5 x weekly - 2-3 sets - 30 sec hold - Corner Balance Feet Apart: Eyes Closed With Head Turns  - 1 x daily - 5 x weekly - 2-3 sets - 30 sec hold - Wide Stance with Eyes Closed and Head Nods  - 1 x daily - 5 x weekly - 2-3 sets - 30 sec hold - Side Step Overs with Cones and Counter Support  - 1 x daily - 5 x weekly - 2 sets - 10 reps - Figure-8 Walking Around  Cones  - 1 x daily - 5 x weekly - 2 sets - 10 reps - Standing Toe Taps  - 1 x daily - 5 x weekly - 3 sets - 30 sec hold    Below measures were taken at time of initial evaluation unless otherwise specified:   DIAGNOSTIC FINDINGS: IMPRESSION: 1. No evidence of acute abnormality. 2. High posterior left scalp contusion without acute calvarial fracture.  COGNITION: Overall cognitive status: Within functional limits for tasks assessed   SENSATION: WFL  EDEMA:    MUSCLE TONE:    DTRs:    POSTURE:  No Significant postural limitations  Cervical ROM:    Active A/PROM (deg) eval  Flexion WNL  Extension WNL  Right lateral flexion WNL  Left lateral flexion WNL  Right rotation WNL  Left rotation WNL  (Blank rows = not tested)  Looking up brings on symptoms  STRENGTH:   LOWER EXTREMITY MMT:   MMT Right eval Left eval  Hip flexion    Hip abduction    Hip adduction    Hip internal rotation    Hip external rotation    Knee flexion    Knee extension    Ankle dorsiflexion    Ankle plantarflexion    Ankle inversion    Ankle eversion    (Blank rows = not tested)  BED MOBILITY:   independent  TRANSFERS: Assistive device utilized: None  Sit to stand: Complete Independence Stand to sit: Complete Independence Chair to chair: Complete Independence Floor:  DNT  RAMP:   CURB:   GAIT: Gait pattern: decreased stride length Distance walked:  Assistive device utilized: None Level of assistance: Complete Independence Comments: ambulates with short stride/increased double limb support time, somewhat tentative in her confidence for gait  FUNCTIONAL TESTS:    PATIENT SURVEYS:    VESTIBULAR ASSESSMENT:  GENERAL OBSERVATION: wears bifocals   SYMPTOM BEHAVIOR:  Subjective history: hx of long-COVID and notes pervasive symptoms ever since  Non-Vestibular symptoms:  none  Type of dizziness: Spinning/Vertigo and Unsteady with head/body turns  Frequency: position change  Duration: 3-7 sec  Aggravating factors: Induced by position change: lying supine and supine to sit and Worse in the dark  Relieving factors: closing eyes and slow movements  Progression of symptoms: unchanged  OCULOMOTOR EXAM:  Ocular Alignment: normal  Ocular ROM: No Limitations  Spontaneous Nystagmus: absent  Gaze-Induced Nystagmus: absent  Smooth Pursuits: intact  Saccades: hypometric/undershoots  Convergence/Divergence: 4 cm     VESTIBULAR - OCULAR REFLEX:   Slow VOR: Normal  VOR Cancellation: Normal  Head-Impulse Test: HIT Right: negative HIT Left: negative  Dynamic Visual Acuity: Not able to be assessed   POSITIONAL TESTING: Right Dix-Hallpike: no nystagmus and experiences spinning Left Dix-Hallpike: no nystagmus and minimal symptoms as compared to right Right Roll Test: no nystagmus and feels spinning 3-7 sec Left Roll Test: no nystagmus and less as compared to right  When transitioning Supine to sit: around 25-35 degrees elevation she feels most dramatic spinning sensation  MOTION SENSITIVITY:  Motion Sensitivity Quotient Intensity: 0 = none, 1 = Lightheaded, 2 = Mild, 3  = Moderate, 4 = Severe, 5 = Vomiting  Intensity  1. Sitting to supine   2. Supine to L side   3. Supine to R side   4. Supine to sitting   5. L Hallpike-Dix   6. Up from L    7. R Hallpike-Dix   8. Up from R    9. Sitting, head tipped to  L knee   10. Head up from L knee   11. Sitting, head tipped to R knee   12. Head up from R knee   13. Sitting head turns x5   14.Sitting head nods x5   15. In stance, 180 turn to L    16. In stance, 180 turn to R     OTHOSTATICS: not done  FUNCTIONAL GAIT: Functional gait assessment: NT  M-CTSIB  Condition 1: Firm Surface, EO 30 Sec, Normal Sway  Condition 2: Firm Surface, EC 30 Sec, Mild Sway  Condition 3: Foam Surface, EO 30 Sec, Mild Sway  Condition 4: Foam Surface, EC 6 Sec, Severe Sway      GOALS: Goals reviewed with patient? Yes  SHORT TERM GOALS: Target date: 09/08/2022    Patient will be independent in HEP to improve functional outcomes Baseline: Goal status: MET   2.  Patient will be free of positional vertigo to improve quality of life and reduce unsteadiness and risk for falls Baseline: symptoms with arising from supine and right rolling; negative 09/27/22 Goal status: MET 09/27/22     LONG TERM GOALS: Target date: 11/08/2022    Demo improved vestibular function and safety in low light conditions per mild sway x 30 sec condition 4 M-CTSIB Baseline: 6 seconds, severe; 13 sec with mod-severe sway 09/27/22 Goal status: IN PROGRESS 09/27/22  2.  Manifest improved balance and low risk for falls per score 25/30 Functional Gait Assessment to improve safety with household and community mobility Baseline: 17/30 09/27/22 Goal status: IN PROGRESS 09/27/22  3. Patient to report tolerance for 10-15 minute walk without rest break with LRAD as needed for safety.  Baseline: tolerating 5 min  Goal status: IN PROGRESS  4.  Patient to report tolerance for cooking a meal without husband's help and without rest break required d/t  fatigue or imbalance.   Baseline: tolerating 5 min  Goal status: IN PROGRESS       ASSESSMENT:  CLINICAL IMPRESSION: Continued with skilled training in improving balance and metabolic conditioning with emphasis on increased exercise tolerance with monitoring of vitals and education on effort to 7-8/10 RPE during bouts. Pt demonstrates instances of O2% decline to 87-88% during exertion and able to restore 92+% with rest periods of 30-90 sec. Continued with activities to improve dynamic balance for safety during household tasks such as sidestepping and reach down/up for items/tasks. Difficulty with maintaining cognitive dual task activity and difficulty with slow, single-limb support control. Continued sessions to progress balance and activity tolerance.   OBJECTIVE IMPAIRMENTS: Abnormal gait, decreased activity tolerance, decreased balance, decreased knowledge of use of DME, difficulty walking, and dizziness.   ACTIVITY LIMITATIONS: carrying, lifting, bending, standing, stairs, bathing, reach over head, and locomotion level  PARTICIPATION LIMITATIONS: meal prep, laundry, driving, and community activity  PERSONAL FACTORS: Time since onset of injury/illness/exacerbation and 1 comorbidity: COVID, HTN  are also affecting patient's functional outcome.   REHAB POTENTIAL: Good  CLINICAL DECISION MAKING: Stable/uncomplicated  EVALUATION COMPLEXITY: Low   PLAN:  PT FREQUENCY: 2x/week  PT DURATION: 6 weeks  PLANNED INTERVENTIONS: Therapeutic exercises, Therapeutic activity, Neuromuscular re-education, Balance training, Gait training, Patient/Family education, Self Care, Joint mobilization, Stair training, Vestibular training, Canalith repositioning, DME instructions, Aquatic Therapy, Dry Needling, Electrical stimulation, Spinal mobilization, Cryotherapy, Moist heat, Taping, and Manual therapy  PLAN FOR NEXT SESSION: monitor O2 and HR with activity; work on progressing standing/walking  endurance, balance challenges: EC/foam, walking EC, walking backwards, narrow BOS, stepping over obstacles (working away  from counter and out in gym area with less support as she is able to progress)  4:27 PM, 10/23/22 M. Sherlyn Lees, PT, DPT Physical Therapist- [place] Office Number: [phone]   [place] [facility] [address], [place], [place] [address] Phone # [phone] Fax # [phone]

## 2022-10-24 NOTE — Telephone Encounter (Signed)
Please order 2 lt O2 with exercise. thanks

## 2022-10-25 ENCOUNTER — Ambulatory Visit: Payer: Medicare Other | Admitting: Physical Therapy

## 2022-10-25 DIAGNOSIS — R2681 Unsteadiness on feet: Secondary | ICD-10-CM | POA: Diagnosis not present

## 2022-10-25 DIAGNOSIS — M6281 Muscle weakness (generalized): Secondary | ICD-10-CM

## 2022-10-25 NOTE — Therapy (Signed)
OUTPATIENT PHYSICAL THERAPY VESTIBULAR NOTE     Patient Name: Tracy Young MRN: [id number] DOB:10/06/1945, 77 y.o., female Today's Date: 10/25/2022     END OF SESSION:  PT End of Session - 10/25/22 1312     Visit Number 15    Number of Visits 21    Date for PT Re-Evaluation 11/08/22    Authorization Type Medicare A & B    Progress Note Due on Visit 68    PT Start Time 1315    PT Stop Time 1400    PT Time Calculation (min) 45 min    Equipment Utilized During Treatment Gait belt    Activity Tolerance Patient tolerated treatment well;Patient limited by fatigue   fatigue, SOB   Behavior During Therapy WFL for tasks assessed/performed                        Past Medical History:  Diagnosis Date   Chronic kidney disease, stage 3a ([place]) 06/27/2021   COVID-19    GERD without esophagitis 06/27/2021   Hypertension    Mild intermittent asthma without complication 73/41/9379   Mixed hyperlipidemia 06/27/2021   Pneumonia    Past Surgical History:  Procedure Laterality Date   TOTAL KNEE ARTHROPLASTY     Patient Active Problem List   Diagnosis Date Noted   COVID-19 virus infection 06/27/2021   Acute respiratory failure with hypoxia ([place]) 06/27/2021   Chronic kidney disease, stage 3a ([place]) 06/27/2021   GERD without esophagitis 06/27/2021   Mixed hyperlipidemia 06/27/2021   Mild intermittent asthma with (acute) exacerbation 06/27/2021   Sepsis due to COVID-19 ([place]) 06/27/2021    PCP: Maury Dus, MD REFERRING PROVIDER: Maury Dus, MD  REFERRING DIAG: R42 (ICD-10-CM) - Dizziness and giddiness  THERAPY DIAG:  Unsteadiness on feet  Muscle weakness (generalized)  ONSET DATE: July-August 2023  Rationale for Evaluation and Treatment: Rehabilitation  SUBJECTIVE:   SUBJECTIVE STATEMENT: Worked hard in the PT session last time.  Feeling good. Pt accompanied by: significant other  PERTINENT HISTORY: Patient presents for assessment since fall at home  yesterday. Patient has had intermittent episodes of dizziness and lightheadedness since having COVID and also being on amlodipine. Patient denies any syncope however she did hit her head. With intermittent dizziness today she wanted to get evaluated. Patient is on aspirin. No neurologic signs or symptoms. Patient had mild injury to the right arm no pain with movement however skin abrasion and put a Band-Aid on.  PAIN:  Are you having pain? No   PRECAUTIONS: Fall Per husband- "okay with numbers (vitals) going down as long as she recovers within 2 minutes."   WEIGHT BEARING RESTRICTIONS: No  FALLS: Has patient fallen in last 6 months? Yes. Number of falls 6-7  LIVING ENVIRONMENT: Lives with: lives with their spouse Lives in: House/apartment Stairs: Yes: Internal: does not use steps; bilateral but cannot reach both and External: 3-4 steps; on right going up Has following equipment at home: Walker - 4 wheeled, uses rollator at night  PLOF: Independent  PATIENT GOALS: eliminate symptoms  OBJECTIVE:  Vitals at start: 95%, 101 bpm Vitals at end:  93%, HR 115 bpm    TODAY'S TREATMENT: 10/25/2022 Activity Comments  Functional strengthening circuit: -sit<>stand -sit<>stand + march  -20 sec work, 10 sec rest  5 min period:  able to complete 6 rounds with brief rest periods, rates exertion level as 7-8/10.  HR 121  Strengthening circuit: -Minisquats to up on toes -Lateral weightshift  with lift for SLS 20 sec work, 10 sec rest 3 minutes (at end of session, HR 139>129, O2 sats 92)  Balance circuit: Sidestepping Forward/back walking-min guard/min assist for backwards with several near LOB HR 167 bpm, O2 90%, with 2 minute rest  Gait with head turns, head nods 25 ft x 4 reps each, min guard/min assist Wider BOS and slowed pace     NMR:  Standing on solid surface feet apart, feet together EO/EC x 30 sec Standing on Airex EO/EC x 30 sec (EC 2 x 30 sec)  Moderate sway on EC on foam  At end of  session:  forward lean>upright posture with deep breaths, 6 reps            HOME EXERCISE PROGRAM Last updated: 10/02/22 Access Code: [facility] URL: [email or web address] Date: 09/20/2022 Prepared by: [facility]  Program Notes walk 2-3 minutes in the house; monitor vitals and stop if spO2 130 bpm or if other symptoms occur. perform this 3x/day.   Exercises - Narrow Stance with Counter Support  - 1 x daily - 5 x weekly - 2-3 sets - 30 sec hold - Romberg Stance with Eyes Closed  - 1 x daily - 5 x weekly - 2-3 sets - 30 sec hold - Corner Balance Feet Apart: Eyes Closed With Head Turns  - 1 x daily - 5 x weekly - 2-3 sets - 30 sec hold - Wide Stance with Eyes Closed and Head Nods  - 1 x daily - 5 x weekly - 2-3 sets - 30 sec hold - Side Step Overs with Cones and Counter Support  - 1 x daily - 5 x weekly - 2 sets - 10 reps - Figure-8 Walking Around Cones  - 1 x daily - 5 x weekly - 2 sets - 10 reps - Standing Toe Taps  - 1 x daily - 5 x weekly - 3 sets - 30 sec hold    Below measures were taken at time of initial evaluation unless otherwise specified:   DIAGNOSTIC FINDINGS: IMPRESSION: 1. No evidence of acute abnormality. 2. High posterior left scalp contusion without acute calvarial fracture.  COGNITION: Overall cognitive status: Within functional limits for tasks assessed   SENSATION: WFL  EDEMA:    MUSCLE TONE:    DTRs:    POSTURE:  No Significant postural limitations  Cervical ROM:    Active A/PROM (deg) eval  Flexion WNL  Extension WNL  Right lateral flexion WNL  Left lateral flexion WNL  Right rotation WNL  Left rotation WNL  (Blank rows = not tested)  Looking up brings on symptoms  STRENGTH:   LOWER EXTREMITY MMT:   MMT Right eval Left eval  Hip flexion    Hip abduction    Hip adduction    Hip internal rotation    Hip external rotation    Knee flexion    Knee extension    Ankle  dorsiflexion    Ankle plantarflexion    Ankle inversion    Ankle eversion    (Blank rows = not tested)  BED MOBILITY:  independent  TRANSFERS: Assistive device utilized: None  Sit to stand: Complete Independence Stand to sit: Complete Independence Chair to chair: Complete Independence Floor:  DNT  RAMP:   CURB:   GAIT: Gait pattern: decreased stride length Distance walked:  Assistive device utilized: None Level of assistance: Complete Independence Comments: ambulates with short stride/increased double limb support time, somewhat tentative  in her confidence for gait  FUNCTIONAL TESTS:    PATIENT SURVEYS:    VESTIBULAR ASSESSMENT:  GENERAL OBSERVATION: wears bifocals   SYMPTOM BEHAVIOR:  Subjective history: hx of long-COVID and notes pervasive symptoms ever since  Non-Vestibular symptoms:  none  Type of dizziness: Spinning/Vertigo and Unsteady with head/body turns  Frequency: position change  Duration: 3-7 sec  Aggravating factors: Induced by position change: lying supine and supine to sit and Worse in the dark  Relieving factors: closing eyes and slow movements  Progression of symptoms: unchanged  OCULOMOTOR EXAM:  Ocular Alignment: normal  Ocular ROM: No Limitations  Spontaneous Nystagmus: absent  Gaze-Induced Nystagmus: absent  Smooth Pursuits: intact  Saccades: hypometric/undershoots  Convergence/Divergence: 4 cm     VESTIBULAR - OCULAR REFLEX:   Slow VOR: Normal  VOR Cancellation: Normal  Head-Impulse Test: HIT Right: negative HIT Left: negative  Dynamic Visual Acuity: Not able to be assessed   POSITIONAL TESTING: Right Dix-Hallpike: no nystagmus and experiences spinning Left Dix-Hallpike: no nystagmus and minimal symptoms as compared to right Right Roll Test: no nystagmus and feels spinning 3-7 sec Left Roll Test: no nystagmus and less as compared to right  When transitioning Supine to sit: around 25-35 degrees elevation she feels most  dramatic spinning sensation  MOTION SENSITIVITY:  Motion Sensitivity Quotient Intensity: 0 = none, 1 = Lightheaded, 2 = Mild, 3 = Moderate, 4 = Severe, 5 = Vomiting  Intensity  1. Sitting to supine   2. Supine to L side   3. Supine to R side   4. Supine to sitting   5. L Hallpike-Dix   6. Up from L    7. R Hallpike-Dix   8. Up from R    9. Sitting, head tipped to L knee   10. Head up from L knee   11. Sitting, head tipped to R knee   12. Head up from R knee   13. Sitting head turns x5   14.Sitting head nods x5   15. In stance, 180 turn to L    16. In stance, 180 turn to R     OTHOSTATICS: not done  FUNCTIONAL GAIT: Functional gait assessment: NT  M-CTSIB  Condition 1: Firm Surface, EO 30 Sec, Normal Sway  Condition 2: Firm Surface, EC 30 Sec, Mild Sway  Condition 3: Foam Surface, EO 30 Sec, Mild Sway  Condition 4: Foam Surface, EC 6 Sec, Severe Sway      GOALS: Goals reviewed with patient? Yes  SHORT TERM GOALS: Target date: 09/08/2022    Patient will be independent in HEP to improve functional outcomes Baseline: Goal status: MET   2.  Patient will be free of positional vertigo to improve quality of life and reduce unsteadiness and risk for falls Baseline: symptoms with arising from supine and right rolling; negative 09/27/22 Goal status: MET 09/27/22     LONG TERM GOALS: Target date: 11/08/2022    Demo improved vestibular function and safety in low light conditions per mild sway x 30 sec condition 4 M-CTSIB Baseline: 6 seconds, severe; 13 sec with mod-severe sway 09/27/22 Goal status: IN PROGRESS 09/27/22  2.  Manifest improved balance and low risk for falls per score 25/30 Functional Gait Assessment to improve safety with household and community mobility Baseline: 17/30 09/27/22 Goal status: IN PROGRESS 09/27/22  3. Patient to report tolerance for 10-15 minute walk without rest break with LRAD as needed for safety.  Baseline: tolerating 5 min  Goal status:  IN  PROGRESS  4.  Patient to report tolerance for cooking a meal without husband's help and without rest break required d/t fatigue or imbalance.   Baseline: tolerating 5 min  Goal status: IN PROGRESS       ASSESSMENT:  CLINICAL IMPRESSION: Worked in skilled PT session today on functional strengthening, static and dynamic balance activities.  With static balance, pt noted to have improved to 30 seconds on Condition 4 MCSTIB with moderate sway, compared to 6 sec with severe sway initially.  With dynamic balance activities, she has slowed pace, decreased step length with balance challenges.  She will continue to benefit from skilled PT towards LTGs for improved functional mobility and decreased fall risk.    OBJECTIVE IMPAIRMENTS: Abnormal gait, decreased activity tolerance, decreased balance, decreased knowledge of use of DME, difficulty walking, and dizziness.   ACTIVITY LIMITATIONS: carrying, lifting, bending, standing, stairs, bathing, reach over head, and locomotion level  PARTICIPATION LIMITATIONS: meal prep, laundry, driving, and community activity  PERSONAL FACTORS: Time since onset of injury/illness/exacerbation and 1 comorbidity: COVID, HTN  are also affecting patient's functional outcome.   REHAB POTENTIAL: Good  CLINICAL DECISION MAKING: Stable/uncomplicated  EVALUATION COMPLEXITY: Low   PLAN:  PT FREQUENCY: 2x/week  PT DURATION: 6 weeks  PLANNED INTERVENTIONS: Therapeutic exercises, Therapeutic activity, Neuromuscular re-education, Balance training, Gait training, Patient/Family education, Self Care, Joint mobilization, Stair training, Vestibular training, Canalith repositioning, DME instructions, Aquatic Therapy, Dry Needling, Electrical stimulation, Spinal mobilization, Cryotherapy, Moist heat, Taping, and Manual therapy  PLAN FOR NEXT SESSION: May discuss progression of HEP to include functional strengthening?; monitor O2 and HR with activity; work on progressing  standing/walking endurance, balance challenges: EC/foam, walking EC, walking backwards, narrow BOS, stepping over obstacles   Mady Haagensen, PT 10/25/22 4:21 PM Phone: [phone] Fax: [place] at [facility] [address], [place], [place] [address] Phone # [phone] Fax # [phone]

## 2022-10-26 NOTE — Therapy (Signed)
OUTPATIENT PHYSICAL THERAPY VESTIBULAR NOTE     Patient Name: DIPALI BASQUEZ MRN: [id number] DOB:[birth year], 77 y.o., female Today's Date: 11/06/2022     END OF SESSION:  PT End of Session - 11/06/22 1619     Visit Number 18    Number of Visits 21    Date for PT Re-Evaluation 11/08/22    Authorization Type Medicare A & B    Progress Note Due on Visit 14    PT Start Time [id number]    PT Stop Time 1617    PT Time Calculation (min) 42 min    Equipment Utilized During Treatment Gait belt    Activity Tolerance Patient tolerated treatment well;Patient limited by fatigue   fatigue, SOB   Behavior During Therapy WFL for tasks assessed/performed                        Past Medical History:  Diagnosis Date   Chronic kidney disease, stage 3a ([place]) 06/27/2021   COVID-19    GERD without esophagitis 06/27/2021   Hypertension    Mild intermittent asthma without complication [id number]   Mixed hyperlipidemia 06/27/2021   Pneumonia    Past Surgical History:  Procedure Laterality Date   TOTAL KNEE ARTHROPLASTY     Patient Active Problem List   Diagnosis Date Noted   COVID-19 virus infection 06/27/2021   Acute respiratory failure with hypoxia ([place]) 06/27/2021   Chronic kidney disease, stage 3a ([place]) 06/27/2021   GERD without esophagitis 06/27/2021   Mixed hyperlipidemia 06/27/2021   Mild intermittent asthma with (acute) exacerbation 06/27/2021   Sepsis due to COVID-19 ([place]) 06/27/2021    PCP: Maury Dus, MD REFERRING PROVIDER: Maury Dus, MD  REFERRING DIAG: R42 (ICD-10-CM) - Dizziness and giddiness  THERAPY DIAG:  Unsteadiness on feet  Muscle weakness (generalized)  Difficulty in walking, not elsewhere classified  Dizziness and giddiness  ONSET DATE: July-August 2023  Rationale for Evaluation and Treatment: Rehabilitation  SUBJECTIVE:   SUBJECTIVE STATEMENT: Been able to just about anything I want to do just as going to church or the store. Able  to make her own breakfast and lunch now.  Pt accompanied by: significant other  PERTINENT HISTORY: Patient presents for assessment since fall at home yesterday. Patient has had intermittent episodes of dizziness and lightheadedness since having COVID and also being on amlodipine. Patient denies any syncope however she did hit her head. With intermittent dizziness today she wanted to get evaluated. Patient is on aspirin. No neurologic signs or symptoms. Patient had mild injury to the right arm no pain with movement however skin abrasion and put a Band-Aid on.  PAIN:  Are you having pain? No   PRECAUTIONS: Fall Per husband- MD "okay with numbers (vitals) going down as long as she recovers within 2 minutes."   WEIGHT BEARING RESTRICTIONS: No  FALLS: Has patient fallen in last 6 months? Yes. Number of falls 6-7  LIVING ENVIRONMENT: Lives with: lives with their spouse Lives in: House/apartment Stairs: Yes: Internal: does not use steps; bilateral but cannot reach both and External: 3-4 steps; on right going up Has following equipment at home: Walker - 4 wheeled, uses rollator at night  PLOF: Independent  PATIENT GOALS: eliminate symptoms  OBJECTIVE:    TODAY'S TREATMENT: 11/06/22 Activity Comments  Vitals at start of session 93% spO2, 107 bpm  Vitals at end of session    TM walking 1.8 mph 2.5 min x 2 Occasionally catching feet  88% 126  bpm at 2.5 min, then allowed standing pursed lip breathing break to allow vitals to normalize; 88% 125 bpm at 5 min, then allowed standing pursed lip breathing break to allow vitals to normalize  Performed in 6.5 min without rest break:   wall squats with red pball 10x sidestepping 3# 2x60f backwards walking 3# 2x349f   Vitals after activity: 90% spO2, 129 bpm; allowed sitting rest break for vitals to normalize before proceeding   Performed in 5.75 min without rest break:  SLS with 3# 40" each LE standing hip ABD 3# 10x each  4 square step 3#  3x      Vitals after activity: 97 spO2,  126 bpm; allowed sitting rest break for vitals to normalize before proceeding   min A with 4 square step d/t imbalance       PATIENT EDUCATION: Education details: discussed patient's goals for returning to water aerobics and progress towards goals; also encouraged lower weight/higher reps when starting back to the gym Person educated: Patient and Spouse Education method: Explanation and Demonstration Education comprehension: verbalized understanding and returned demonstration      HOME EXERCISE PROGRAM Last updated: 10/02/22 Access Code: [facility]an [facility] CenterRL: [email or web address] Date: 09/20/2022 Prepared by: MCSavanna ClinicProgram Notes walk 2-3 minutes in the house; monitor vitals and stop if spO2 130 bpm or if other symptoms occur. perform this 3x/day.   Exercises - Narrow Stance with Counter Support  - 1 x daily - 5 x weekly - 2-3 sets - 30 sec hold - Romberg Stance with Eyes Closed  - 1 x daily - 5 x weekly - 2-3 sets - 30 sec hold - Corner Balance Feet Apart: Eyes Closed With Head Turns  - 1 x daily - 5 x weekly - 2-3 sets - 30 sec hold - Wide Stance with Eyes Closed and Head Nods  - 1 x daily - 5 x weekly - 2-3 sets - 30 sec hold - Side Step Overs with Cones and Counter Support  - 1 x daily - 5 x weekly - 2 sets - 10 reps - Figure-8 Walking Around Cones  - 1 x daily - 5 x weekly - 2 sets - 10 reps - Standing Toe Taps  - 1 x daily - 5 x weekly - 3 sets - 30 sec hold    Below measures were taken at time of initial evaluation unless otherwise specified:   DIAGNOSTIC FINDINGS: IMPRESSION: 1. No evidence of acute abnormality. 2. High posterior left scalp contusion without acute calvarial fracture.  COGNITION: Overall cognitive status: Within functional limits for tasks assessed   SENSATION: WFL  EDEMA:    MUSCLE TONE:    DTRs:    POSTURE:  No Significant postural  limitations  Cervical ROM:    Active A/PROM (deg) eval  Flexion WNL  Extension WNL  Right lateral flexion WNL  Left lateral flexion WNL  Right rotation WNL  Left rotation WNL  (Blank rows = not tested)  Looking up brings on symptoms  STRENGTH:   LOWER EXTREMITY MMT:   MMT Right eval Left eval  Hip flexion    Hip abduction    Hip adduction    Hip internal rotation    Hip external rotation    Knee flexion    Knee extension    Ankle dorsiflexion    Ankle plantarflexion    Ankle inversion    Ankle eversion    (Blank rows = not tested)  BED MOBILITY:  independent  TRANSFERS: Assistive device utilized: None  Sit to stand: Complete Independence Stand to sit: Complete Independence Chair to chair: Complete Independence Floor:  DNT  RAMP:   CURB:   GAIT: Gait pattern: decreased stride length Distance walked:  Assistive device utilized: None Level of assistance: Complete Independence Comments: ambulates with short stride/increased double limb support time, somewhat tentative in her confidence for gait  FUNCTIONAL TESTS:    PATIENT SURVEYS:    VESTIBULAR ASSESSMENT:  GENERAL OBSERVATION: wears bifocals   SYMPTOM BEHAVIOR:  Subjective history: hx of long-COVID and notes pervasive symptoms ever since  Non-Vestibular symptoms:  none  Type of dizziness: Spinning/Vertigo and Unsteady with head/body turns  Frequency: position change  Duration: 3-7 sec  Aggravating factors: Induced by position change: lying supine and supine to sit and Worse in the dark  Relieving factors: closing eyes and slow movements  Progression of symptoms: unchanged  OCULOMOTOR EXAM:  Ocular Alignment: normal  Ocular ROM: No Limitations  Spontaneous Nystagmus: absent  Gaze-Induced Nystagmus: absent  Smooth Pursuits: intact  Saccades: hypometric/undershoots  Convergence/Divergence: 4 cm     VESTIBULAR - OCULAR REFLEX:   Slow VOR: Normal  VOR Cancellation:  Normal  Head-Impulse Test: HIT Right: negative HIT Left: negative  Dynamic Visual Acuity: Not able to be assessed   POSITIONAL TESTING: Right Dix-Hallpike: no nystagmus and experiences spinning Left Dix-Hallpike: no nystagmus and minimal symptoms as compared to right Right Roll Test: no nystagmus and feels spinning 3-7 sec Left Roll Test: no nystagmus and less as compared to right  When transitioning Supine to sit: around 25-35 degrees elevation she feels most dramatic spinning sensation  MOTION SENSITIVITY:  Motion Sensitivity Quotient Intensity: 0 = none, 1 = Lightheaded, 2 = Mild, 3 = Moderate, 4 = Severe, 5 = Vomiting  Intensity  1. Sitting to supine   2. Supine to L side   3. Supine to R side   4. Supine to sitting   5. L Hallpike-Dix   6. Up from L    7. R Hallpike-Dix   8. Up from R    9. Sitting, head tipped to L knee   10. Head up from L knee   11. Sitting, head tipped to R knee   12. Head up from R knee   13. Sitting head turns x5   14.Sitting head nods x5   15. In stance, 180 turn to L    16. In stance, 180 turn to R     OTHOSTATICS: not done  FUNCTIONAL GAIT: Functional gait assessment: NT  M-CTSIB  Condition 1: Firm Surface, EO 30 Sec, Normal Sway  Condition 2: Firm Surface, EC 30 Sec, Mild Sway  Condition 3: Foam Surface, EO 30 Sec, Mild Sway  Condition 4: Foam Surface, EC 6 Sec, Severe Sway      GOALS: Goals reviewed with patient? Yes  SHORT TERM GOALS: Target date: 09/08/2022    Patient will be independent in HEP to improve functional outcomes Baseline: Goal status: MET   2.  Patient will be free of positional vertigo to improve quality of life and reduce unsteadiness and risk for falls Baseline: symptoms with arising from supine and right rolling; negative 09/27/22 Goal status: MET 09/27/22     LONG TERM GOALS: Target date: 11/08/2022    Demo improved vestibular function and safety in low light conditions per mild sway x 30 sec condition  4 M-CTSIB Baseline: 6 seconds, severe; 13 sec with mod-severe sway 09/27/22 Goal  status: IN PROGRESS 09/27/22  2.  Manifest improved balance and low risk for falls per score 25/30 Functional Gait Assessment to improve safety with household and community mobility Baseline: 17/30 09/27/22 Goal status: IN PROGRESS 09/27/22  3. Patient to report tolerance for 10-15 minute walk without rest break with LRAD as needed for safety.  Baseline: tolerating 5 min  Goal status: IN PROGRESS  4.  Patient to report tolerance for cooking a meal without husband's help and without rest break required d/t fatigue or imbalance.   Baseline: tolerating 5 min  Goal status: IN PROGRESS       ASSESSMENT:  CLINICAL IMPRESSION: Patient arrived to session with report of improvement in functional activity tolerance since start of PT. Educated patient on working on higher reps/lower weight once returning to the gym and cautioned on intensity of exercise in the water to prevent excessive fatigue/abnormal vitals. Patient reported understanding. Worked on gait training on TM- able to perform at quicker pace and with HR <130bpm throughout today; improved compared to previous sessions. Continued with interval training with use of weighted resistance to increase challenge. Most difficulty evident with SLS activities such as stepping over obstacles. Patient tolerated session well and vitals WNL upon leaving.   OBJECTIVE IMPAIRMENTS: Abnormal gait, decreased activity tolerance, decreased balance, decreased knowledge of use of DME, difficulty walking, and dizziness.   ACTIVITY LIMITATIONS: carrying, lifting, bending, standing, stairs, bathing, reach over head, and locomotion level  PARTICIPATION LIMITATIONS: meal prep, laundry, driving, and community activity  PERSONAL FACTORS: Time since onset of injury/illness/exacerbation and 1 comorbidity: COVID, HTN  are also affecting patient's functional outcome.   REHAB POTENTIAL:  Good  CLINICAL DECISION MAKING: Stable/uncomplicated  EVALUATION COMPLEXITY: Low   PLAN:  PT FREQUENCY: 2x/week  PT DURATION: 6 weeks  PLANNED INTERVENTIONS: Therapeutic exercises, Therapeutic activity, Neuromuscular re-education, Balance training, Gait training, Patient/Family education, Self Care, Joint mobilization, Stair training, Vestibular training, Canalith repositioning, DME instructions, Aquatic Therapy, Dry Needling, Electrical stimulation, Spinal mobilization, Cryotherapy, Moist heat, Taping, and Manual therapy  PLAN FOR NEXT SESSION: May discuss progression of HEP to include functional strengthening?; monitor O2 and HR with activity; work on progressing standing/walking endurance, balance challenges: EC/foam, walking EC, walking backwards, narrow BOS, stepping over obstacles    Janene Harvey, PT, DPT 11/06/22 4:20 PM  [facility] at [facility] [address] [place], [place] [address] Phone # [phone] Fax # [phone]

## 2022-10-26 NOTE — Telephone Encounter (Signed)
Called and spoke with patient about O2 qualifications.  Patient stated she may have needed additional O2 with exercise at the beginning of her rehab, she no longer does.  Patient stated she has been doing good with her rehab and O2.  Patient checks sats and stated she is no longer having issues.  Patient stated she rest as needed with activity. Patient stated she had balance issues that were treated by her PCP and has had improvement with balance.

## 2022-10-30 ENCOUNTER — Ambulatory Visit: Payer: Medicare Other

## 2022-10-30 DIAGNOSIS — R2681 Unsteadiness on feet: Secondary | ICD-10-CM | POA: Diagnosis not present

## 2022-10-30 DIAGNOSIS — R262 Difficulty in walking, not elsewhere classified: Secondary | ICD-10-CM

## 2022-10-30 DIAGNOSIS — R42 Dizziness and giddiness: Secondary | ICD-10-CM

## 2022-10-30 DIAGNOSIS — M6281 Muscle weakness (generalized): Secondary | ICD-10-CM

## 2022-10-30 NOTE — Therapy (Signed)
OUTPATIENT PHYSICAL THERAPY VESTIBULAR NOTE     Patient Name: Tracy Young MRN: [id number] DOB:12-03-1945, 77 y.o., female Today's Date: 10/30/2022     END OF SESSION:  PT End of Session - 10/30/22 1532     Visit Number 16    Number of Visits 21    Date for PT Re-Evaluation 11/08/22    Authorization Type Medicare A & B    Progress Note Due on Visit 82    PT Start Time [id number]    PT Stop Time 1615    PT Time Calculation (min) 45 min    Equipment Utilized During Treatment Gait belt    Activity Tolerance Patient tolerated treatment well;Patient limited by fatigue   fatigue, SOB   Behavior During Therapy WFL for tasks assessed/performed                        Past Medical History:  Diagnosis Date   Chronic kidney disease, stage 3a ([place]) 06/27/2021   COVID-19    GERD without esophagitis 06/27/2021   Hypertension    Mild intermittent asthma without complication [id number]   Mixed hyperlipidemia 06/27/2021   Pneumonia    Past Surgical History:  Procedure Laterality Date   TOTAL KNEE ARTHROPLASTY     Patient Active Problem List   Diagnosis Date Noted   COVID-19 virus infection 06/27/2021   Acute respiratory failure with hypoxia ([place]) 06/27/2021   Chronic kidney disease, stage 3a ([place]) 06/27/2021   GERD without esophagitis 06/27/2021   Mixed hyperlipidemia 06/27/2021   Mild intermittent asthma with (acute) exacerbation 06/27/2021   Sepsis due to COVID-19 ([place]) 06/27/2021    PCP: Maury Dus, MD REFERRING PROVIDER: Maury Dus, MD///Rachel Hagler, MD (Dr. Noland Fordyce replacement)  REFERRING DIAG: R42 (ICD-10-CM) - Dizziness and giddiness  THERAPY DIAG:  Unsteadiness on feet  Muscle weakness (generalized)  Difficulty in walking, not elsewhere classified  Dizziness and giddiness  ONSET DATE: July-August 2023  Rationale for Evaluation and Treatment: Rehabilitation  SUBJECTIVE:   SUBJECTIVE STATEMENT: Walked outside for the first time tis past  Saturday. Able to walk cumulative 15 min requiring rest periods every 4-5 min.   Pt accompanied by: significant other  PERTINENT HISTORY: Patient presents for assessment since fall at home yesterday. Patient has had intermittent episodes of dizziness and lightheadedness since having COVID and also being on amlodipine. Patient denies any syncope however she did hit her head. With intermittent dizziness today she wanted to get evaluated. Patient is on aspirin. No neurologic signs or symptoms. Patient had mild injury to the right arm no pain with movement however skin abrasion and put a Band-Aid on.  PAIN:  Are you having pain? No   PRECAUTIONS: Fall Per husband- "okay with numbers (vitals) going down as long as she recovers within 2 minutes."   WEIGHT BEARING RESTRICTIONS: No  FALLS: Has patient fallen in last 6 months? Yes. Number of falls 6-7  LIVING ENVIRONMENT: Lives with: lives with their spouse Lives in: House/apartment Stairs: Yes: Internal: does not use steps; bilateral but cannot reach both and External: 3-4 steps; on right going up Has following equipment at home: Walker - 4 wheeled, uses rollator at night  PLOF: Independent  PATIENT GOALS: eliminate symptoms  OBJECTIVE:  Vitals at start: 119/74 mmHg, 96%   TODAY'S TREATMENT: 10/30/22 Activity Comments  6 MWT 920 ft, RPE of 13/20, vitals at end 86% and 124 bpm (age-matched norms are 1545 ft)  Slow motion gait to promote single limb  support Able to implement slight head turns for added balance challenge  Sit-stand 5x5, varied foot position for coordination challenge 15# KB hold  Standing on foam -EO/EC x 30 sec -pass green med ball around body 5x ea direction -pass green med ball between legs 5x ea direction -one foot on foam, standing PNF chops             Vitals at start: 95%, 101 bpm Vitals at end:  93%, HR 115 bpm    TODAY'S TREATMENT: 10/25/2022 Activity Comments  Functional strengthening  circuit: -sit<>stand -sit<>stand + march  -20 sec work, 10 sec rest  5 min period:  able to complete 6 rounds with brief rest periods, rates exertion level as 7-8/10.  HR 121  Strengthening circuit: -Minisquats to up on toes -Lateral weightshift with lift for SLS 20 sec work, 10 sec rest 3 minutes (at end of session, HR 139>129, O2 sats 92)  Balance circuit: Sidestepping Forward/back walking-min guard/min assist for backwards with several near LOB HR 167 bpm, O2 90%, with 2 minute rest  Gait with head turns, head nods 25 ft x 4 reps each, min guard/min assist Wider BOS and slowed pace     NMR:  Standing on solid surface feet apart, feet together EO/EC x 30 sec Standing on Airex EO/EC x 30 sec (EC 2 x 30 sec)  Moderate sway on EC on foam  At end of session:  forward lean>upright posture with deep breaths, 6 reps            HOME EXERCISE PROGRAM Last updated: 10/02/22 Access Code: [facility] URL: [email or web address] Date: 09/20/2022 Prepared by: [facility]  Program Notes walk 2-3 minutes in the house; monitor vitals and stop if spO2 130 bpm or if other symptoms occur. perform this 3x/day.   Exercises - Narrow Stance with Counter Support  - 1 x daily - 5 x weekly - 2-3 sets - 30 sec hold - Romberg Stance with Eyes Closed  - 1 x daily - 5 x weekly - 2-3 sets - 30 sec hold - Corner Balance Feet Apart: Eyes Closed With Head Turns  - 1 x daily - 5 x weekly - 2-3 sets - 30 sec hold - Wide Stance with Eyes Closed and Head Nods  - 1 x daily - 5 x weekly - 2-3 sets - 30 sec hold - Side Step Overs with Cones and Counter Support  - 1 x daily - 5 x weekly - 2 sets - 10 reps - Figure-8 Walking Around Cones  - 1 x daily - 5 x weekly - 2 sets - 10 reps - Standing Toe Taps  - 1 x daily - 5 x weekly - 3 sets - 30 sec hold    Below measures were taken at time of initial evaluation unless otherwise specified:   DIAGNOSTIC FINDINGS:  IMPRESSION: 1. No evidence of acute abnormality. 2. High posterior left scalp contusion without acute calvarial fracture.  COGNITION: Overall cognitive status: Within functional limits for tasks assessed   SENSATION: WFL  EDEMA:    MUSCLE TONE:    DTRs:    POSTURE:  No Significant postural limitations  Cervical ROM:    Active A/PROM (deg) eval  Flexion WNL  Extension WNL  Right lateral flexion WNL  Left lateral flexion WNL  Right rotation WNL  Left rotation WNL  (Blank rows = not tested)  Looking up brings on symptoms  STRENGTH:   LOWER EXTREMITY  MMT:   MMT Right eval Left eval  Hip flexion    Hip abduction    Hip adduction    Hip internal rotation    Hip external rotation    Knee flexion    Knee extension    Ankle dorsiflexion    Ankle plantarflexion    Ankle inversion    Ankle eversion    (Blank rows = not tested)  BED MOBILITY:  independent  TRANSFERS: Assistive device utilized: None  Sit to stand: Complete Independence Stand to sit: Complete Independence Chair to chair: Complete Independence Floor:  DNT  RAMP:   CURB:   GAIT: Gait pattern: decreased stride length Distance walked:  Assistive device utilized: None Level of assistance: Complete Independence Comments: ambulates with short stride/increased double limb support time, somewhat tentative in her confidence for gait  FUNCTIONAL TESTS:    PATIENT SURVEYS:    VESTIBULAR ASSESSMENT:  GENERAL OBSERVATION: wears bifocals   SYMPTOM BEHAVIOR:  Subjective history: hx of long-COVID and notes pervasive symptoms ever since  Non-Vestibular symptoms:  none  Type of dizziness: Spinning/Vertigo and Unsteady with head/body turns  Frequency: position change  Duration: 3-7 sec  Aggravating factors: Induced by position change: lying supine and supine to sit and Worse in the dark  Relieving factors: closing eyes and slow movements  Progression of symptoms: unchanged  OCULOMOTOR  EXAM:  Ocular Alignment: normal  Ocular ROM: No Limitations  Spontaneous Nystagmus: absent  Gaze-Induced Nystagmus: absent  Smooth Pursuits: intact  Saccades: hypometric/undershoots  Convergence/Divergence: 4 cm     VESTIBULAR - OCULAR REFLEX:   Slow VOR: Normal  VOR Cancellation: Normal  Head-Impulse Test: HIT Right: negative HIT Left: negative  Dynamic Visual Acuity: Not able to be assessed   POSITIONAL TESTING: Right Dix-Hallpike: no nystagmus and experiences spinning Left Dix-Hallpike: no nystagmus and minimal symptoms as compared to right Right Roll Test: no nystagmus and feels spinning 3-7 sec Left Roll Test: no nystagmus and less as compared to right  When transitioning Supine to sit: around 25-35 degrees elevation she feels most dramatic spinning sensation  MOTION SENSITIVITY:  Motion Sensitivity Quotient Intensity: 0 = none, 1 = Lightheaded, 2 = Mild, 3 = Moderate, 4 = Severe, 5 = Vomiting  Intensity  1. Sitting to supine   2. Supine to L side   3. Supine to R side   4. Supine to sitting   5. L Hallpike-Dix   6. Up from L    7. R Hallpike-Dix   8. Up from R    9. Sitting, head tipped to L knee   10. Head up from L knee   11. Sitting, head tipped to R knee   12. Head up from R knee   13. Sitting head turns x5   14.Sitting head nods x5   15. In stance, 180 turn to L    16. In stance, 180 turn to R     OTHOSTATICS: not done  FUNCTIONAL GAIT: Functional gait assessment: NT  M-CTSIB  Condition 1: Firm Surface, EO 30 Sec, Normal Sway  Condition 2: Firm Surface, EC 30 Sec, Mild Sway  Condition 3: Foam Surface, EO 30 Sec, Mild Sway  Condition 4: Foam Surface, EC 6 Sec, Severe Sway      GOALS: Goals reviewed with patient? Yes  SHORT TERM GOALS: Target date: 09/08/2022    Patient will be independent in HEP to improve functional outcomes Baseline: Goal status: MET   2.  Patient will be free of positional  vertigo to improve quality of life and reduce  unsteadiness and risk for falls Baseline: symptoms with arising from supine and right rolling; negative 09/27/22 Goal status: MET 09/27/22     LONG TERM GOALS: Target date: 11/08/2022    Demo improved vestibular function and safety in low light conditions per mild sway x 30 sec condition 4 M-CTSIB Baseline: 6 seconds, severe; 13 sec with mod-severe sway 09/27/22 Goal status: IN PROGRESS 09/27/22  2.  Manifest improved balance and low risk for falls per score 25/30 Functional Gait Assessment to improve safety with household and community mobility Baseline: 17/30 09/27/22 Goal status: IN PROGRESS 09/27/22  3. Patient to report tolerance for 10-15 minute walk without rest break with LRAD as needed for safety.  Baseline: tolerating 5 min  Goal status: IN PROGRESS  4.  Patient to report tolerance for cooking a meal without husband's help and without rest break required d/t fatigue or imbalance.   Baseline: tolerating 5 min  Goal status: IN PROGRESS       ASSESSMENT:  CLINICAL IMPRESSION: Gait training for improved cardiovascular endurance/tolerance using 6 minute walk test for comparison and pt able to traverse 920 ft during test and rates effort 13/20 RPE Borg scale with vitals monitored and demo 88% at end of trial with improvement to 93% after 40 sec seated rest period. Continued with dynamic and static balance activities to promote stability and proximal strength withstanding external resistance. Continued sessions to advance activities for improved ambulation tolerance and speed to return to PLOF as community-distance ambulator. Demo mild-mod sway condition 4 M-CTSIB x 30 sec  OBJECTIVE IMPAIRMENTS: Abnormal gait, decreased activity tolerance, decreased balance, decreased knowledge of use of DME, difficulty walking, and dizziness.   ACTIVITY LIMITATIONS: carrying, lifting, bending, standing, stairs, bathing, reach over head, and locomotion level  PARTICIPATION LIMITATIONS: meal prep,  laundry, driving, and community activity  PERSONAL FACTORS: Time since onset of injury/illness/exacerbation and 1 comorbidity: COVID, HTN  are also affecting patient's functional outcome.   REHAB POTENTIAL: Good  CLINICAL DECISION MAKING: Stable/uncomplicated  EVALUATION COMPLEXITY: Low   PLAN:  PT FREQUENCY: 2x/week  PT DURATION: 6 weeks  PLANNED INTERVENTIONS: Therapeutic exercises, Therapeutic activity, Neuromuscular re-education, Balance training, Gait training, Patient/Family education, Self Care, Joint mobilization, Stair training, Vestibular training, Canalith repositioning, DME instructions, Aquatic Therapy, Dry Needling, Electrical stimulation, Spinal mobilization, Cryotherapy, Moist heat, Taping, and Manual therapy  PLAN FOR NEXT SESSION: postural perturbations (throwing large physioball at her back for righting reaction) Spouse can assist/spot/throw :)  4:23 PM, 10/30/22 M. Sherlyn Lees, PT, DPT Physical Therapist- [place] Office Number: [phone]   [facility] [address], [place], [place] [address] Phone # [phone] Fax # [phone]

## 2022-11-01 ENCOUNTER — Ambulatory Visit: Payer: Medicare Other

## 2022-11-01 DIAGNOSIS — R2681 Unsteadiness on feet: Secondary | ICD-10-CM

## 2022-11-01 DIAGNOSIS — M6281 Muscle weakness (generalized): Secondary | ICD-10-CM

## 2022-11-01 DIAGNOSIS — R262 Difficulty in walking, not elsewhere classified: Secondary | ICD-10-CM

## 2022-11-01 DIAGNOSIS — R42 Dizziness and giddiness: Secondary | ICD-10-CM

## 2022-11-01 NOTE — Therapy (Signed)
OUTPATIENT PHYSICAL THERAPY VESTIBULAR NOTE     Patient Name: KHALEAH KREBS MRN: [id number] DOB:10/12/1945, 77 y.o., female Today's Date: 11/01/2022     END OF SESSION:  PT End of Session - 11/01/22 1533     Visit Number 17    Number of Visits 21    Date for PT Re-Evaluation 11/08/22    Authorization Type Medicare A & B    Progress Note Due on Visit 68    PT Start Time 1530    PT Stop Time 1615    PT Time Calculation (min) 45 min    Equipment Utilized During Treatment Gait belt    Activity Tolerance Patient tolerated treatment well;Patient limited by fatigue   fatigue, SOB   Behavior During Therapy WFL for tasks assessed/performed                        Past Medical History:  Diagnosis Date   Chronic kidney disease, stage 3a ([place]) 06/27/2021   COVID-19    GERD without esophagitis 06/27/2021   Hypertension    Mild intermittent asthma without complication 99991111   Mixed hyperlipidemia 06/27/2021   Pneumonia    Past Surgical History:  Procedure Laterality Date   TOTAL KNEE ARTHROPLASTY     Patient Active Problem List   Diagnosis Date Noted   COVID-19 virus infection 06/27/2021   Acute respiratory failure with hypoxia ([place]) 06/27/2021   Chronic kidney disease, stage 3a ([place]) 06/27/2021   GERD without esophagitis 06/27/2021   Mixed hyperlipidemia 06/27/2021   Mild intermittent asthma with (acute) exacerbation 06/27/2021   Sepsis due to COVID-19 ([place]) 06/27/2021    PCP: Maury Dus, MD REFERRING PROVIDER: Maury Dus, MD///Rachel Hagler, MD (Dr. Noland Fordyce replacement)  REFERRING DIAG: R42 (ICD-10-CM) - Dizziness and giddiness  THERAPY DIAG:  Unsteadiness on feet  Muscle weakness (generalized)  Difficulty in walking, not elsewhere classified  Dizziness and giddiness  ONSET DATE: July-August 2023  Rationale for Evaluation and Treatment: Rehabilitation  SUBJECTIVE:   SUBJECTIVE STATEMENT: Feeling ok today  Pt accompanied by:  significant other  PERTINENT HISTORY: Patient presents for assessment since fall at home yesterday. Patient has had intermittent episodes of dizziness and lightheadedness since having COVID and also being on amlodipine. Patient denies any syncope however she did hit her head. With intermittent dizziness today she wanted to get evaluated. Patient is on aspirin. No neurologic signs or symptoms. Patient had mild injury to the right arm no pain with movement however skin abrasion and put a Band-Aid on.  PAIN:  Are you having pain? No   PRECAUTIONS: Fall Per husband- "okay with numbers (vitals) going down as long as she recovers within 2 minutes."   WEIGHT BEARING RESTRICTIONS: No  FALLS: Has patient fallen in last 6 months? Yes. Number of falls 6-7  LIVING ENVIRONMENT: Lives with: lives with their spouse Lives in: House/apartment Stairs: Yes: Internal: does not use steps; bilateral but cannot reach both and External: 3-4 steps; on right going up Has following equipment at home: Walker - 4 wheeled, uses rollator at night  PLOF: Independent  PATIENT GOALS: eliminate symptoms  OBJECTIVE:   TODAY'S TREATMENT: 11/01/22 Activity Comments  -standing on foam -postural perturbations x 2 min--hit in the back by physioball -granny toss x 60 sec -sideways catch/toss physioball 1x10 -feet apart/together: EC x 30 sec -feet together EO/EC w/ head turns 3x  Vitals after activity 95%, 119 bpm --pt feeling symptomatic of SOB/incr HR  Dynamic standing balance Standing  in tandem sparring with spouse using pool noodles 4x 30 sec rounds  retrowalk 4x45 ft            TODAY'S TREATMENT: 10/30/22 Activity Comments  6 MWT 920 ft, RPE of 13/20, vitals at end 86% and 124 bpm (age-matched norms are 1545 ft)  Slow motion gait to promote single limb support Able to implement slight head turns for added balance challenge  Sit-stand 5x5, varied foot position for coordination challenge 15# KB hold  Standing on  foam -EO/EC x 30 sec -pass green med ball around body 5x ea direction -pass green med ball between legs 5x ea direction -one foot on foam, standing PNF chops             Vitals at start: 95%, 101 bpm Vitals at end:  93%, HR 115 bpm    TODAY'S TREATMENT: 10/25/2022 Activity Comments  Functional strengthening circuit: -sit<>stand -sit<>stand + march  -20 sec work, 10 sec rest  5 min period:  able to complete 6 rounds with brief rest periods, rates exertion level as 7-8/10.  HR 121  Strengthening circuit: -Minisquats to up on toes -Lateral weightshift with lift for SLS 20 sec work, 10 sec rest 3 minutes (at end of session, HR 139>129, O2 sats 92)  Balance circuit: Sidestepping Forward/back walking-min guard/min assist for backwards with several near LOB HR 167 bpm, O2 90%, with 2 minute rest  Gait with head turns, head nods 25 ft x 4 reps each, min guard/min assist Wider BOS and slowed pace     NMR:  Standing on solid surface feet apart, feet together EO/EC x 30 sec Standing on Airex EO/EC x 30 sec (EC 2 x 30 sec)  Moderate sway on EC on foam  At end of session:  forward lean>upright posture with deep breaths, 6 reps            HOME EXERCISE PROGRAM Last updated: 10/02/22 Access Code: [facility] URL: [email or web address] Date: 09/20/2022 Prepared by: [facility]  Program Notes walk 2-3 minutes in the house; monitor vitals and stop if spO2 130 bpm or if other symptoms occur. perform this 3x/day.   Exercises - Narrow Stance with Counter Support  - 1 x daily - 5 x weekly - 2-3 sets - 30 sec hold - Romberg Stance with Eyes Closed  - 1 x daily - 5 x weekly - 2-3 sets - 30 sec hold - Corner Balance Feet Apart: Eyes Closed With Head Turns  - 1 x daily - 5 x weekly - 2-3 sets - 30 sec hold - Wide Stance with Eyes Closed and Head Nods  - 1 x daily - 5 x weekly - 2-3 sets - 30 sec hold - Side Step Overs with Cones and Counter  Support  - 1 x daily - 5 x weekly - 2 sets - 10 reps - Figure-8 Walking Around Cones  - 1 x daily - 5 x weekly - 2 sets - 10 reps - Standing Toe Taps  - 1 x daily - 5 x weekly - 3 sets - 30 sec hold    Below measures were taken at time of initial evaluation unless otherwise specified:   DIAGNOSTIC FINDINGS: IMPRESSION: 1. No evidence of acute abnormality. 2. High posterior left scalp contusion without acute calvarial fracture.  COGNITION: Overall cognitive status: Within functional limits for tasks assessed   SENSATION: WFL  EDEMA:    MUSCLE TONE:    DTRs:  POSTURE:  No Significant postural limitations  Cervical ROM:    Active A/PROM (deg) eval  Flexion WNL  Extension WNL  Right lateral flexion WNL  Left lateral flexion WNL  Right rotation WNL  Left rotation WNL  (Blank rows = not tested)  Looking up brings on symptoms  STRENGTH:   LOWER EXTREMITY MMT:   MMT Right eval Left eval  Hip flexion    Hip abduction    Hip adduction    Hip internal rotation    Hip external rotation    Knee flexion    Knee extension    Ankle dorsiflexion    Ankle plantarflexion    Ankle inversion    Ankle eversion    (Blank rows = not tested)  BED MOBILITY:  independent  TRANSFERS: Assistive device utilized: None  Sit to stand: Complete Independence Stand to sit: Complete Independence Chair to chair: Complete Independence Floor:  DNT  RAMP:   CURB:   GAIT: Gait pattern: decreased stride length Distance walked:  Assistive device utilized: None Level of assistance: Complete Independence Comments: ambulates with short stride/increased double limb support time, somewhat tentative in her confidence for gait  FUNCTIONAL TESTS:    PATIENT SURVEYS:    VESTIBULAR ASSESSMENT:  GENERAL OBSERVATION: wears bifocals   SYMPTOM BEHAVIOR:  Subjective history: hx of long-COVID and notes pervasive symptoms ever since  Non-Vestibular symptoms:  none  Type of  dizziness: Spinning/Vertigo and Unsteady with head/body turns  Frequency: position change  Duration: 3-7 sec  Aggravating factors: Induced by position change: lying supine and supine to sit and Worse in the dark  Relieving factors: closing eyes and slow movements  Progression of symptoms: unchanged  OCULOMOTOR EXAM:  Ocular Alignment: normal  Ocular ROM: No Limitations  Spontaneous Nystagmus: absent  Gaze-Induced Nystagmus: absent  Smooth Pursuits: intact  Saccades: hypometric/undershoots  Convergence/Divergence: 4 cm     VESTIBULAR - OCULAR REFLEX:   Slow VOR: Normal  VOR Cancellation: Normal  Head-Impulse Test: HIT Right: negative HIT Left: negative  Dynamic Visual Acuity: Not able to be assessed   POSITIONAL TESTING: Right Dix-Hallpike: no nystagmus and experiences spinning Left Dix-Hallpike: no nystagmus and minimal symptoms as compared to right Right Roll Test: no nystagmus and feels spinning 3-7 sec Left Roll Test: no nystagmus and less as compared to right  When transitioning Supine to sit: around 25-35 degrees elevation she feels most dramatic spinning sensation  MOTION SENSITIVITY:  Motion Sensitivity Quotient Intensity: 0 = none, 1 = Lightheaded, 2 = Mild, 3 = Moderate, 4 = Severe, 5 = Vomiting  Intensity  1. Sitting to supine   2. Supine to L side   3. Supine to R side   4. Supine to sitting   5. L Hallpike-Dix   6. Up from L    7. R Hallpike-Dix   8. Up from R    9. Sitting, head tipped to L knee   10. Head up from L knee   11. Sitting, head tipped to R knee   12. Head up from R knee   13. Sitting head turns x5   14.Sitting head nods x5   15. In stance, 180 turn to L    16. In stance, 180 turn to R     OTHOSTATICS: not done  FUNCTIONAL GAIT: Functional gait assessment: NT  M-CTSIB  Condition 1: Firm Surface, EO 30 Sec, Normal Sway  Condition 2: Firm Surface, EC 30 Sec, Mild Sway  Condition 3: Foam Surface, EO 30  Sec, Mild Sway  Condition 4:  Foam Surface, EC 6 Sec, Severe Sway      GOALS: Goals reviewed with patient? Yes  SHORT TERM GOALS: Target date: 09/08/2022    Patient will be independent in HEP to improve functional outcomes Baseline: Goal status: MET   2.  Patient will be free of positional vertigo to improve quality of life and reduce unsteadiness and risk for falls Baseline: symptoms with arising from supine and right rolling; negative 09/27/22 Goal status: MET 09/27/22     LONG TERM GOALS: Target date: 11/08/2022    Demo improved vestibular function and safety in low light conditions per mild sway x 30 sec condition 4 M-CTSIB Baseline: 6 seconds, severe; 13 sec with mod-severe sway 09/27/22 Goal status: IN PROGRESS 09/27/22  2.  Manifest improved balance and low risk for falls per score 25/30 Functional Gait Assessment to improve safety with household and community mobility Baseline: 17/30 09/27/22 Goal status: IN PROGRESS 09/27/22  3. Patient to report tolerance for 10-15 minute walk without rest break with LRAD as needed for safety.  Baseline: tolerating 5 min  Goal status: IN PROGRESS  4.  Patient to report tolerance for cooking a meal without husband's help and without rest break required d/t fatigue or imbalance.   Baseline: tolerating 5 min  Goal status: IN PROGRESS       ASSESSMENT:  CLINICAL IMPRESSION: Session emphasis on reactive balance strategies with tasks designed to elicit ankle, hip, and stepping strategy against external forces/postural perturbations.  Delayed but present righting reactions with functional response 75% of time requiring physical assist to correct LOB the other 25%. Still exhibits quick onset of fatigue/SOB and elevated HR but O2 sats maintained > 90% throughout all of sessions and HR recovery quickly as well. Continued sessions to advance POC details and meet LTG  OBJECTIVE IMPAIRMENTS: Abnormal gait, decreased activity tolerance, decreased balance, decreased knowledge  of use of DME, difficulty walking, and dizziness.   ACTIVITY LIMITATIONS: carrying, lifting, bending, standing, stairs, bathing, reach over head, and locomotion level  PARTICIPATION LIMITATIONS: meal prep, laundry, driving, and community activity  PERSONAL FACTORS: Time since onset of injury/illness/exacerbation and 1 comorbidity: COVID, HTN  are also affecting patient's functional outcome.   REHAB POTENTIAL: Good  CLINICAL DECISION MAKING: Stable/uncomplicated  EVALUATION COMPLEXITY: Low   PLAN:  PT FREQUENCY: 2x/week  PT DURATION: 6 weeks  PLANNED INTERVENTIONS: Therapeutic exercises, Therapeutic activity, Neuromuscular re-education, Balance training, Gait training, Patient/Family education, Self Care, Joint mobilization, Stair training, Vestibular training, Canalith repositioning, DME instructions, Aquatic Therapy, Dry Needling, Electrical stimulation, Spinal mobilization, Cryotherapy, Moist heat, Taping, and Manual therapy  PLAN FOR NEXT SESSION: formal M-CTSIB test 3:34 PM, 11/01/22 M. Sherlyn Lees, PT, DPT Physical Therapist- [address] Office Number: [phone]   [place] [facility] [address], [place], [place] [address] Phone # [phone] Fax # [phone]

## 2022-11-06 ENCOUNTER — Encounter: Payer: Self-pay | Admitting: Physical Therapy

## 2022-11-06 ENCOUNTER — Ambulatory Visit: Payer: Medicare Other | Admitting: Physical Therapy

## 2022-11-06 DIAGNOSIS — R262 Difficulty in walking, not elsewhere classified: Secondary | ICD-10-CM

## 2022-11-06 DIAGNOSIS — M6281 Muscle weakness (generalized): Secondary | ICD-10-CM

## 2022-11-06 DIAGNOSIS — R42 Dizziness and giddiness: Secondary | ICD-10-CM

## 2022-11-06 DIAGNOSIS — R2681 Unsteadiness on feet: Secondary | ICD-10-CM

## 2022-11-07 ENCOUNTER — Encounter (INDEPENDENT_AMBULATORY_CARE_PROVIDER_SITE_OTHER): Payer: Medicare Other | Admitting: Ophthalmology

## 2022-11-07 DIAGNOSIS — H338 Other retinal detachments: Secondary | ICD-10-CM | POA: Diagnosis not present

## 2022-11-07 DIAGNOSIS — H4423 Degenerative myopia, bilateral: Secondary | ICD-10-CM | POA: Diagnosis not present

## 2022-11-07 DIAGNOSIS — H43813 Vitreous degeneration, bilateral: Secondary | ICD-10-CM

## 2022-11-07 NOTE — Therapy (Addendum)
OUTPATIENT PHYSICAL THERAPY VESTIBULAR NOTE     Patient Name: Tracy Young MRN: [id number] DOB:01/22/1946, 77 y.o., female Today's Date: 11/08/2022  Progress Note Reporting Period 10/02/22 to 11/08/22  See note below for Objective Data and Assessment of Progress/Goals.       END OF SESSION:  PT End of Session - 11/08/22 1706     Visit Number 19    Number of Visits 21    Date for PT Re-Evaluation 11/08/22    Authorization Type Medicare A & B    Progress Note Due on Visit 49    PT Start Time [id number]    PT Stop Time 1619    PT Time Calculation (min) 44 min    Equipment Utilized During Treatment Gait belt    Activity Tolerance Patient tolerated treatment well   fatigue, SOB   Behavior During Therapy WFL for tasks assessed/performed                         Past Medical History:  Diagnosis Date   Chronic kidney disease, stage 3a ([place]) 06/27/2021   COVID-19    GERD without esophagitis 06/27/2021   Hypertension    Mild intermittent asthma without complication [id number]   Mixed hyperlipidemia 06/27/2021   Pneumonia    Past Surgical History:  Procedure Laterality Date   TOTAL KNEE ARTHROPLASTY     Patient Active Problem List   Diagnosis Date Noted   COVID-19 virus infection 06/27/2021   Acute respiratory failure with hypoxia ([place]) 06/27/2021   Chronic kidney disease, stage 3a ([place]) 06/27/2021   GERD without esophagitis 06/27/2021   Mixed hyperlipidemia 06/27/2021   Mild intermittent asthma with (acute) exacerbation 06/27/2021   Sepsis due to COVID-19 ([place]) 06/27/2021    PCP: Maury Dus, MD REFERRING PROVIDER: Maury Dus, MD  REFERRING DIAG: R42 (ICD-10-CM) - Dizziness and giddiness  THERAPY DIAG:  Unsteadiness on feet  Muscle weakness (generalized)  Difficulty in walking, not elsewhere classified  Dizziness and giddiness  ONSET DATE: July-August 2023  Rationale for Evaluation and Treatment: Rehabilitation  SUBJECTIVE:    SUBJECTIVE STATEMENT: Doing well. Sometimes has L toe pain at night- none now.  Pt accompanied by: significant other  PERTINENT HISTORY: Patient presents for assessment since fall at home yesterday. Patient has had intermittent episodes of dizziness and lightheadedness since having COVID and also being on amlodipine. Patient denies any syncope however she did hit her head. With intermittent dizziness today she wanted to get evaluated. Patient is on aspirin. No neurologic signs or symptoms. Patient had mild injury to the right arm no pain with movement however skin abrasion and put a Band-Aid on.  PAIN:  Are you having pain? No   PRECAUTIONS: Fall Per husband- MD "okay with numbers (vitals) going down as long as she recovers within 2 minutes."   WEIGHT BEARING RESTRICTIONS: No  FALLS: Has patient fallen in last 6 months? Yes. Number of falls 6-7  LIVING ENVIRONMENT: Lives with: lives with their spouse Lives in: House/apartment Stairs: Yes: Internal: does not use steps; bilateral but cannot reach both and External: 3-4 steps; on right going up Has following equipment at home: Walker - 4 wheeled, uses rollator at night  PLOF: Independent  PATIENT GOALS: eliminate symptoms  OBJECTIVE:      TODAY'S TREATMENT: 11/08/22 Activity Comments  Vitals at start of session  95%, 105 bpm   FGA 17  M-CTSIB  Condition 1: Firm Surface, EO 30 Sec, Normal Sway  Condition 2: Firm Surface, EC 30 Sec, Moderate Sway  Condition 3: Foam Surface, EO 30 Sec, Mild Sway  Condition 4: Foam Surface, EC 15 Sec, Severe and required mod-max A to recover  Sway     [facility] PT Assessment - 11/08/22 0001       Functional Gait  Assessment   Gait Level Surface Walks 20 ft in less than 5.5 sec, no assistive devices, good speed, no evidence for imbalance, normal gait pattern, deviates no more than 6 in outside of the 12 in walkway width.    Change in Gait Speed Able to change speed,  demonstrates mild gait deviations, deviates 6-10 in outside of the 12 in walkway width, or no gait deviations, unable to achieve a major change in velocity, or uses a change in velocity, or uses an assistive device.    Gait with Horizontal Head Turns Performs head turns smoothly with slight change in gait velocity (eg, minor disruption to smooth gait path), deviates 6-10 in outside 12 in walkway width, or uses an assistive device.    Gait with Vertical Head Turns Performs task with moderate change in gait velocity, slows down, deviates 10-15 in outside 12 in walkway width but recovers, can continue to walk.    Gait and Pivot Turn Turns slowly, requires verbal cueing, or requires several small steps to catch balance following turn and stop    Step Over Obstacle Is able to step over one shoe box (4.5 in total height) but must slow down and adjust steps to clear box safely. May require verbal cueing.    Gait with Narrow Base of Support Ambulates less than 4 steps heel to toe or cannot perform without assistance.    Gait with Eyes Closed Walks 20 ft, no assistive devices, good speed, no evidence of imbalance, normal gait pattern, deviates no more than 6 in outside 12 in walkway width. Ambulates 20 ft in less than 7 sec.    Ambulating Backwards Walks 20 ft, uses assistive device, slower speed, mild gait deviations, deviates 6-10 in outside 12 in walkway width.    Steps Alternating feet, must use rail.    Total Score 17              PATIENT EDUCATION: Education details: discussion on objective and functional progress; encouraged to try stair navigation at home/don't avoid it and use it to increase endurance and confidence with husband's supervision; discussed options such as personal trainer or working out with husband to have someone with her during exercise for safety; provided contact info for [facility] d/t pt's c/o LBP limiting standing tolerance and N/T in [place]; edu on 30 day  hold Person educated: Patient and Spouse Education method: Explanation, Demonstration, Tactile cues, Verbal cues, and Handouts Education comprehension: verbalized understanding and returned demonstration    HOME EXERCISE PROGRAM Last updated: 10/02/22 Access Code: [facility] URL: [email or web address] Date: 09/20/2022 Prepared by: [facility]  Program Notes walk 2-3 minutes in the house; monitor vitals and stop if spO2 130 bpm or if other symptoms occur. perform this 3x/day.   Exercises - Narrow Stance with Counter Support  - 1 x daily - 5 x weekly - 2-3 sets - 30 sec hold - Romberg Stance with Eyes Closed  - 1 x daily - 5 x weekly - 2-3 sets - 30 sec hold - Corner Balance Feet Apart: Eyes Closed  With Head Turns  - 1 x daily - 5 x weekly - 2-3 sets - 30 sec hold - Wide Stance with Eyes Closed and Head Nods  - 1 x daily - 5 x weekly - 2-3 sets - 30 sec hold - Side Step Overs with Cones and Counter Support  - 1 x daily - 5 x weekly - 2 sets - 10 reps - Figure-8 Walking Around Cones  - 1 x daily - 5 x weekly - 2 sets - 10 reps - Standing Toe Taps  - 1 x daily - 5 x weekly - 3 sets - 30 sec hold    Below measures were taken at time of initial evaluation unless otherwise specified:   DIAGNOSTIC FINDINGS: IMPRESSION: 1. No evidence of acute abnormality. 2. High posterior left scalp contusion without acute calvarial fracture.  COGNITION: Overall cognitive status: Within functional limits for tasks assessed   SENSATION: WFL  EDEMA:    MUSCLE TONE:    DTRs:    POSTURE:  No Significant postural limitations  Cervical ROM:    Active A/PROM (deg) eval  Flexion WNL  Extension WNL  Right lateral flexion WNL  Left lateral flexion WNL  Right rotation WNL  Left rotation WNL  (Blank rows = not tested)  Looking up brings on symptoms  STRENGTH:   LOWER EXTREMITY MMT:   MMT Right eval Left eval  Hip flexion    Hip  abduction    Hip adduction    Hip internal rotation    Hip external rotation    Knee flexion    Knee extension    Ankle dorsiflexion    Ankle plantarflexion    Ankle inversion    Ankle eversion    (Blank rows = not tested)  BED MOBILITY:  independent  TRANSFERS: Assistive device utilized: None  Sit to stand: Complete Independence Stand to sit: Complete Independence Chair to chair: Complete Independence Floor:  DNT  RAMP:   CURB:   GAIT: Gait pattern: decreased stride length Distance walked:  Assistive device utilized: None Level of assistance: Complete Independence Comments: ambulates with short stride/increased double limb support time, somewhat tentative in her confidence for gait  FUNCTIONAL TESTS:    PATIENT SURVEYS:    VESTIBULAR ASSESSMENT:  GENERAL OBSERVATION: wears bifocals   SYMPTOM BEHAVIOR:  Subjective history: hx of long-COVID and notes pervasive symptoms ever since  Non-Vestibular symptoms:  none  Type of dizziness: Spinning/Vertigo and Unsteady with head/body turns  Frequency: position change  Duration: 3-7 sec  Aggravating factors: Induced by position change: lying supine and supine to sit and Worse in the dark  Relieving factors: closing eyes and slow movements  Progression of symptoms: unchanged  OCULOMOTOR EXAM:  Ocular Alignment: normal  Ocular ROM: No Limitations  Spontaneous Nystagmus: absent  Gaze-Induced Nystagmus: absent  Smooth Pursuits: intact  Saccades: hypometric/undershoots  Convergence/Divergence: 4 cm     VESTIBULAR - OCULAR REFLEX:   Slow VOR: Normal  VOR Cancellation: Normal  Head-Impulse Test: HIT Right: negative HIT Left: negative  Dynamic Visual Acuity: Not able to be assessed   POSITIONAL TESTING: Right Dix-Hallpike: no nystagmus and experiences spinning Left Dix-Hallpike: no nystagmus and minimal symptoms as compared to right Right Roll Test: no nystagmus and feels spinning 3-7 sec Left Roll Test: no  nystagmus and less as compared to right  When transitioning Supine to sit: around 25-35 degrees elevation she feels most dramatic spinning sensation  MOTION SENSITIVITY:  Motion Sensitivity Quotient Intensity: 0 = none, 1 =  Lightheaded, 2 = Mild, 3 = Moderate, 4 = Severe, 5 = Vomiting  Intensity  1. Sitting to supine   2. Supine to L side   3. Supine to R side   4. Supine to sitting   5. L Hallpike-Dix   6. Up from L    7. R Hallpike-Dix   8. Up from R    9. Sitting, head tipped to L knee   10. Head up from L knee   11. Sitting, head tipped to R knee   12. Head up from R knee   13. Sitting head turns x5   14.Sitting head nods x5   15. In stance, 180 turn to L    16. In stance, 180 turn to R     OTHOSTATICS: not done  FUNCTIONAL GAIT: Functional gait assessment: NT  M-CTSIB  Condition 1: Firm Surface, EO 30 Sec, Normal Sway  Condition 2: Firm Surface, EC 30 Sec, Mild Sway  Condition 3: Foam Surface, EO 30 Sec, Mild Sway  Condition 4: Foam Surface, EC 6 Sec, Severe Sway      GOALS: Goals reviewed with patient? Yes  SHORT TERM GOALS: Target date: 09/08/2022    Patient will be independent in HEP to improve functional outcomes Baseline: Goal status: MET   2.  Patient will be free of positional vertigo to improve quality of life and reduce unsteadiness and risk for falls Baseline: symptoms with arising from supine and right rolling; negative 09/27/22 Goal status: MET 09/27/22     LONG TERM GOALS: Target date: 11/08/2022    Demo improved vestibular function and safety in low light conditions per mild sway x 30 sec condition 4 M-CTSIB Baseline: 6 seconds, severe; 13 sec with mod-severe sway 09/27/22; 15 sec 11/08/22 Goal status: IN PROGRESS 11/08/22  2.  Manifest improved balance and low risk for falls per score 25/30 Functional Gait Assessment to improve safety with household and community mobility Baseline: 17/30 09/27/22; 17/30 11/08/22 Goal status: IN PROGRESS  11/08/22  3. Patient to report tolerance for 10-15 minute walk without rest break with LRAD as needed for safety.  Baseline: tolerating 5 min; pt reports tolerance for 15-20 min shopping trip before needing to sit (reports usually sits in chair near pharmacy) Goal status: IN PROGRESS 11/08/22  4.  Patient to report tolerance for cooking a meal without husband's help and without rest break required d/t fatigue or imbalance.   Baseline: tolerating 5 min; husband reports pt is now able to fix a quick meal like cereal- pt reports having to sit d/t LBP  Goal status: IN PROGRESS 11/08/22      ASSESSMENT:  CLINICAL IMPRESSION: Patient arrived to session without complaints. Multisensory balance testing revealed improved stability on MCTSIB condition 1-3, very slightly improved on condition #4. Patient scored 17/30 on FGA; unchanged from last measurement. Patient reports tolerance for 15-20 min of walking at grocery store with cart, but limited to standing for a few minutes to prepare a quick meal d/t LBP.  Provided relevant edu on spine specialist as patient also reports N/T in LEs and educated on safe return to gym. At this point patient would like to try a 30 day hold to exercise at the gym.   OBJECTIVE IMPAIRMENTS: Abnormal gait, decreased activity tolerance, decreased balance, decreased knowledge of use of DME, difficulty walking, and dizziness.   ACTIVITY LIMITATIONS: carrying, lifting, bending, standing, stairs, bathing, reach over head, and locomotion level  PARTICIPATION LIMITATIONS: meal prep, laundry, driving, and community  activity  PERSONAL FACTORS: Time since onset of injury/illness/exacerbation and 1 comorbidity: COVID, HTN  are also affecting patient's functional outcome.   REHAB POTENTIAL: Good  CLINICAL DECISION MAKING: Stable/uncomplicated  EVALUATION COMPLEXITY: Low   PLAN:  PT FREQUENCY: 2x/week  PT DURATION: 6 weeks  PLANNED INTERVENTIONS: Therapeutic exercises,  Therapeutic activity, Neuromuscular re-education, Balance training, Gait training, Patient/Family education, Self Care, Joint mobilization, Stair training, Vestibular training, Canalith repositioning, DME instructions, Aquatic Therapy, Dry Needling, Electrical stimulation, Spinal mobilization, Cryotherapy, Moist heat, Taping, and Manual therapy  PLAN FOR NEXT SESSION: 30 day hold at this time   Anette Guarneri, PT, DPT 11/08/22 5:13 PM  [facility] at [facility] [address] [place], [place] [address] Phone # [phone] Fax # [phone]    PHYSICAL THERAPY DISCHARGE SUMMARY  Visits from Start of Care: 19  Current functional level related to goals / functional outcomes: See above; patient did not need to return during 30 day hold   Remaining deficits: Reduced balance and functional activity tolerance   Education / Equipment: HEP  Plan: Patient agrees to discharge.  Patient goals were partially  met. Patient is being discharged per her request.    Anette Guarneri, PT, DPT 03/09/23 10:46 AM  [facility] at [facility] [address] [place], [address] [place], [place] [address] Phone # [phone] Fax # [phone]

## 2022-11-08 ENCOUNTER — Encounter: Payer: Self-pay | Admitting: Physical Therapy

## 2022-11-08 ENCOUNTER — Ambulatory Visit: Payer: Medicare Other | Admitting: Physical Therapy

## 2022-11-08 DIAGNOSIS — R42 Dizziness and giddiness: Secondary | ICD-10-CM

## 2022-11-08 DIAGNOSIS — R262 Difficulty in walking, not elsewhere classified: Secondary | ICD-10-CM

## 2022-11-08 DIAGNOSIS — R2681 Unsteadiness on feet: Secondary | ICD-10-CM | POA: Diagnosis not present

## 2022-11-08 DIAGNOSIS — M6281 Muscle weakness (generalized): Secondary | ICD-10-CM

## 2023-01-24 ENCOUNTER — Other Ambulatory Visit: Payer: Self-pay | Admitting: Family Medicine

## 2023-01-24 DIAGNOSIS — Z1231 Encounter for screening mammogram for malignant neoplasm of breast: Secondary | ICD-10-CM

## 2023-01-24 DIAGNOSIS — M8580 Other specified disorders of bone density and structure, unspecified site: Secondary | ICD-10-CM

## 2023-02-05 ENCOUNTER — Other Ambulatory Visit: Payer: Self-pay | Admitting: Pulmonary Disease

## 2023-02-16 ENCOUNTER — Ambulatory Visit
Admission: RE | Admit: 2023-02-16 | Discharge: 2023-02-16 | Disposition: A | Discharge status: Home / Self Care | Payer: Medicare Other | Source: Ambulatory Visit | Attending: Family Medicine | Admitting: Family Medicine

## 2023-02-16 DIAGNOSIS — Z1231 Encounter for screening mammogram for malignant neoplasm of breast: Secondary | ICD-10-CM

## 2023-03-03 ENCOUNTER — Other Ambulatory Visit: Payer: Self-pay | Admitting: Cardiovascular Disease

## 2023-05-12 ENCOUNTER — Other Ambulatory Visit: Payer: Self-pay | Admitting: Cardiovascular Disease

## 2023-05-17 ENCOUNTER — Telehealth: Payer: Self-pay | Admitting: Hematology

## 2023-06-15 ENCOUNTER — Inpatient Hospital Stay: Payer: Medicare Other | Attending: Hematology

## 2023-06-15 ENCOUNTER — Inpatient Hospital Stay: Payer: Medicare Other | Admitting: Hematology

## 2023-06-15 VITALS — BP 134/72 | HR 106 | Temp 98.4°F | Resp 15 | Ht 64.0 in | Wt 164.8 lb

## 2023-06-15 DIAGNOSIS — Z96653 Presence of artificial knee joint, bilateral: Secondary | ICD-10-CM | POA: Insufficient documentation

## 2023-06-15 DIAGNOSIS — Z7722 Contact with and (suspected) exposure to environmental tobacco smoke (acute) (chronic): Secondary | ICD-10-CM | POA: Diagnosis not present

## 2023-06-15 DIAGNOSIS — D849 Immunodeficiency, unspecified: Secondary | ICD-10-CM

## 2023-06-15 DIAGNOSIS — E782 Mixed hyperlipidemia: Secondary | ICD-10-CM | POA: Insufficient documentation

## 2023-06-15 DIAGNOSIS — G629 Polyneuropathy, unspecified: Secondary | ICD-10-CM | POA: Diagnosis not present

## 2023-06-15 DIAGNOSIS — J4520 Mild intermittent asthma, uncomplicated: Secondary | ICD-10-CM | POA: Diagnosis not present

## 2023-06-15 DIAGNOSIS — Z87440 Personal history of urinary (tract) infections: Secondary | ICD-10-CM | POA: Insufficient documentation

## 2023-06-15 DIAGNOSIS — D72829 Elevated white blood cell count, unspecified: Secondary | ICD-10-CM

## 2023-06-15 DIAGNOSIS — D729 Disorder of white blood cells, unspecified: Secondary | ICD-10-CM

## 2023-06-15 DIAGNOSIS — Z800 Family history of malignant neoplasm of digestive organs: Secondary | ICD-10-CM | POA: Diagnosis not present

## 2023-06-15 DIAGNOSIS — K219 Gastro-esophageal reflux disease without esophagitis: Secondary | ICD-10-CM | POA: Insufficient documentation

## 2023-06-15 DIAGNOSIS — D72821 Monocytosis (symptomatic): Secondary | ICD-10-CM

## 2023-06-15 DIAGNOSIS — N1831 Chronic kidney disease, stage 3a: Secondary | ICD-10-CM | POA: Insufficient documentation

## 2023-06-15 LAB — CBC WITH DIFFERENTIAL (CANCER CENTER ONLY)
Abs Immature Granulocytes: 0.08 10*3/uL — ABNORMAL HIGH (ref 0.00–0.07)
Basophils Absolute: 0.1 10*3/uL (ref 0.0–0.1)
Basophils Relative: 1 %
Eosinophils Absolute: 0.5 10*3/uL (ref 0.0–0.5)
Eosinophils Relative: 3 %
HCT: 37.4 % (ref 36.0–46.0)
Hemoglobin: 12.3 g/dL (ref 12.0–15.0)
Immature Granulocytes: 1 %
Lymphocytes Relative: 21 %
Lymphs Abs: 3.2 10*3/uL (ref 0.7–4.0)
MCH: 30.8 pg (ref 26.0–34.0)
MCHC: 32.9 g/dL (ref 30.0–36.0)
MCV: 93.5 fL (ref 80.0–100.0)
Monocytes Absolute: 1.6 10*3/uL — ABNORMAL HIGH (ref 0.1–1.0)
Monocytes Relative: 11 %
Neutro Abs: 9.7 10*3/uL — ABNORMAL HIGH (ref 1.7–7.7)
Neutrophils Relative %: 63 %
Platelet Count: 278 10*3/uL (ref 150–400)
RBC: 4 MIL/uL (ref 3.87–5.11)
RDW: 12.4 % (ref 11.5–15.5)
WBC Count: 15.1 10*3/uL — ABNORMAL HIGH (ref 4.0–10.5)
nRBC: 0 % (ref 0.0–0.2)

## 2023-06-15 LAB — CMP (CANCER CENTER ONLY)
ALT: 16 U/L (ref 0–44)
AST: 17 U/L (ref 15–41)
Albumin: 4.5 g/dL (ref 3.5–5.0)
Alkaline Phosphatase: 63 U/L (ref 38–126)
Anion gap: 7 (ref 5–15)
BUN: 18 mg/dL (ref 8–23)
CO2: 27 mmol/L (ref 22–32)
Calcium: 10 mg/dL (ref 8.9–10.3)
Chloride: 104 mmol/L (ref 98–111)
Creatinine: 1.03 mg/dL — ABNORMAL HIGH (ref 0.44–1.00)
GFR, Estimated: 57 mL/min — ABNORMAL LOW (ref >60)
Glucose, Bld: 96 mg/dL (ref 70–99)
Potassium: 4.2 mmol/L (ref 3.5–5.1)
Sodium: 138 mmol/L (ref 135–145)
Total Bilirubin: 0.3 mg/dL (ref 0.3–1.2)
Total Protein: 7 g/dL (ref 6.5–8.1)

## 2023-06-15 LAB — SEDIMENTATION RATE: Sed Rate: 4 mm/h (ref 0–22)

## 2023-06-15 NOTE — Progress Notes (Signed)
HEMATOLOGY/ONCOLOGY CONSULTATION NOTE  Date of Service: 06/15/2023  Patient Care Team: Elias Else, MD (Inactive) as PCP - General (Family Medicine)  CHIEF COMPLAINTS/PURPOSE OF CONSULTATION:  Evaluation and management of leukocytosis  HISTORY OF PRESENTING ILLNESS:   Tracy Young is a wonderful 76 y.o. female who has been referred to [place] by Aliene Beams, MD for evaluation and management of leukocytosis.   He was seen by Malachy Mood, MD on 03/31/2022 for leukocytosis with predominant neutropenia. He suspected that her leukocytosis was a reactive process, secondary to her asthma,allergy, and infections including bronchitis.   Today, she is accompanied by her husband. She reports that she was infected with COVID-19 in October 2022, and her WBCs had been consistently elevated since then. Her infection caused her to be hospitalized for 11 days and she was discharged with oxygen. Her husband reports that x-ray at the ER showed findings of COVID-19 and viral pneumonia. 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 She does not require home oxygen at this time. Patient had previously received the original two COVID-19 vaccines and COVID-19 booster vaccine.   Patient has endorsed balance issues and dizziness since her COVID-19 infection. She has had 6-7 falls within 3 months. Patient did have a head injury with contact to concrete on one occasion and her husband reports that there were no concerns for concussion based on scans. Since involvement with vestibular therapy, her balance issues have improved. She does regularly use a rolling walker inside the home at night.   She reports having frequent intermittent UTIs occurring every 4-5 months. She reports that she did have frequent UTIs in her 39s and 89s as well. Patient notes that she was previously put on a low-grade antibiotic for a year by a urologist and she did not have UTIs while on it. Her husband reports  that her urine culture was negative the last time it was checked.  Patient reports having frequent bronchitis, sinus infections, and respiratory infections for several years. She notes that during her preschool-age she did endorse bronchitis frequently. Patient continues to regularly use an inhaler. She does not take calcium tablets at this time.   Patient reports previous constipation lasting 1 week. She notes splashing when passing stools. Her bowel habits have been normal over the last month. Her husband notes that certain medications that she is taking may have caused her previous constipation.   She was previously on Remeron for menopausal symptoms. Patient began taking it at age 25 and stopped at age 89 in 53. She notes that she was hospitalized potentially due to an issue caused by her estrogen.  Her objective exercise tolerance has returned to baseline. She is able to use an exercise bike for 30 minutes and lift weights for 15 minutes regularly.   She complains of occasional sudden lower back spasms, causing leg weakness when walking, which eventually resolves. 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   She does endorse fatigue and bruises easily in her arms.. No unresolved dental issues, unusual rashes, unexplained painful swollen joints, unexplained new bone pain, skin rashes, abdominal pain, leg swelling, new lumps/bumps, or enlarged liver/spleen. Patient reports some neuropathy.  Patient is a never smoker. She has received second-hand exposure from her grandfather who raised her during her childhood. 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 She denies any other chemical exposure.   Patient denies having any polycystic  ovarian syndrome, uterine prolapse, kidney stones, recent surgeries, IBS, urinary incompeteance, cystoceles, uterine prolapse, or atrophic vaginitis. No fhx of autoimmune conditions such as lupus,  RA, or thyroid issues. No fhx of immune deficiency syndromes. Patient has one child.   She did previously have double knee replacement surgery in 2012 and takes antibiotics before dental procedures.  MEDICAL HISTORY:  Past Medical History:  Diagnosis Date   Chronic kidney disease, stage 3a (HCC) 06/27/2021   COVID-19    GERD without esophagitis 06/27/2021   Hypertension    Mild intermittent asthma without complication 06/27/2021   Mixed hyperlipidemia 06/27/2021   Pneumonia     SURGICAL HISTORY: Past Surgical History:  Procedure Laterality Date   TOTAL KNEE ARTHROPLASTY      SOCIAL HISTORY: Social History   Socioeconomic History   Marital status: Married    Spouse name: Not on file   Number of children: 4   Years of education: Not on file   Highest education level: Not on file  Occupational History   Occupation: Retired Audiological scientist  Tobacco Use   Smoking status: Never    Passive exposure: Never   Smokeless tobacco: Never  Vaping Use   Vaping status: Never Used  Substance and Sexual Activity   Alcohol use: Never   Drug use: Never   Sexual activity: Not on file  Other Topics Concern   Not on file  Social History Narrative   Not on file   Social Determinants of Health   Financial Resource Strain: Not on file  Food Insecurity: Not on file  Transportation Needs: Not on file  Physical Activity: Not on file  Stress: Not on file  Social Connections: Not on file  Intimate Partner Violence: Not on file    FAMILY HISTORY: Family History  Problem Relation Age of Onset   Cancer Mother 49       pancreatic cancer   Heart attack Father    Heart disease Neg Hx    Breast cancer Neg Hx     ALLERGIES:  is allergic to iodine stainless [iodine] and doxycycline.  MEDICATIONS:  Current Outpatient Medications  Medication Sig Dispense Refill   albuterol (VENTOLIN HFA) 108 (90 Base) MCG/ACT inhaler Inhale 2 puffs into the lungs every 4 (four) hours as needed for wheezing  or shortness of breath. 8.5 g 1   Ascorbic Acid (VITAMIN C) 1000 MG tablet Take 1,000 mg by mouth every morning.     buPROPion (WELLBUTRIN XL) 300 MG 24 hr tablet Take 300 mg by mouth daily.     Cholecalciferol (VITAMIN D-3) 125 MCG (5000 UT) TABS Take 5,000 Units by mouth every morning.     Cyanocobalamin (VITAMIN B-12 PO) Take 1 tablet by mouth every morning.     DULoxetine (CYMBALTA) 60 MG capsule Take 60 mg by mouth at bedtime.     famotidine (PEPCID) 40 MG tablet Take 40 mg by mouth at bedtime.     losartan (COZAAR) 25 MG tablet TAKE ONE TABLET BY MOUTH EVERY MORNING AND TAKE ONE TABLET BY MOUTH EVERY NIGHT AT BEDTIME 180 tablet 0   Multiple Minerals-Vitamins (CALCIUM CITRATE-MAG-MINERALS) TABS Take 1 tablet by mouth every morning. Calcium Citrate plus magnesium and zinc     NYAMYC powder Apply 1 application topically daily. Apply to legs after showering     pantoprazole (PROTONIX) 40 MG tablet Take 40 mg by mouth every morning.     raloxifene (EVISTA) 60 MG tablet Take 60 mg by mouth daily.  rosuvastatin (CRESTOR) 20 MG tablet TAKE 1 TABLET BY MOUTH DAILY 90 tablet 3   TRELEGY ELLIPTA 200-62.5-25 MCG/ACT AEPB INHALE 1 PUFF BY MOUTH DAILY 60 each 5   No current facility-administered medications for this visit.    REVIEW OF SYSTEMS:    10 Point review of Systems was done is negative except as noted above.  PHYSICAL EXAMINATION: ECOG PERFORMANCE STATUS: 1 - Symptomatic but completely ambulatory  . Vitals:   06/15/23 1437 06/15/23 1439  BP: (!) 145/80 134/72  Pulse: (!) 106   Resp: 15   Temp: 98.4 F (36.9 C)   SpO2: 97%    Filed Weights   06/15/23 1437  Weight: 164 lb 12.8 oz (74.8 kg)   .Body mass index is 28.29 kg/m.  GENERAL:alert, in no acute distress and comfortable SKIN: no acute rashes, no significant lesions EYES: conjunctiva are pink and non-injected, sclera anicteric OROPHARYNX: MMM, no exudates, no oropharyngeal erythema or ulceration NECK: supple, no  JVD LYMPH:  no palpable lymphadenopathy in the cervical, axillary or inguinal regions LUNGS: clear to auscultation b/l with normal respiratory effort HEART: regular rate & rhythm ABDOMEN:  normoactive bowel sounds , non tender, not distended. Extremity: no pedal edema PSYCH: alert & oriented x 3 with fluent speech NEURO: no focal motor/sensory deficits  LABORATORY DATA:  I have reviewed the data as listed  .    Latest Ref Rng & Units 06/15/2023    3:40 PM 03/31/2022   11:54 AM 07/03/2021    1:20 AM  CBC  WBC 4.0 - 10.5 K/uL 15.1  18.4  17.7   Hemoglobin 12.0 - 15.0 g/dL 09.8  11.9  14.7   Hematocrit 36.0 - 46.0 % 37.4  34.5  33.5   Platelets 150 - 400 K/uL 278  258  307     .    Latest Ref Rng & Units 06/15/2023    3:40 PM 07/03/2021    1:20 AM 07/02/2021    2:10 AM  CMP  Glucose 70 - 99 mg/dL 96  829  562   BUN 8 - 23 mg/dL 18  17  17    Creatinine 0.44 - 1.00 mg/dL 1.30  8.65  7.84   Sodium 135 - 145 mmol/L 138  134  131   Potassium 3.5 - 5.1 mmol/L 4.2  4.2  4.3   Chloride 98 - 111 mmol/L 104  102  100   CO2 22 - 32 mmol/L 27  23  24    Calcium 8.9 - 10.3 mg/dL 69.6  8.8  8.8   Total Protein 6.5 - 8.1 g/dL 7.0   5.4   Total Bilirubin 0.3 - 1.2 mg/dL 0.3   0.7   Alkaline Phos 38 - 126 U/L 63   70   AST 15 - 41 U/L 17   24   ALT 0 - 44 U/L 16   41     CBC 05/11/2023:     BCR/ABL 04/01/2023:   RADIOGRAPHIC STUDIES: I have personally reviewed the radiological images as listed and agreed with the findings in the report. No results found.  ASSESSMENT & PLAN:   77 y.o. female with   Elevated WBCs, specifically increased neutrophils and persistent elevation of monocytes Recurrent UTIs every 4-6 months Frequent respiratory infections including bronchitis and sinusitis  PLAN:  -CBC from 05/11/2023 showed WBC of 15.7K, hemoglobin of 11.3, and platelets of 290K. -BCR/ABL on 03/31/2022 showed no evidence of BCR/ABL management -patient does have a pattern of  increased neutrophils,  persistent elevation of monocytes, and a few immature WBCs -there is no effect on patient's other blood cells at this time -discussed potential reasons why each type of WBC may be elevated -discussed goal to determine whether her elevated WBCs are part of a reactive process to a stimulus, casing inflammation or recurrent infections, or if it is part of a clonal process, in which there is a change in the bone marrow causing excessive WBC production -patient continues to have recurrent UTIs, occurring every 4-6 months.  -patient has no other localizing symptoms besides her recurrent sinus infection, bronchitis, and UTIs.  -recommend patient to wear loose-fitted clothing, empty the bladder on a regular schedule, stay well hydrated, consume cranberry extract/juice, and eat fresh fruits and vegetables to help with recurrent UTIs -discussed that based on urine cultures, there may be an option to take antibiotics after sexual intercourse to potentially prevent recurrent infections -there may be persistent monocytosis  -not likely acute leukemia based on timeline -will check antibody levels to ensure there are no inherited antibody disorders -if molecular markers suggest a clonal process, there may be a role for a bone marrow examination -there may be a role for a bone marrow biopsy to rule out CMML -Patient's last urology evaluation was in the 1990s. Would recommend patient to connect with PCP to consider referral to urology for further evaluation and management of recurrent UTIs.  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 Orders Placed This Encounter  Procedures   CBC with Differential (Cancer Center Only)    Standing Status:   Future    Number of Occurrences:   1    Standing Expiration Date:   06/14/2024   CMP (Cancer Center only)    Standing Status:   Future    Number of  Occurrences:   1    Standing Expiration Date:   06/14/2024   Multiple Myeloma Panel (SPEP&IFE w/QIG)    Standing Status:   Future    Number of Occurrences:   1    Standing Expiration Date:   06/14/2024   Kappa/lambda light chains    Standing Status:   Future    Number of Occurrences:   1    Standing Expiration Date:   06/14/2024   Myeloid Panel NGS    Standing Status:   Future    Number of Occurrences:   1    Standing Expiration Date:   06/14/2024   Sedimentation rate    Standing Status:   Future    Number of Occurrences:   1    Standing Expiration Date:   06/14/2024   Angiotensin converting enzyme    Standing Status:   Future    Number of Occurrences:   1    Standing Expiration Date:   06/14/2024    FOLLOW-UP: Labs today Phone visit with Dr Candise Che in 2 weeks   The total time spent in the appointment was 60 minutes* .  All of the patient's questions were answered with apparent satisfaction. The patient knows to call the clinic with any problems, questions or concerns.   Wyvonnia Lora MD MS AAHIVMS [facility] Hematology/Oncology [facility]  .*Total Encounter Time as defined by the Centers for Medicare and Medicaid Services includes, in addition to the face-to-face time of a patient visit (documented in the note above) non-face-to-face time: obtaining and reviewing outside history, ordering and reviewing medications, tests  or procedures, care coordination (communications with other health care professionals or caregivers) and documentation in the medical record.    I,Mitra Faeizi,acting as a Neurosurgeon for Wyvonnia Lora, MD.,have documented all relevant documentation on the behalf of Wyvonnia Lora, MD,as directed by  Wyvonnia Lora, MD while in the presence of Wyvonnia Lora, MD.  .I have reviewed the above documentation for accuracy and completeness, and I agree with the above. Johney Maine MD

## 2023-06-16 LAB — ANGIOTENSIN CONVERTING ENZYME: Angiotensin-Converting Enzyme: 65 U/L (ref 14–82)

## 2023-06-18 LAB — KAPPA/LAMBDA LIGHT CHAINS
Kappa free light chain: 11.3 mg/L (ref 3.3–19.4)
Kappa, lambda light chain ratio: 1.22 (ref 0.26–1.65)
Lambda free light chains: 9.3 mg/L (ref 5.7–26.3)

## 2023-06-19 LAB — MULTIPLE MYELOMA PANEL, SERUM
Albumin SerPl Elph-Mcnc: 3.9 g/dL (ref 2.9–4.4)
Albumin/Glob SerPl: 1.6 (ref 0.7–1.7)
Alpha 1: 0.3 g/dL (ref 0.0–0.4)
Alpha2 Glob SerPl Elph-Mcnc: 0.8 g/dL (ref 0.4–1.0)
B-Globulin SerPl Elph-Mcnc: 0.9 g/dL (ref 0.7–1.3)
Gamma Glob SerPl Elph-Mcnc: 0.6 g/dL (ref 0.4–1.8)
Globulin, Total: 2.5 g/dL (ref 2.2–3.9)
IgA: 86 mg/dL (ref 64–422)
IgG (Immunoglobin G), Serum: 616 mg/dL (ref 586–1602)
IgM (Immunoglobulin M), Srm: 42 mg/dL (ref 26–217)
Total Protein ELP: 6.4 g/dL (ref 6.0–8.5)

## 2023-06-21 LAB — MYELOID PANEL NGS

## 2023-06-22 LAB — MISC LABCORP TEST (SEND OUT): Labcorp test code: 6072

## 2023-07-11 ENCOUNTER — Telehealth: Payer: Self-pay | Admitting: Hematology

## 2023-07-11 NOTE — Telephone Encounter (Signed)
Spoke with patient confirming upcoming appointment  

## 2023-07-12 ENCOUNTER — Inpatient Hospital Stay: Payer: Medicare Other | Attending: Hematology | Admitting: Hematology

## 2023-07-12 DIAGNOSIS — D72829 Elevated white blood cell count, unspecified: Secondary | ICD-10-CM | POA: Insufficient documentation

## 2023-07-12 DIAGNOSIS — D72828 Other elevated white blood cell count: Secondary | ICD-10-CM

## 2023-07-12 DIAGNOSIS — D72821 Monocytosis (symptomatic): Secondary | ICD-10-CM | POA: Diagnosis not present

## 2023-07-12 NOTE — Progress Notes (Signed)
HEMATOLOGY/ONCOLOGY CONSULTATION NOTE  Date of Service: 07/12/2023  Patient Care Team: Elias Else, MD (Inactive) as PCP - General (Family Medicine)  CHIEF COMPLAINTS/PURPOSE OF CONSULTATION:  Evaluation and management of leukocytosis  HISTORY OF PRESENTING ILLNESS:   Tracy Young is a wonderful 77 y.o. female who has been referred to [place] by Aliene Beams, MD for evaluation and management of leukocytosis.   He was seen by Malachy Mood, MD on 03/31/2022 for leukocytosis with predominant neutropenia. He suspected that her leukocytosis was a reactive process, secondary to her asthma,allergy, and infections including bronchitis.   Today, she is accompanied by her husband. She reports that she was infected with COVID-19 in October 2022, and her WBCs had been consistently elevated since then. Her infection caused her to be hospitalized for 11 days and she was discharged with oxygen. Her husband reports that x-ray at the ER showed findings of COVID-19 and viral pneumonia. 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 She does not require home oxygen at this time. Patient had previously received the original two COVID-19 vaccines and COVID-19 booster vaccine.   Patient has endorsed balance issues and dizziness since her COVID-19 infection. She has had 6-7 falls within 3 months. Patient did have a head injury with contact to concrete on one occasion and her husband reports that there were no concerns for concussion based on scans. Since involvement with vestibular therapy, her balance issues have improved. She does regularly use a rolling walker inside the home at night.   She reports having frequent intermittent UTIs occurring every 4-5 months. She reports that she did have frequent UTIs in her 56s and 75s as well. Patient notes that she was previously put on a low-grade antibiotic for a year by a urologist and she did not have UTIs while on it. Her husband reports  that her urine culture was negative the last time it was checked.  Patient reports having frequent bronchitis, sinus infections, and respiratory infections for several years. She notes that during her preschool-age she did endorse bronchitis frequently. Patient continues to regularly use an inhaler. She does not take calcium tablets at this time.   Patient reports previous constipation lasting 1 week. She notes splashing when passing stools. Her bowel habits have been normal over the last month. Her husband notes that certain medications that she is taking may have caused her previous constipation.   She was previously on Remeron for menopausal symptoms. Patient began taking it at age 7 and stopped at age 72 in 71. She notes that she was hospitalized potentially due to an issue caused by her estrogen.  Her objective exercise tolerance has returned to baseline. She is able to use an exercise bike for 30 minutes and lift weights for 15 minutes regularly.   She complains of occasional sudden lower Young spasms, causing leg weakness when walking, which eventually resolves. 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   She does endorse fatigue and bruises easily in her arms.. No unresolved dental issues, unusual rashes, unexplained painful swollen joints, unexplained new bone pain, skin rashes, abdominal pain, leg swelling, new lumps/bumps, or enlarged liver/spleen. Patient reports some neuropathy.  Patient is a never smoker. She has received second-hand exposure from her grandfather who raised her during her childhood. 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 She denies any other chemical exposure.   Patient denies having any polycystic  ovarian syndrome, uterine prolapse, kidney stones, recent surgeries, IBS, urinary incompeteance, cystoceles, uterine prolapse, or atrophic vaginitis. No fhx of autoimmune conditions such as lupus,  RA, or thyroid issues. No fhx of immune deficiency syndromes. Patient has one child.   She did previously have double knee replacement surgery in 2012 and takes antibiotics before dental procedures.  INTERVAL HISTORY:  Tracy Young is a 77 y.o. female here for continued evaluation and management of leukocytosis. She was seen by me on 06/15/2023 and complained of post-COVID syndrome and balance issues and dizziness since her COVID-19 infection. She also reported frequent falls with head injury, frequent intermittent UTIs, frequent bronchitis, sinus infections, and respiratory infections, occasional sudden lower Young spasms causing leg weakness, fatigue, easily bruising in upper extremities, and neuropathy.   I connected with Tracy Young on 07/12/23 at 12:00 PM EDT by telephone visit and verified that I am speaking with the correct person using two identifiers.   I discussed the limitations, risks, security and privacy concerns of performing an evaluation and management service by telemedicine and the availability of in-person appointments. I also discussed with the patient that there may be a patient responsible charge related to this service. The patient expressed understanding and agreed to proceed.   Other persons participating in the visit and their role in the encounter: none   Patient's location: home  Provider's location: [facility]   Chief Complaint: Evaluation and management of leukocytosis     Today, she reports that she has been doing well overall since her last visit.   Patient has had 4 UTIs in the last 12 months. She was referred to Urology and a clear explanation for frequent infections was not determined. She does regularly take cranberry extract and vitamin C.   She denies any personal history or fhx of breast cancer.   Patient regularly has labs with her PCP, Dr. Nicholos Johns every month.   The results of her recent lab workup was discussed with her in detail.   MEDICAL HISTORY:   Past Medical History:  Diagnosis Date   Chronic kidney disease, stage 3a (HCC) 06/27/2021   COVID-19    GERD without esophagitis 06/27/2021   Hypertension    Mild intermittent asthma without complication 06/27/2021   Mixed hyperlipidemia 06/27/2021   Pneumonia     SURGICAL HISTORY: Past Surgical History:  Procedure Laterality Date   TOTAL KNEE ARTHROPLASTY      SOCIAL HISTORY: Social History   Socioeconomic History   Marital status: Married    Spouse name: Not on file   Number of children: 4   Years of education: Not on file   Highest education level: Not on file  Occupational History   Occupation: Retired Audiological scientist  Tobacco Use   Smoking status: Never    Passive exposure: Never   Smokeless tobacco: Never  Vaping Use   Vaping status: Never Used  Substance and Sexual Activity   Alcohol use: Never   Drug use: Never   Sexual activity: Not on file  Other Topics Concern   Not on file  Social History Narrative   Not on file   Social Determinants of Health   Financial Resource Strain: Not on file  Food Insecurity: Not on file  Transportation Needs: Not on file  Physical Activity: Not on file  Stress: Not on file  Social Connections: Not on file  Intimate Partner Violence: Not on file    FAMILY HISTORY: Family History  Problem Relation Age of Onset  Cancer Mother 36       pancreatic cancer   Heart attack Father    Heart disease Neg Hx    Breast cancer Neg Hx     ALLERGIES:  is allergic to iodine stainless [iodine] and doxycycline.  MEDICATIONS:  Current Outpatient Medications  Medication Sig Dispense Refill   albuterol (VENTOLIN HFA) 108 (90 Base) MCG/ACT inhaler Inhale 2 puffs into the lungs every 4 (four) hours as needed for wheezing or shortness of breath. 8.5 g 1   Ascorbic Acid (VITAMIN C) 1000 MG tablet Take 1,000 mg by mouth every morning.     buPROPion (WELLBUTRIN XL) 300 MG 24 hr tablet Take 300 mg by mouth daily.     Cholecalciferol  (VITAMIN D-3) 125 MCG (5000 UT) TABS Take 5,000 Units by mouth every morning.     Cyanocobalamin (VITAMIN B-12 PO) Take 1 tablet by mouth every morning.     DULoxetine (CYMBALTA) 60 MG capsule Take 60 mg by mouth at bedtime.     famotidine (PEPCID) 40 MG tablet Take 40 mg by mouth at bedtime.     losartan (COZAAR) 25 MG tablet TAKE ONE TABLET BY MOUTH EVERY MORNING AND TAKE ONE TABLET BY MOUTH EVERY NIGHT AT BEDTIME 180 tablet 0   Multiple Minerals-Vitamins (CALCIUM CITRATE-MAG-MINERALS) TABS Take 1 tablet by mouth every morning. Calcium Citrate plus magnesium and zinc     NYAMYC powder Apply 1 application topically daily. Apply to legs after showering     pantoprazole (PROTONIX) 40 MG tablet Take 40 mg by mouth every morning.     raloxifene (EVISTA) 60 MG tablet Take 60 mg by mouth daily.     rosuvastatin (CRESTOR) 20 MG tablet TAKE 1 TABLET BY MOUTH DAILY 90 tablet 3   TRELEGY ELLIPTA 200-62.5-25 MCG/ACT AEPB INHALE 1 PUFF BY MOUTH DAILY 60 each 5   No current facility-administered medications for this visit.    REVIEW OF SYSTEMS:    10 Point review of Systems was done is negative except as noted above.   PHYSICAL EXAMINATION: TELEMEDICINE VISIT   LABORATORY DATA:  I have reviewed the data as listed  .    Latest Ref Rng & Units 06/15/2023    3:40 PM 03/31/2022   11:54 AM 07/03/2021    1:20 AM  CBC  WBC 4.0 - 10.5 K/uL 15.1  18.4  17.7   Hemoglobin 12.0 - 15.0 g/dL 59.5  63.8  75.6   Hematocrit 36.0 - 46.0 % 37.4  34.5  33.5   Platelets 150 - 400 K/uL 278  258  307     .    Latest Ref Rng & Units 06/15/2023    3:40 PM 07/03/2021    1:20 AM 07/02/2021    2:10 AM  CMP  Glucose 70 - 99 mg/dL 96  433  295   BUN 8 - 23 mg/dL 18  17  17    Creatinine 0.44 - 1.00 mg/dL 1.88  4.16  6.06   Sodium 135 - 145 mmol/L 138  134  131   Potassium 3.5 - 5.1 mmol/L 4.2  4.2  4.3   Chloride 98 - 111 mmol/L 104  102  100   CO2 22 - 32 mmol/L 27  23  24    Calcium 8.9 - 10.3 mg/dL 30.1  8.8   8.8   Total Protein 6.5 - 8.1 g/dL 7.0   5.4   Total Bilirubin 0.3 - 1.2 mg/dL 0.3   0.7   Alkaline Phos 38 -  126 U/L 63   70   AST 15 - 41 U/L 17   24   ALT 0 - 44 U/L 16   41     CBC 05/11/2023:     BCR/ABL 04/01/2023:   RADIOGRAPHIC STUDIES: I have personally reviewed the radiological images as listed and agreed with the findings in the report. No results found.  ASSESSMENT & PLAN:   77 y.o. female with   Elevated WBCs, specifically increased neutrophils and persistent elevation of monocytes Recurrent UTIs every 4-6 months Frequent respiratory infections including bronchitis and sinusitis  PLAN:  -Discussed lab results from 06/15/2023 in detail with patient. CBC showed WBC of 15.1K, hemoglobin of 12.3, and platelets of 278K. -WBC have improved from previously being as high as nearly 28K in October 2022. She was also anemic at that time.  -within total WBCs, her neutrophils are predominantly elevated at 9.7K. There is elevation of monocytes as well. Discussed that elevated neutrophils are often related to a reactive process and monocytes can also be reactive from inflammation -other immature cells are not significantly concerning at this time -previous mild anemia which was potentially from a previous infection has resolved at this time.  -hemoglobin is normal -platelets are normal -no abnormal paraproteinemia  -recent myeloma labs were negative -K/L light chains are normal -sedimentation rate is normal -Angiotensin-converting enzymelevel normal -additional myeloid markers showed no signs of clonality in terms of a mutation in the bone marrow driving production of certain WBCs including monocytes. -discussed that her elevated WBCs may be reflective of a slow-moving bone marrow disorder or a reactive process.  -discussed potential proceeding options given that her WBC trend is not a fast-moving process and is not immediately concerning: Bone marrow biopsy for further  analysis Holding off on an invasive bone marrow biopsy procedure, which would be reasonable, and continuing to monitor -pt would prefer to monitor blood counts without considerations of a bone marrow biopsy at this time -discussed that if there are any changes to her blood counts, this would be a stronger indication to get a bone marrow biopsy to analyze further -discussed option of consideration of premarin vaginal cream to address recurrent UTIs, which may help by thickening the estrogen-dependent lining -there may be a role to evaluate for any concern for urinary retention which could be a risk factor for UTI -discussed that sometimes, if it is not possible to be on antibiotics chronically, there is an option to take a single-dose antibiotic after sexual intercourse to prevent UTIs -there may be a role for higher dose vitamin C to keep the urine acidic -answered all of patient's questions in detail.    FOLLOW-UP: Labs today Phone visit with Dr Candise Che in 2 weeks   The total time spent in the appointment was 20 minutes* .  All of the patient's questions were answered with apparent satisfaction. The patient knows to call the clinic with any problems, questions or concerns.   Wyvonnia Lora MD MS AAHIVMS [facility] Hematology/Oncology Physician [facility]  .*Total Encounter Time as defined by the Centers for Medicare and Medicaid Services includes, in addition to the face-to-face time of a patient visit (documented in the note above) non-face-to-face time: obtaining and reviewing outside history, ordering and reviewing medications, tests or procedures, care coordination (communications with other health care professionals or caregivers) and documentation in the medical record.    I,Mitra Faeizi,acting as a Neurosurgeon for Wyvonnia Lora, MD.,have documented all relevant documentation on the behalf of Gautam  Candise Che, MD,as directed by  Wyvonnia Lora, MD while in the presence of Wyvonnia Lora,  MD.  .I have reviewed the above documentation for accuracy and completeness, and I agree with the above. Johney Maine MD

## 2023-07-25 ENCOUNTER — Telehealth: Payer: Medicare Other | Admitting: Hematology

## 2023-08-12 ENCOUNTER — Other Ambulatory Visit: Payer: Self-pay | Admitting: Cardiovascular Disease

## 2023-08-28 ENCOUNTER — Ambulatory Visit
Admission: RE | Admit: 2023-08-28 | Discharge: 2023-08-28 | Disposition: A | Discharge status: Home / Self Care | Payer: Medicare Other | Source: Ambulatory Visit | Attending: Family Medicine | Admitting: Family Medicine

## 2023-08-28 DIAGNOSIS — M8580 Other specified disorders of bone density and structure, unspecified site: Secondary | ICD-10-CM

## 2023-09-20 ENCOUNTER — Other Ambulatory Visit: Payer: Self-pay | Admitting: Pulmonary Disease

## 2023-09-20 DIAGNOSIS — J849 Interstitial pulmonary disease, unspecified: Secondary | ICD-10-CM

## 2023-09-21 ENCOUNTER — Telehealth: Payer: Medicare Other | Admitting: Pulmonary Disease

## 2023-09-27 ENCOUNTER — Telehealth: Payer: Self-pay | Admitting: Pulmonary Disease

## 2023-09-27 ENCOUNTER — Other Ambulatory Visit: Payer: Medicare Other

## 2023-09-27 NOTE — Telephone Encounter (Signed)
 PT has a double Referral for same Order. Order# [id number] needs to be canceled.

## 2023-10-01 ENCOUNTER — Telehealth: Payer: Medicare Other | Admitting: Pulmonary Disease

## 2023-10-25 ENCOUNTER — Other Ambulatory Visit: Payer: Self-pay | Admitting: Pulmonary Disease

## 2023-10-25 DIAGNOSIS — J849 Interstitial pulmonary disease, unspecified: Secondary | ICD-10-CM

## 2023-10-26 ENCOUNTER — Ambulatory Visit: Payer: Medicare Other | Admitting: Pulmonary Disease

## 2023-10-26 DIAGNOSIS — J849 Interstitial pulmonary disease, unspecified: Secondary | ICD-10-CM

## 2023-10-26 LAB — PULMONARY FUNCTION TEST
DL/VA % pred: 99 %
DL/VA: 4.06 ml/min/mmHg/L
DLCO cor % pred: 61 %
DLCO cor: 12.02 ml/min/mmHg
DLCO unc % pred: 61 %
DLCO unc: 12.02 ml/min/mmHg
FEF 25-75 Post: 4.44 L/s
FEF 25-75 Pre: 3.04 L/s
FEF2575-%Change-Post: 45 %
FEF2575-%Pred-Post: 262 %
FEF2575-%Pred-Pre: 180 %
FEV1-%Change-Post: 5 %
FEV1-%Pred-Post: 95 %
FEV1-%Pred-Pre: 90 %
FEV1-Post: 2.06 L
FEV1-Pre: 1.96 L
FEV1FVC-%Change-Post: 0 %
FEV1FVC-%Pred-Pre: 122 %
FEV6-%Change-Post: 5 %
FEV6-%Pred-Post: 81 %
FEV6-%Pred-Pre: 77 %
FEV6-Post: 2.24 L
FEV6-Pre: 2.13 L
FEV6FVC-%Pred-Post: 105 %
FEV6FVC-%Pred-Pre: 105 %
FVC-%Change-Post: 5 %
FVC-%Pred-Post: 77 %
FVC-%Pred-Pre: 74 %
FVC-Post: 2.24 L
FVC-Pre: 2.13 L
Post FEV1/FVC ratio: 92 %
Post FEV6/FVC ratio: 100 %
Pre FEV1/FVC ratio: 92 %
Pre FEV6/FVC Ratio: 100 %
RV % pred: 84 %
RV: 1.95 L
TLC % pred: 88 %
TLC: 4.52 L

## 2023-10-26 NOTE — Progress Notes (Signed)
 Full PFT performed today.

## 2023-10-26 NOTE — Patient Instructions (Signed)
 Full PFT performed today.

## 2023-10-28 NOTE — Progress Notes (Signed)
 Cardiology Office Note:  .   Date:  10/29/2023  ID:  Efrain, Glatfelter 1945-11-15, MRN [id number] PCP: Vidal Graven, MD  [facility] Providers Cardiologist:  None { History of Present Illness: .    Chief Complaint  Patient presents with   Follow-up    1 year.   Shortness of Breath    ASPASIA FRIEDBERG is a 78 y.o. female with history of ILD, coronary calcium  who presents for follow-up.    History of Present Illness   JLAH BALTHAZAR is a 78 year old female with hypertension, interstitial lung disease, coronary calcifications, and hyperlipidemia who presents for follow-up.  Her LDL cholesterol is currently 54, and she is on Crestor . Cholesterol levels have been stable. Blood pressure was 96/58 during her annual physical in mid-November, associated with dizziness. She adjusted her losartan  intake by omitting the night dose, but later resumed it. She takes losartan  25 mg twice daily. Despite the adjustments, she continues to experience dizziness, especially upon standing, which subsides after a few steps. She drinks water regularly and exercises on a bicycle three to four days a week for 30 minutes.  She experiences dizziness upon standing, which improves after walking a few steps. Symptoms have improved with vestibular therapy, but dizziness persists when standing, subsiding after a few steps. She does not wear compression stockings or high socks. She underwent vestibular therapy for dizziness after her regular rehab in October 2023, which helped alleviate her symptoms. She occasionally performs vestibular exercises at home.  She had COVID-19 in December, leading to the postponement of her pulmonary function test. Her last CT scan a year ago showed no change in her interstitial lung disease, and her pulmonary function test results have significantly improved since then. She did not undergo pulmonary rehab but participated in a regular exercise program at [facility], which involved  movement and exercise without breathing exercises. No chest pain and no requirement for oxygen  supplementation.  She has lost 15 pounds over the past year, dropping from 176 to 161 pounds, attributed to regular exercise and sensible eating. She follows a diet with small portions and regular meals, including high protein foods like meat and beans.  Her EKG shows sinus tachycardia, which is normal for her. No rapid heartbeats or chest pain.          Problem List Asthma/ILD -Moderate restrictive lung disease, severe diffusion defect Coronary calcifications on Chest CT -mild LAD/RCA 3. HLD -T chol 141, HDL 61, LDL 54, TG 155 4. HTN    ROS: All other ROS reviewed and negative. Pertinent positives noted in the HPI.     Studies Reviewed: Aaron Aas   EKG Interpretation Date/Time:  Monday October 29 2023 14:18:31 EST Ventricular Rate:  106 PR Interval:  144 QRS Duration:  80 QT Interval:  338 QTC Calculation: 448 R Axis:   62  Text Interpretation: Sinus tachycardia When compared with ECG of 14-Aug-2021 13:57, Confirmed by Jackquelyn Mass [id number]) on 10/29/2023 2:24:30 PM  TTE 10/04/2022  1. Left ventricular ejection fraction, by estimation, is 60 to 65%. The  left ventricle has normal function. The left ventricle has no regional  wall motion abnormalities. Left ventricular diastolic parameters were  normal. The average left ventricular  global longitudinal strain is -22.8 %. The global longitudinal strain is  normal.   2. Right ventricular systolic function is normal. The right ventricular  size is normal. Tricuspid regurgitation signal is inadequate for assessing  PA pressure.   3. The mitral  valve is normal in structure. No evidence of mitral valve  regurgitation. No evidence of mitral stenosis.   4. The aortic valve is tricuspid. Aortic valve regurgitation is not  visualized. No aortic stenosis is present.   5. The inferior vena cava is normal in size with greater than 50%  respiratory  variability, suggesting right atrial pressure of 3 mmHg.    Physical Exam:   VS:  BP 116/64 (BP Location: Left Arm, Patient Position: Sitting, Cuff Size: Normal)   Pulse (!) 106   Ht 5\' 4"  (1.626 m)   Wt 161 lb (73 kg)   BMI 27.64 kg/m    Wt Readings from Last 3 Encounters:  10/29/23 161 lb (73 kg)  06/15/23 164 lb 12.8 oz (74.8 kg)  09/27/22 176 lb (79.8 kg)    GEN: Well nourished, well developed in no acute distress NECK: No JVD; No carotid bruits CARDIAC: RRR, no murmurs, rubs, gallops RESPIRATORY:  Clear to auscultation without rales, wheezing or rhonchi  ABDOMEN: Soft, non-tender, non-distended EXTREMITIES:  No edema; No deformity  ASSESSMENT AND PLAN: .   Assessment and Plan    Orthostatic Hypotension Persistent dizziness, particularly upon standing. Blood pressure readings variable but generally within acceptable range on current regimen of Losartan  25mg  BID. No changes recommended to antihypertensive therapy at this time. -Continue Losartan  25mg  BID. -Trial of compression stockings recommended to assist with orthostatic symptoms. -Ensure adequate hydration and regular meals.  Sinus tachycardia -stable. 2/2 ILD. No arrhythmias.   Hyperlipidemia Coronary calcifications  Well controlled with LDL of 54 on current regimen of Crestor . -Continue Crestor .  General Health Maintenance -Continue regular exercise as tolerated. -Plan for follow-up in 1 year.              Follow-up: Return in about 1 year (around 10/28/2024).   Signed, Gigi Kyle. Rolm Clos, MD, [facility]  [facility]  [address], [address] [place], [place] [address] [phone]  3:05 PM

## 2023-10-29 ENCOUNTER — Ambulatory Visit: Payer: Medicare Other | Attending: Cardiovascular Disease | Admitting: Cardiovascular Disease

## 2023-10-29 ENCOUNTER — Encounter: Payer: Self-pay | Admitting: Cardiovascular Disease

## 2023-10-29 VITALS — BP 116/64 | HR 106 | Ht 64.0 in | Wt 161.0 lb

## 2023-10-29 DIAGNOSIS — R000 Tachycardia, unspecified: Secondary | ICD-10-CM

## 2023-10-29 DIAGNOSIS — I2510 Atherosclerotic heart disease of native coronary artery without angina pectoris: Secondary | ICD-10-CM

## 2023-10-29 DIAGNOSIS — I10 Essential (primary) hypertension: Secondary | ICD-10-CM | POA: Diagnosis not present

## 2023-10-29 DIAGNOSIS — E782 Mixed hyperlipidemia: Secondary | ICD-10-CM | POA: Diagnosis not present

## 2023-10-29 DIAGNOSIS — R42 Dizziness and giddiness: Secondary | ICD-10-CM

## 2023-10-29 NOTE — Patient Instructions (Addendum)
 Medication Instructions:  Your physician recommends that you continue on your current medications as directed. Please refer to the Current Medication list given to you today.   *If you need a refill on your cardiac medications before your next appointment, please call your pharmacy*   Lab Work: NONE    If you have labs (blood work) drawn today and your tests are completely normal, you will receive your results only by: MyChart Message (if you have MyChart) OR A paper copy in the mail If you have any lab test that is abnormal or we need to change your treatment, we will call you to review the results.   Testing/Procedures: NONE    Follow-Up: At [facility], you and your health needs are our priority.  As part of our continuing mission to provide you with exceptional heart care, we have created designated Provider Care Teams.  These Care Teams include your primary Cardiologist (physician) and Advanced Practice Providers (APPs -  Physician Assistants and Nurse Practitioners) who all work together to provide you with the care you need, when you need it.  We recommend signing up for the patient portal called "MyChart".  Sign up information is provided on this After Visit Summary.  MyChart is used to connect with patients for Virtual Visits (Telemedicine).  Patients are able to view lab/test results, encounter notes, upcoming appointments, etc.  Non-urgent messages can be sent to your provider as well.   To learn more about what you can do with MyChart, go to [email or web address].    Your next appointment:   1 year(s)  The format for your next appointment:   In Person  Provider:   Lennie Odor, MD     Other Instructions

## 2023-11-08 ENCOUNTER — Other Ambulatory Visit: Payer: Self-pay | Admitting: Cardiovascular Disease

## 2023-11-08 ENCOUNTER — Other Ambulatory Visit: Payer: Medicare Other

## 2023-11-09 ENCOUNTER — Ambulatory Visit
Admission: RE | Admit: 2023-11-09 | Discharge: 2023-11-09 | Disposition: A | Discharge status: Home / Self Care | Payer: Medicare Other | Source: Ambulatory Visit | Attending: Pulmonary Disease | Admitting: Pulmonary Disease

## 2023-11-09 DIAGNOSIS — J849 Interstitial pulmonary disease, unspecified: Secondary | ICD-10-CM

## 2023-11-14 ENCOUNTER — Other Ambulatory Visit: Payer: Self-pay | Admitting: Pulmonary Disease

## 2023-11-21 ENCOUNTER — Telehealth (INDEPENDENT_AMBULATORY_CARE_PROVIDER_SITE_OTHER): Payer: Medicare Other | Admitting: Pulmonary Disease

## 2023-11-21 ENCOUNTER — Encounter: Payer: Self-pay | Admitting: Pulmonary Disease

## 2023-11-21 VITALS — BP 134/75 | HR 103 | Ht 64.0 in | Wt 156.0 lb

## 2023-11-21 DIAGNOSIS — J849 Interstitial pulmonary disease, unspecified: Secondary | ICD-10-CM

## 2023-11-21 DIAGNOSIS — J45909 Unspecified asthma, uncomplicated: Secondary | ICD-10-CM

## 2023-11-21 DIAGNOSIS — U099 Post covid-19 condition, unspecified: Secondary | ICD-10-CM | POA: Diagnosis not present

## 2023-11-21 NOTE — Progress Notes (Signed)
 Tracy Young    [id number]    11/18/1945  Primary Care Physician:Reade, Molly Maduro, MD  Referring Physician: Elias Else, MD [phone] [address] [place],  [place] [address]  Virtual Visit via Video Note  I connected with Tracy Young on 11/21/23 at  1:15 PM EST by a video enabled telemedicine application and verified that I am speaking with the correct person using two identifiers.  Location: Patient: Home Provider: Pulmonary   I discussed the limitations of evaluation and management by telemedicine and the availability of in person appointments. The patient expressed understanding and agreed to proceed.  Chief complaint: Follow-up for Post COVID 78  HPI: 78 year old with history of hyperlipidemia,CKD stage IIIa, GERD, asthma She was hospitalized for acute COVID-19 pneumonia in October 2022.  Required high flow nasal cannula.  She was treated with steroids, baricitinib and gradually weaned down to 6 L oxygen on discharge.  She also got 10 days of prednisone on discharge Hospital course noted for elevated D-dimer with negative CT angiogram and lower extremity Dopplers for clot.  She was advised to use daily aspirin at discharge  Post discharge she is improved to 2 L oxygen but continues to have significant dyspnea on exertion, chronic cough which is nonproductive in nature.  No fevers or chills  Currently on Symbicort inhaler which she is using twice daily  Pets: No pets Occupation: Retired Airline pilot Exposures: No exposures. Smoking history: Non-smoker Travel history: No significant travel history Relevant family history: No family history of lung disease  Interim history: Doing well with the breathing Got COVID again in Dec 2025 but did not need hospitalization or any therapies. Continues to make improvements with regard to breathing.   Here for review of PFTs  Outpatient Encounter Medications as of 11/21/2023  Medication Sig   albuterol (VENTOLIN  HFA) 108 (90 Base) MCG/ACT inhaler Inhale 2 puffs into the lungs every 4 (four) hours as needed for wheezing or shortness of breath.   Ascorbic Acid (VITAMIN C) 1000 MG tablet Take 1,000 mg by mouth every morning.   buPROPion (WELLBUTRIN XL) 300 MG 24 hr tablet Take 300 mg by mouth daily.   Cholecalciferol (VITAMIN D-3) 125 MCG (5000 UT) TABS Take 5,000 Units by mouth every morning.   Cyanocobalamin (VITAMIN B-12 PO) Take 1 tablet by mouth every morning.   DULoxetine (CYMBALTA) 60 MG capsule Take 60 mg by mouth at bedtime.   famotidine (PEPCID) 40 MG tablet Take 40 mg by mouth at bedtime.   losartan (COZAAR) 25 MG tablet TAKE 1 TABLET BY MOUTH EVERY MORNING AND EVERY NIGHT AT BEDTIME   Multiple Minerals-Vitamins (CALCIUM CITRATE-MAG-MINERALS) TABS Take 1 tablet by mouth every morning. Calcium Citrate plus magnesium and zinc   NYAMYC powder Apply 1 application topically daily. Apply to legs after showering   pantoprazole (PROTONIX) 40 MG tablet Take 40 mg by mouth every morning.   PREMARIN vaginal cream Place 1 applicator vaginally at bedtime.   raloxifene (EVISTA) 60 MG tablet Take 60 mg by mouth daily.   rosuvastatin (CRESTOR) 20 MG tablet TAKE 1 TABLET BY MOUTH DAILY   TRELEGY ELLIPTA 200-62.5-25 MCG/ACT AEPB INHALE 1 PUFF BY MOUTH DAILY   No facility-administered encounter medications on file as of 11/21/2023.    Physical Exam: Virtual visit  Data Reviewed: Imaging: CTA 06/28/2021-no pulmonary embolism, bilateral confluent groundglass opacities CTA 07/03/2021-no pulmonary embolism, bilateral groundglass opacities with mild improvement High-resolution CT 08/08/2021-evolving interstitial lung disease and probable  UIP pattern High-resolution CT 09/25/2022-basilar predominant lung disease 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  Stable compared to 2022 High-res CT 11/09/2023-stable pattern of pulmonary fibrosis, probable UIP pattern I have reviewed the images personally.  PFTs: 09/28/2021 FVC 2.09 [90%],  FEV1 1.90 [85%], F/F 91, TLC 3.27 [63%], DLCO 8.61 [43%] Moderate restriction, severe diffusion defect  09/27/2022 FVC 2.02 [69%], FEV1 1.92 [87%], F/F95, TLC 3.75 [73%], DLCO 11.87 [60%] Mild restriction, moderate diffusion defect  10/26/2023 FVC 2.24 [77%], FEV1 2.06 [95%], F/F92, TLC 4.52 [88%], DLCO 12.02 [61%] Moderate diffusion defect  Labs:   Cardiac: Echocardiogram 09/13/2021 LVEF 65-70%, normal PA systolic pressure, RV size and function is normal.   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  DuoNebs as needed Off supplemental oxygen though she may need a few liters oxygen while exercising in rehab as her sats are dipping down into the low 80s  CT shows stable pattern of pulmonary fibrosis and present improvement in PFTs which is encouraging Follow-up in 1 year  Plan/Recommendations: Continue trelegy  I discussed the assessment and treatment plan with the patient. The patient was provided an opportunity to ask questions and all were answered. The patient agreed with the plan and demonstrated an understanding of the instructions.   The patient was advised to call back or seek an in-person evaluation if the symptoms worsen or if the condition fails to improve as anticipated.  Chilton Greathouse MD [facility] 11/21/2023, 1:07 PM  CC: Elias Else, MD

## 2023-12-10 NOTE — Addendum Note (Signed)
 Addended byChilton Greathouse on: 12/10/2023 09:13 AM   Modules accepted: Orders

## 2024-02-18 ENCOUNTER — Other Ambulatory Visit: Payer: Self-pay | Admitting: Family Medicine

## 2024-02-18 DIAGNOSIS — Z0000 Encounter for general adult medical examination without abnormal findings: Secondary | ICD-10-CM

## 2024-03-01 ENCOUNTER — Other Ambulatory Visit: Payer: Self-pay | Admitting: Cardiovascular Disease

## 2024-03-06 ENCOUNTER — Ambulatory Visit
Admission: RE | Admit: 2024-03-06 | Discharge: 2024-03-06 | Disposition: A | Discharge status: Home / Self Care | Source: Ambulatory Visit | Attending: Family Medicine | Admitting: Family Medicine

## 2024-03-06 DIAGNOSIS — Z0000 Encounter for general adult medical examination without abnormal findings: Secondary | ICD-10-CM

## 2024-05-03 ENCOUNTER — Other Ambulatory Visit: Payer: Self-pay | Admitting: Cardiovascular Disease

## 2024-05-23 ENCOUNTER — Telehealth: Payer: Self-pay

## 2024-05-23 NOTE — Telephone Encounter (Signed)
 Copied from CRM [id number]. Topic: Clinical - Medical Advice >> May 23, 2024  8:39 AM Russell PARAS wrote: Reason for CRM:   Pt and husband tested positive for COVID on 9/4. She is wanting to know if there is a medication that can be prescribed or any home remedies they can utilize to ease their symptoms.   Requested call back   # [phone]   Please advise Dr. Theophilus

## 2024-05-26 NOTE — Telephone Encounter (Signed)
 I called and discussed with patient.  She has been having symptoms for 2 weeks and tested positive for COVID on 9/4 They have been staying well-hydrated, taking over-the-counter medication and symptoms are improving Continue current care.  Nothing further needed for

## 2024-05-28 ENCOUNTER — Other Ambulatory Visit: Payer: Self-pay | Admitting: Pulmonary Disease

## 2024-11-03 ENCOUNTER — Ambulatory Visit: Admitting: Cardiovascular Disease

## 2024-11-04 ENCOUNTER — Encounter (INDEPENDENT_AMBULATORY_CARE_PROVIDER_SITE_OTHER): Payer: Medicare Other | Admitting: Ophthalmology

## 2024-11-12 ENCOUNTER — Other Ambulatory Visit
